# Patient Record
Sex: Female | Born: 1965
Health system: Southern US, Community
[De-identification: ages and names within clinical notes are randomized; demographics above are authoritative.]

## PROBLEM LIST (undated history)

## (undated) DIAGNOSIS — R0683 Snoring: Secondary | ICD-10-CM

## (undated) DIAGNOSIS — G510 Bell's palsy: Secondary | ICD-10-CM

## (undated) DIAGNOSIS — M069 Rheumatoid arthritis, unspecified: Secondary | ICD-10-CM

## (undated) DIAGNOSIS — G4730 Sleep apnea, unspecified: Secondary | ICD-10-CM

## (undated) DIAGNOSIS — M1990 Unspecified osteoarthritis, unspecified site: Secondary | ICD-10-CM

## (undated) DIAGNOSIS — B019 Varicella without complication: Secondary | ICD-10-CM

## (undated) DIAGNOSIS — I493 Ventricular premature depolarization: Secondary | ICD-10-CM

## (undated) DIAGNOSIS — I10 Essential (primary) hypertension: Secondary | ICD-10-CM

## (undated) HISTORY — DX: Varicella without complication: B01.9

## (undated) HISTORY — DX: Unspecified osteoarthritis, unspecified site: M19.90

## (undated) HISTORY — PX: FOOT SURGERY: SHX648

## (undated) HISTORY — DX: Ventricular premature depolarization: I49.3

## (undated) HISTORY — DX: Bell's palsy: G51.0

## (undated) HISTORY — DX: Essential (primary) hypertension: I10

## (undated) HISTORY — DX: Sleep apnea, unspecified: G47.30

## (undated) HISTORY — DX: Snoring: R06.83

## (undated) HISTORY — PX: BREAST BIOPSY: SHX20

## (undated) HISTORY — DX: Rheumatoid arthritis, unspecified: M06.9

---

## 2004-10-25 ENCOUNTER — Encounter: Admission: RE | Admit: 2004-10-25 | Discharge: 2004-10-25 | Payer: Self-pay | Admitting: Obstetrics and Gynecology

## 2005-10-30 ENCOUNTER — Encounter: Admission: RE | Admit: 2005-10-30 | Discharge: 2005-10-30 | Payer: Self-pay | Admitting: Obstetrics and Gynecology

## 2006-11-03 ENCOUNTER — Encounter: Admission: RE | Admit: 2006-11-03 | Discharge: 2006-11-03 | Payer: Self-pay | Admitting: Obstetrics and Gynecology

## 2007-09-08 ENCOUNTER — Ambulatory Visit (HOSPITAL_BASED_OUTPATIENT_CLINIC_OR_DEPARTMENT_OTHER): Admission: RE | Admit: 2007-09-08 | Discharge: 2007-09-08 | Payer: Self-pay | Admitting: Obstetrics and Gynecology

## 2007-11-04 ENCOUNTER — Ambulatory Visit (HOSPITAL_BASED_OUTPATIENT_CLINIC_OR_DEPARTMENT_OTHER): Admission: RE | Admit: 2007-11-04 | Discharge: 2007-11-04 | Payer: Self-pay | Admitting: Obstetrics and Gynecology

## 2008-11-16 ENCOUNTER — Ambulatory Visit: Payer: Self-pay | Admitting: Radiology

## 2008-11-16 ENCOUNTER — Ambulatory Visit (HOSPITAL_BASED_OUTPATIENT_CLINIC_OR_DEPARTMENT_OTHER): Admission: RE | Admit: 2008-11-16 | Discharge: 2008-11-16 | Payer: Self-pay | Admitting: Obstetrics and Gynecology

## 2009-11-23 ENCOUNTER — Ambulatory Visit (HOSPITAL_BASED_OUTPATIENT_CLINIC_OR_DEPARTMENT_OTHER): Admission: RE | Admit: 2009-11-23 | Discharge: 2009-11-23 | Payer: Self-pay | Admitting: Obstetrics and Gynecology

## 2009-11-23 ENCOUNTER — Ambulatory Visit: Payer: Self-pay | Admitting: Diagnostic Radiology

## 2010-08-31 ENCOUNTER — Other Ambulatory Visit: Payer: Self-pay | Admitting: Obstetrics and Gynecology

## 2010-08-31 ENCOUNTER — Other Ambulatory Visit (HOSPITAL_COMMUNITY)
Admission: RE | Admit: 2010-08-31 | Discharge: 2010-08-31 | Disposition: A | Discharge status: Home / Self Care | Payer: 59 | Source: Ambulatory Visit | Attending: Obstetrics and Gynecology | Admitting: Obstetrics and Gynecology

## 2010-08-31 DIAGNOSIS — Z1231 Encounter for screening mammogram for malignant neoplasm of breast: Secondary | ICD-10-CM

## 2010-08-31 DIAGNOSIS — Z1159 Encounter for screening for other viral diseases: Secondary | ICD-10-CM | POA: Insufficient documentation

## 2010-08-31 DIAGNOSIS — Z01419 Encounter for gynecological examination (general) (routine) without abnormal findings: Secondary | ICD-10-CM | POA: Insufficient documentation

## 2010-11-26 ENCOUNTER — Ambulatory Visit (HOSPITAL_BASED_OUTPATIENT_CLINIC_OR_DEPARTMENT_OTHER)
Admission: RE | Admit: 2010-11-26 | Discharge: 2010-11-26 | Disposition: A | Discharge status: Home / Self Care | Payer: 59 | Source: Ambulatory Visit | Attending: Obstetrics and Gynecology | Admitting: Obstetrics and Gynecology

## 2010-11-26 DIAGNOSIS — Z1231 Encounter for screening mammogram for malignant neoplasm of breast: Secondary | ICD-10-CM

## 2016-08-07 ENCOUNTER — Encounter: Payer: Self-pay | Admitting: Family Medicine

## 2016-08-07 ENCOUNTER — Ambulatory Visit (INDEPENDENT_AMBULATORY_CARE_PROVIDER_SITE_OTHER): Payer: 59 | Admitting: Family Medicine

## 2016-08-07 VITALS — BP 120/70 | HR 72 | Resp 12 | Ht 62.0 in | Wt 119.2 lb

## 2016-08-07 DIAGNOSIS — M069 Rheumatoid arthritis, unspecified: Secondary | ICD-10-CM

## 2016-08-07 DIAGNOSIS — M79605 Pain in left leg: Secondary | ICD-10-CM | POA: Diagnosis not present

## 2016-08-07 DIAGNOSIS — B370 Candidal stomatitis: Secondary | ICD-10-CM | POA: Diagnosis not present

## 2016-08-07 DIAGNOSIS — R682 Dry mouth, unspecified: Secondary | ICD-10-CM | POA: Diagnosis not present

## 2016-08-07 DIAGNOSIS — I10 Essential (primary) hypertension: Secondary | ICD-10-CM

## 2016-08-07 LAB — BASIC METABOLIC PANEL
BUN: 21 mg/dL (ref 6–23)
CHLORIDE: 103 meq/L (ref 96–112)
CO2: 28 meq/L (ref 19–32)
Calcium: 10 mg/dL (ref 8.4–10.5)
Creatinine, Ser: 0.56 mg/dL (ref 0.40–1.20)
GFR: 120.92 mL/min (ref >60.00)
Glucose, Bld: 101 mg/dL — ABNORMAL HIGH (ref 70–99)
POTASSIUM: 4.2 meq/L (ref 3.5–5.1)
SODIUM: 138 meq/L (ref 135–145)

## 2016-08-07 MED ORDER — AMLODIPINE BESYLATE 5 MG PO TABS: 5.0000 mg | ORAL_TABLET | Freq: Every day | ORAL | 0 refills | Status: DC

## 2016-08-07 MED ORDER — NYSTATIN 100000 UNIT/ML MT SUSP
2.0000 mL | Freq: Four times a day (QID) | OROMUCOSAL | 1 refills | Status: AC
Start: 2016-08-07 — End: 2016-09-06

## 2016-08-07 NOTE — Progress Notes (Signed)
HPI:   [name] is a 51 y.o. female, who is here today to establish care.  Former PCP: Dr Manson Passey in [place]. She moved to the area about 3 weeks ago.  Last preventive routine visit: 05/2016, gyn routine exam.  Chronic medical problems: RA and HTN. She saw rheuma last in 05/2016 and had labs done. Pending trying to establish with her former rheuma here in [place], Dr Corliss Skains. Dx with RA at age 1, she is currently on Methotrexate and Enbrel, she also takes Aleve as needed.  HTN Dx in 05/2016 by her former gyn, it was initially attributed to OCP's. According to patient, BP was rechecked after stopping OCPs, still elevated at 162/102. She was started on amlodipine 5 mg daily. Denies severe/frequent headache, visual changes, chest pain, dyspnea, palpitation, claudication, focal weakness, or edema. She is following a low salt diet.   She is not exercising regularly.   She is reporting negative renal and liver U/S ordered by former PCP due to new onset of HTN.  She has been under some stress. States that her mother was supposed to move with her but due to some medical issues she is still in [place] in a SNF.  Concerns today:   Dry mouth for about 2 months, which she attributed to Amlodipine. She denies associated dry eyes or vaginal mucosa, or ulcers/oral lesions. She states that she "always" has been a month breather. She denies associated nasal congestion, postnasal drainage, odynophagia, or dysphagia. She reports history of mildly enlarged thyroid gland, evaluated by ENT and negative workup that included thyroid imaging.   -2 weeks of calf achy at night,mild,better with changing positions. She denies extremity edema, erythema, numbness, tingling, or color changes. She has occasional lower back pain, no radiated, unchanged for years. She attributes primarily to sleeping on a different mattress. She denies any symptoms during the day.   Review of Systems    Constitutional: Negative for activity change, appetite change, fatigue, fever and unexpected weight change.  HENT: Negative for facial swelling, mouth sores, nosebleeds, sore throat, trouble swallowing and voice change.   Eyes: Negative for redness and visual disturbance.  Respiratory: Negative for cough, shortness of breath and wheezing.   Cardiovascular: Negative for chest pain, palpitations and leg swelling.  Gastrointestinal: Negative for abdominal pain, nausea and vomiting.       Negative for changes in bowel habits.  Endocrine: Negative for cold intolerance, heat intolerance, polydipsia, polyphagia and polyuria.  Genitourinary: Negative for decreased urine volume and hematuria.  Musculoskeletal: Positive for arthralgias and myalgias. Negative for gait problem and joint swelling.  Skin: Negative for color change and rash.  Neurological: Negative for syncope, weakness, numbness and headaches.  Hematological: Negative for adenopathy. Does not bruise/bleed easily.  Psychiatric/Behavioral: Negative for confusion and sleep disturbance. The patient is not nervous/anxious.     No current outpatient prescriptions on file prior to visit.   No current facility-administered medications on file prior to visit.     Past Medical History:  Diagnosis Date  . Arthritis    RA Dx at age 27  . Bell's palsy   . Chicken pox   . Hypertension    Not on File  Family History  Problem Relation Age of Onset  . Arthritis Mother   . Diabetes Neg Hx   . Hypertension Neg Hx   . Hyperlipidemia Neg Hx   . Stroke Neg Hx     Social History   Social History  .  Marital status: Married    Spouse name: N/A  . Number of children: N/A  . Years of education: N/A   Social History Main Topics  . Smoking status: Never Smoker  . Smokeless tobacco: Never Used  . Alcohol use Yes  . Drug use: No  . Sexual activity: Not Asked   Other Topics Concern  . None   Social History Narrative  . None     Vitals:   08/07/16 1040  BP: 120/70  Pulse: 72  Resp: 12   Body mass index is 21.81 kg/m.   Physical Exam  Nursing note and vitals reviewed. Constitutional: She is oriented to person, place, and time. She appears well-developed. No distress.  HENT:  Head: Atraumatic.  Mouth/Throat: Oropharynx is clear and moist. Mucous membranes are dry.  Whitish areas on tongue and soft palate, mild erythema.  Eyes: Conjunctivae and EOM are normal. Pupils are equal, round, and reactive to light.  Neck: No tracheal deviation present. No thyroid mass and no thyromegaly (palpable) present.  Cardiovascular: Normal rate and regular rhythm.   No murmur heard. Pulses:      Dorsalis pedis pulses are 2+ on the right side, and 2+ on the left side.  Respiratory: Effort normal and breath sounds normal. No respiratory distress.  GI: Soft. She exhibits no mass. There is no hepatomegaly. There is no tenderness.  Musculoskeletal: She exhibits no edema or tenderness.       Lumbar back: She exhibits no tenderness and no bony tenderness.  No pain upon palpation of calves, no signs of synovitis.   Lymphadenopathy:    She has no cervical adenopathy.  Neurological: She is alert and oriented to person, place, and time. She has normal strength. Coordination normal.  Skin: Skin is warm. No erythema.  Psychiatric: She has a normal mood and affect.  Well groomed, good eye contact.     ASSESSMENT AND PLAN:   Ramon was seen today for establish care.  Diagnoses and all orders for this visit:   Oral thrush  Mild. Topical Nystatin recommended. Follow-up as needed.  -     nystatin (MYCOSTATIN) 100000 UNIT/ML suspension; Take 2 mLs (200,000 Units total) by mouth 4 (four) times daily.  Rheumatoid arthritis, involving unspecified site, unspecified rheumatoid factor presence [facility])  Hospital symptoms are well controlled, no changes in current management.  Referral for Dr Corliss Skains was placed today.  -      Ambulatory referral to Rheumatology  Hypertension, essential  Adequately controlled. No changes in current management.   We discussed some side effects of NSAIDs. Continue monitoring BP at home. DASH-low salt diet to continue. Eye exam current. F/U in 4 months, before if needed.  -     amLODipine (NORVASC) 5 MG tablet; Take 1 tablet (5 mg total) by mouth daily. -     Basic metabolic panel  Pain of left lower extremity  Examination today do not suggest a serious process, so monitor for now.  We discussed possible causes: Electrolyte abnormality, musculoskeletal, radicular, and RLS among some. Further recommendations will be given according to lab results.  Dry mouth, unspecified  Review some side effects of Amlodipine, explained to try mouth is not a common one. If symptom does not improve after oral thrush resolves, she could try decreasing Amlodipine to 2.5 mg and see if it helps. Adequate hydration. We discussed other possible etiologies, recommend also addressing problem with rheumatologist. Follow-up as needed.    Lyzbeth Genrich G. [place], MD  [facility]. [place]  office.

## 2016-08-07 NOTE — Patient Instructions (Signed)
A few things to remember from today's visit:   Oral thrush - Plan: nystatin (MYCOSTATIN) 100000 UNIT/ML suspension  Rheumatoid arthritis, involving unspecified site, unspecified rheumatoid factor presence (HCC) - Plan: methotrexate (RHEUMATREX) 2.5 MG tablet, etanercept (ENBREL) 50 MG/ML injection, Ambulatory referral to Rheumatology  Hypertension, essential - Plan: amLODipine (NORVASC) 5 MG tablet, Basic metabolic panel  Blood pressure goal for most people is less than 140/90.   Most recent cardiologists' recommendations recommend blood pressure at or less than 130/80.   Elevated blood pressure increases the risk of strokes, heart and kidney disease, and eye problems. Regular physical activity and a healthy diet (DASH diet) usually help. Low salt diet. Take medications as instructed.  Caution with some over the counter medications as cold medications, dietary products (for weight loss), and Ibuprofen or Aleve (frequent use);all these medications could cause elevation of blood pressure.   Please be sure medication list is accurate. If a new problem present, please set up appointment sooner than planned today.

## 2016-08-14 ENCOUNTER — Telehealth: Payer: Self-pay | Admitting: Family Medicine

## 2016-08-14 NOTE — Telephone Encounter (Signed)
Opened in error

## 2016-10-24 DIAGNOSIS — M7732 Calcaneal spur, left foot: Secondary | ICD-10-CM | POA: Insufficient documentation

## 2016-10-24 DIAGNOSIS — M06321 Rheumatoid nodule, right elbow: Secondary | ICD-10-CM | POA: Insufficient documentation

## 2016-10-24 DIAGNOSIS — R682 Dry mouth, unspecified: Secondary | ICD-10-CM | POA: Insufficient documentation

## 2016-10-24 DIAGNOSIS — Z79899 Other long term (current) drug therapy: Secondary | ICD-10-CM | POA: Insufficient documentation

## 2016-10-24 DIAGNOSIS — M06322 Rheumatoid nodule, left elbow: Secondary | ICD-10-CM | POA: Insufficient documentation

## 2016-10-24 DIAGNOSIS — M7731 Calcaneal spur, right foot: Secondary | ICD-10-CM | POA: Insufficient documentation

## 2016-10-24 DIAGNOSIS — Z8679 Personal history of other diseases of the circulatory system: Secondary | ICD-10-CM | POA: Insufficient documentation

## 2016-10-24 NOTE — Progress Notes (Signed)
Office Visit Note  Patient: Vickie [name]             Date of Birth: July 29, 1965           MRN: [id number]             PCP: [place], Betty G, MD Referring: [place], Betty G, MD Visit Date: 10/29/2016 Occupation: @GUAROCC @    Subjective:  Pain in hands.   History of Present Illness: Vickie [name] is a 51 y.o. female with history of rheumatoid arthritis. Patient states that she developed symptoms of rheumatoid arthritis in January 1997 with joint pain and swelling. While she was living in [place] she was seen by her rheumatologist who diagnosed her with rheumatoid arthritis in October 1997. She was started on prednisone and Bextra. Gradually she was switched to Celebrex. She came off all the medications as she became pregnant. She was asymptomatic during the pregnancy until the third trimester when she started having flares again. In the postpartum period she had increase flares. At the time she went back on methotrexate and Enbrel was added. In 2008 she moved to [facility] when she was under my care until up to 6 years ago. She states that she continued methotrexate and Enbrel and did quite well. After she moved to [facility] there are she stayed on the same treatment. She states she just has 2 major flares per year for which she has to take prednisone taper. She also has intermittent mild flares. She moved to [facility] about 3 months ago and in the process due to increased distress she's been having increased flares. She reports increased joint pain and swelling in her hands currently. None of the other joints are painful.  Activities of Daily Living:  Patient reports morning stiffness for 1 hour.   Patient Reports nocturnal pain.  Difficulty dressing/grooming: Denies Difficulty climbing stairs: Denies Difficulty getting out of chair: Denies Difficulty using hands for taps, buttons, cutlery, and/or writing: Reports   Review of Systems  Constitutional: Negative for fatigue, night  sweats, weight gain, weight loss and weakness.  HENT: Negative for mouth sores, trouble swallowing, trouble swallowing, mouth dryness and nose dryness.   Eyes: Negative for pain, redness, visual disturbance and dryness.  Respiratory: Negative for cough, shortness of breath and difficulty breathing.   Cardiovascular: Negative for chest pain, palpitations, hypertension, irregular heartbeat and swelling in legs/feet.  Gastrointestinal: Negative for blood in stool, constipation and diarrhea.  Endocrine: Negative for increased urination.  Genitourinary: Negative for vaginal dryness.  Musculoskeletal: Positive for arthralgias, joint pain, joint swelling and morning stiffness. Negative for myalgias, muscle weakness, muscle tenderness and myalgias.  Skin: Negative for color change, rash, hair loss, skin tightness, ulcers and sensitivity to sunlight.  Allergic/Immunologic: Negative for susceptible to infections.  Neurological: Negative for dizziness, memory loss and night sweats.  Hematological: Negative for swollen glands.  Psychiatric/Behavioral: Negative for depressed mood and sleep disturbance. The patient is nervous/anxious.     PMFS History:  Patient Active Problem List   Diagnosis Date Noted  . High risk medication use 10/24/2016  . Rheumatoid nodule, left elbow (HCC) 10/24/2016  . Rheumatoid nodule, right elbow (HCC) 10/24/2016  . Calcaneal spur of both feet 10/24/2016  . Dry mouth 10/24/2016  . History of hypertension 10/24/2016  . Rheumatoid arthritis (HCC) 08/07/2016  . Hypertension, essential 08/07/2016    Past Medical History:  Diagnosis Date  . Arthritis    RA Dx at age 71  . Bell's palsy   .  Chicken pox   . Hypertension     Family History  Problem Relation Age of Onset  . Arthritis Mother   . Diabetes Neg Hx   . Hypertension Neg Hx   . Hyperlipidemia Neg Hx   . Stroke Neg Hx    Past Surgical History:  Procedure Laterality Date  . CESAREAN SECTION    . FOOT SURGERY      Social History   Social History Narrative  . No narrative on file     Objective: Vital Signs: BP 124/62   Pulse 74   Resp 14   Ht 5\' 2"  (1.575 m)   Wt 120 lb (54.4 kg)   BMI 21.95 kg/m    Physical Exam  Constitutional: She is oriented to person, place, and time. She appears well-developed and well-nourished.  HENT:  Head: Normocephalic and atraumatic.  Eyes: Conjunctivae and EOM are normal.  Neck: Normal range of motion.  Cardiovascular: Normal rate, regular rhythm, normal Vickie sounds and intact distal pulses.   Pulmonary/Chest: Effort normal and breath sounds normal.  Abdominal: Soft. Bowel sounds are normal.  Lymphadenopathy:    She has no cervical adenopathy.  Neurological: She is alert and oriented to person, place, and time.  Skin: Skin is warm and dry. Capillary refill takes less than 2 seconds.  Psychiatric: She has a normal mood and affect. Her behavior is normal.  Nursing note and vitals reviewed.    Musculoskeletal Exam: C-spine and thoracic lumbar spine good range of motion. No SI joint tenderness. Shoulder joints elbow joints are good range of motion. She has some limitation of range of motion in her bilateral wrist joints with extension of 40 and flexion of 45. She has some thickening of ulnar styloid and also thickening of bilateral second and third MCP joints with possible synovitis. Hip joints knee joints ankles were good wrist range of motion. She had postsurgical changes over bilateral fifth MTP joints. She had callus palpable over her bilateral second MTP on the plantar aspect.  CDAI Exam: CDAI Homunculus Exam:   Tenderness:  RUE: wrist LUE: wrist Right hand: 2nd MCP and 3rd MCP Left hand: 2nd MCP and 3rd MCP  Joint Counts:  CDAI Tender Joint count: 6 CDAI Swollen Joint count: 0  Global Assessments:  Patient Global Assessment: 2 Provider Global Assessment: 2  CDAI Calculated Score: 10    Investigation: Findings:  08/08/2015 Hep B  Core Ab  non reactive, Hep B surface Ab non reactive, HCV Ab non reactive, TB gold negative 09/26/2015 TSH normal, Lipid Panel normal,  04/24/2016 CBC normal,  Hepatic Function panel normal  CMP Latest Ref Rng & Units 08/07/2016  Glucose 70 - 99 mg/dL 08/09/2016)  BUN 6 - 23 mg/dL 21  Creatinine 476(L - 4.65 mg/dL 0.35  Sodium 4.65 - 681 mEq/L 138  Potassium 3.5 - 5.1 mEq/L 4.2  Chloride 96 - 112 mEq/L 103  CO2 19 - 32 mEq/L 28  Calcium 8.4 - 10.5 mg/dL 275       Imaging: Xr Foot 2 Views Left  Result Date: 10/29/2016 First MTP narrowing was noted. Possible erosive changes were noted in third fourth and fifth MTP joints. For surgical change and fifth MTP joint was noted. The screw was noted and fifth metatarsal. PIP/DIP narrowing was noted. Juxta-articular osteopenia was noted. Subtalar joint space narrowing was noted. A small calcaneal spur was noted. Impression: These findings are consistent with rheumatoid arthritis.  Xr Foot 2 Views Right  Result Date: 10/29/2016 First and  fifth MTP joint narrowing was noted. All PIP joint and DIP joint narrowing was noted. The screw was noted in the fifth metatarsal. Juxta articular osteopenia was noted. Calcaneal spur was noted. Subtalar joint space was noted. Impression: These findings are consistent with rheumatoid arthritis.  Xr Hand 2 View Left  Result Date: 10/29/2016 Juxta articular osteopenia noted. Narrowing of first second and third MCP joints noted erosive changes and first second and third MCP joints noted metacarpocarpal, severe intercarpal and radiocarpal narrowing was noted. Erosive changes were noted in the carpal joints and radius. Impression: These findings are consistent with severe rheumatoid arthritis with erosive disease.  Xr Hand 2 View Right  Result Date: 10/29/2016 Juxta articular osteopenia noted . First, second and third MCP joint narrowing noted. Second MCP has severe narrowing with invagination and subluxation. Erosive changes  noted in second and third MCP joints. PIP joint narrowing was noted. Severe metacarpal intercarpal and radiocarpal joint space narrowing was noted. Erosive changes were noted in the carpal joints. Impression: These findings are consistent with rheumatoid arthritis severe disease.   Speciality Comments: No specialty comments available.    Procedures:  No procedures performed Allergies: Patient has no known allergies.   Assessment / Plan:     Visit Diagnoses: Rheumatoid arthritis involving multiple sites with positive rheumatoid factor +CCP erosive disease (HCC) -she continues to have intermittent swelling and discomfort in her bilateral hands. She does have synovial thickening versus synovitis in her bilateral MCPs today. I'll obtain following x-rays today. Plan: XR Hand 2 View Left, XR Hand 2 View Right, XR Foot 2 Views Left, XR Foot 2 Views Right  High risk medication use - Methotrexate 8 tablets by mouth every week, folic acid 2 mg by mouth daily and Enbrel 50 mg subcutaneous every week and Mobic 15 mg by mouth daily when necessary  Dry mouth: Over-the-counter products have been helpful.  Rheumatoid nodule, left elbow (HCC): She has a palpable nodule over her left elbow  She had surgery on bilateral feet for bilateral fifth MTP pain. She has some calluses in her feet but they're not painful currently.  Calcaneal spur of both feet  History of hypertension    Orders: Orders Placed This Encounter  Procedures  . XR Hand 2 View Left  . XR Hand 2 View Right  . XR Foot 2 Views Left  . XR Foot 2 Views Right  . DG Chest 2 View  . CBC with Differential/Platelet  . COMPLETE METABOLIC PANEL WITH GFR  . Quantiferon tb gold assay (blood)  . HIV antibody  . Sedimentation rate  . Rheumatoid factor  . Cyclic citrul peptide antibody, IgG  . Serum protein electrophoresis with reflex  . IgG, IgA, IgM  . Urinalysis, Routine w reflex microscopic  . CBC with Differential/Platelet  . COMPLETE  METABOLIC PANEL WITH GFR   No orders of the defined types were placed in this encounter.   Face-to-face time spent with patient was 45 minutes. 50% of time was spent in counseling and coordination of care.  Follow-Up Instructions: Return for Rheumatoid arthritis.   Pollyann Savoy, MD  Note - This record has been created using Animal nutritionist.  Chart creation errors have been sought, but may not always  have been located. Such creation errors do not reflect on  the standard of medical care.

## 2016-10-29 ENCOUNTER — Encounter (INDEPENDENT_AMBULATORY_CARE_PROVIDER_SITE_OTHER): Payer: Self-pay

## 2016-10-29 ENCOUNTER — Encounter: Payer: Self-pay | Admitting: Rheumatology

## 2016-10-29 ENCOUNTER — Ambulatory Visit (INDEPENDENT_AMBULATORY_CARE_PROVIDER_SITE_OTHER): Payer: 59

## 2016-10-29 ENCOUNTER — Ambulatory Visit (INDEPENDENT_AMBULATORY_CARE_PROVIDER_SITE_OTHER): Payer: 59 | Admitting: Rheumatology

## 2016-10-29 ENCOUNTER — Ambulatory Visit (HOSPITAL_COMMUNITY)
Admission: RE | Admit: 2016-10-29 | Discharge: 2016-10-29 | Disposition: A | Discharge status: Home / Self Care | Payer: 59 | Source: Ambulatory Visit | Attending: Rheumatology | Admitting: Rheumatology

## 2016-10-29 ENCOUNTER — Ambulatory Visit (INDEPENDENT_AMBULATORY_CARE_PROVIDER_SITE_OTHER): Payer: Self-pay

## 2016-10-29 VITALS — BP 124/62 | HR 74 | Resp 14 | Ht 62.0 in | Wt 120.0 lb

## 2016-10-29 DIAGNOSIS — Z111 Encounter for screening for respiratory tuberculosis: Secondary | ICD-10-CM

## 2016-10-29 DIAGNOSIS — Z79899 Other long term (current) drug therapy: Secondary | ICD-10-CM

## 2016-10-29 DIAGNOSIS — M7731 Calcaneal spur, right foot: Secondary | ICD-10-CM | POA: Diagnosis not present

## 2016-10-29 DIAGNOSIS — M0579 Rheumatoid arthritis with rheumatoid factor of multiple sites without organ or systems involvement: Secondary | ICD-10-CM

## 2016-10-29 DIAGNOSIS — M7732 Calcaneal spur, left foot: Secondary | ICD-10-CM

## 2016-10-29 DIAGNOSIS — Z8679 Personal history of other diseases of the circulatory system: Secondary | ICD-10-CM

## 2016-10-29 DIAGNOSIS — M2550 Pain in unspecified joint: Secondary | ICD-10-CM | POA: Diagnosis not present

## 2016-10-29 DIAGNOSIS — M06322 Rheumatoid nodule, left elbow: Secondary | ICD-10-CM | POA: Diagnosis not present

## 2016-10-29 DIAGNOSIS — R682 Dry mouth, unspecified: Secondary | ICD-10-CM | POA: Diagnosis not present

## 2016-10-29 DIAGNOSIS — M06321 Rheumatoid nodule, right elbow: Secondary | ICD-10-CM

## 2016-10-29 NOTE — Progress Notes (Signed)
Chest x-ray normal

## 2016-10-29 NOTE — Patient Instructions (Addendum)
Go to [facility] ([address], [place]) for your chest xray, and check with your primary care to make sure you have had your pneumonia vaccines, and there is a new shingles vaccine which is not live, called Shingrix, you should get this vaccine if you have not had it yet.      What is this medicine? ETANERCEPT (et a [employer]) is used for the treatment of rheumatoid arthritis in adults and children. The medicine is also used to treat psoriatic arthritis, ankylosing spondylitis, and psoriasis. This medicine may be used for other purposes; ask your health care provider or pharmacist if you have questions. COMMON BRAND NAME(S): Enbrel What should I tell my health care provider before I take this medicine? They need to know if you have any of these conditions: -blood disorders -cancer -congestive heart failure -diabetes -exposure to chickenpox -immune system problems -infection -multiple sclerosis -seizure disorder -tuberculosis, a positive skin test for tuberculosis or have recently been in close contact with someone who has tuberculosis -Wegener's granulomatosis -an unusual or allergic reaction to etanercept, latex, other medicines, foods, dyes, or preservatives -pregnant or trying to get pregnant -breast-feeding How should I use this medicine? The medicine is given by injection under the skin. You will be taught how to prepare and give this medicine. Use exactly as directed. Take your medicine at regular intervals. Do not take your medicine more often than directed. It is important that you put your used needles and syringes in a special sharps container. Do not put them in a trash can. If you do not have a sharps container, call your pharmacist or healthcare provider to get one. A special MedGuide will be given to you by the pharmacist with each prescription and refill. Be sure to read this information carefully each time. Talk to your pediatrician regarding the  use of this medicine in children. While this drug may be prescribed for children as young as 89 years of age for selected conditions, precautions do apply. Overdosage: If you think you have taken too much of this medicine contact a poison control center or emergency room at once. NOTE: This medicine is only for you. Do not share this medicine with others. What if I miss a dose? If you miss a dose, contact your health care professional to find out when you should take your next dose. Do not take double or extra doses without advice. What may interact with this medicine? Do not take this medicine with any of the following medications: -anakinra This medicine may also interact with the following medications: -cyclophosphamide -sulfasalazine -vaccines This list may not describe all possible interactions. Give your health care provider a list of all the medicines, herbs, non-prescription drugs, or dietary supplements you use. Also tell them if you smoke, drink alcohol, or use illegal drugs. Some items may interact with your medicine. What should I watch for while using this medicine? Tell your doctor or healthcare professional if your symptoms do not start to get better or if they get worse. You will be tested for tuberculosis (TB) before you start this medicine. If your doctor prescribes any medicine for TB, you should start taking the TB medicine before starting this medicine. Make sure to finish the full course of TB medicine. Call your doctor or health care professional for advice if you get a fever, chills or sore throat, or other symptoms of a cold or flu. Do not treat yourself. This drug decreases your body's ability to  fight infections. Try to avoid being around people who are sick. What side effects may I notice from receiving this medicine? Side effects that you should report to your doctor or health care professional as soon as possible: -allergic reactions like skin rash, itching or hives,  swelling of the face, lips, or tongue -changes in vision -fever, chills or any other sign of infection -numbness or tingling in legs or other parts of the body -red, scaly patches or raised bumps on the skin -shortness of breath or difficulty breathing -swollen lymph nodes in the neck, underarm, or groin areas -unexplained weight loss -unusual bleeding or bruising -unusual swelling or fluid retention in the legs -unusually weak or tired Side effects that usually do not require medical attention (report to your doctor or health care professional if they continue or are bothersome): -dizziness -headache -nausea -redness, itching, or swelling at the injection site -vomiting This list may not describe all possible side effects. Call your doctor for medical advice about side effects. You may report side effects to FDA at 1-800-FDA-1088. Where should I keep my medicine? Keep out of the reach of children. Store between 2 and 8 degrees C (36 and 46 degrees F). Do not freeze or shake. Protect from light. Throw away any unused medicine after the expiration date. You will be instructed on how to store this medicine. NOTE: This sheet is a summary. It may not cover all possible information. If you have questions about this medicine, talk to your doctor, pharmacist, or health care provider.  2018 Elsevier/Gold Standard (2011-08-19 15:33:36) Methotrexate tablets What is this medicine? METHOTREXATE (METH oh TREX ate) is a chemotherapy drug used to treat cancer including breast cancer, leukemia, and lymphoma. This medicine can also be used to treat psoriasis and certain kinds of arthritis. This medicine may be used for other purposes; ask your health care provider or pharmacist if you have questions. COMMON BRAND NAME(S): Rheumatrex, Trexall What should I tell my health care provider before I take this medicine? They need to know if you have any of these conditions: -fluid in the stomach area or  lungs -if you often drink alcohol -infection or immune system problems -kidney disease or on hemodialysis -liver disease -low blood counts, like low white cell, platelet, or red cell counts -lung disease -radiation therapy -stomach ulcers -ulcerative colitis -an unusual or allergic reaction to methotrexate, other medicines, foods, dyes, or preservatives -pregnant or trying to get pregnant -breast-feeding How should I use this medicine? Take this medicine by mouth with a glass of water. Follow the directions on the prescription label. Take your medicine at regular intervals. Do not take it more often than directed. Do not stop taking except on your doctor's advice. Make sure you know why you are taking this medicine and how often you should take it. If this medicine is used for a condition that is not cancer, like arthritis or psoriasis, it should be taken weekly, NOT daily. Taking this medicine more often than directed can cause serious side effects, even death. Talk to your healthcare provider about safe handling and disposal of this medicine. You may need to take special precautions. Talk to your pediatrician regarding the use of this medicine in children. While this drug may be prescribed for selected conditions, precautions do apply. Overdosage: If you think you have taken too much of this medicine contact a poison control center or emergency room at once. NOTE: This medicine is only for you. Do not share this medicine with  others. What if I miss a dose? If you miss a dose, talk with your doctor or health care professional. Do not take double or extra doses. What may interact with this medicine? This medicine may interact with the following medication: -acitretin -aspirin and aspirin-like medicines including salicylates -azathioprine -certain antibiotics like penicillins, tetracycline, and chloramphenicol -cyclosporine -gold -hydroxychloroquine -live virus vaccines -NSAIDs,  medicines for pain and inflammation, like ibuprofen or naproxen -other cytotoxic agents -penicillamine -phenylbutazone -phenytoin -probenecid -retinoids such as isotretinoin and tretinoin -steroid medicines like prednisone or cortisone -sulfonamides like sulfasalazine and trimethoprim/sulfamethoxazole -theophylline This list may not describe all possible interactions. Give your health care provider a list of all the medicines, herbs, non-prescription drugs, or dietary supplements you use. Also tell them if you smoke, drink alcohol, or use illegal drugs. Some items may interact with your medicine. What should I watch for while using this medicine? Avoid alcoholic drinks. This medicine can make you more sensitive to the sun. Keep out of the sun. If you cannot avoid being in the sun, wear protective clothing and use sunscreen. Do not use sun lamps or tanning beds/booths. You may need blood work done while you are taking this medicine. Call your doctor or health care professional for advice if you get a fever, chills or sore throat, or other symptoms of a cold or flu. Do not treat yourself. This drug decreases your body's ability to fight infections. Try to avoid being around people who are sick. This medicine may increase your risk to bruise or bleed. Call your doctor or health care professional if you notice any unusual bleeding. Check with your doctor or health care professional if you get an attack of severe diarrhea, nausea and vomiting, or if you sweat a lot. The loss of too much body fluid can make it dangerous for you to take this medicine. Talk to your doctor about your risk of cancer. You may be more at risk for certain types of cancers if you take this medicine. Both men and women must use effective birth control with this medicine. Do not become pregnant while taking this medicine or until at least 1 normal menstrual cycle has occurred after stopping it. Women should inform their doctor if  they wish to become pregnant or think they might be pregnant. Men should not father a child while taking this medicine and for 3 months after stopping it. There is a potential for serious side effects to an unborn child. Talk to your health care professional or pharmacist for more information. Do not breast-feed an infant while taking this medicine. What side effects may I notice from receiving this medicine? Side effects that you should report to your doctor or health care professional as soon as possible: -allergic reactions like skin rash, itching or hives, swelling of the face, lips, or tongue -breathing problems or shortness of breath -diarrhea -dry, nonproductive cough -low blood counts - this medicine may decrease the number of white blood cells, red blood cells and platelets. You may be at increased risk for infections and bleeding. -mouth sores -redness, blistering, peeling or loosening of the skin, including inside the mouth -signs of infection - fever or chills, cough, sore throat, pain or trouble passing urine -signs and symptoms of bleeding such as bloody or black, tarry stools; red or dark-brown urine; spitting up blood or brown material that looks like coffee grounds; red spots on the skin; unusual bruising or bleeding from the eye, gums, or nose -signs and symptoms of kidney  injury like trouble passing urine or change in the amount of urine -signs and symptoms of liver injury like dark yellow or brown urine; general ill feeling or flu-like symptoms; light-colored stools; loss of appetite; nausea; right upper belly pain; unusually weak or tired; yellowing of the eyes or skin Side effects that usually do not require medical attention (report to your doctor or health care professional if they continue or are bothersome): -dizziness -hair loss -tiredness -upset stomach -vomiting This list may not describe all possible side effects. Call your doctor for medical advice about side  effects. You may report side effects to FDA at 1-800-FDA-1088. Where should I keep my medicine? Keep out of the reach of children. Store at room temperature between 20 and 25 degrees C (68 and 77 degrees F). Protect from light. Throw away any unused medicine after the expiration date. NOTE: This sheet is a summary. It may not cover all possible information. If you have questions about this medicine, talk to your doctor, pharmacist, or health care provider.  2018 Elsevier/Gold Standard (2014-10-17 05:39:22)

## 2016-10-30 LAB — URINALYSIS, MICROSCOPIC ONLY
CASTS: NONE SEEN [LPF]
YEAST: NONE SEEN [HPF]

## 2016-10-30 LAB — COMPLETE METABOLIC PANEL WITH GFR
ALBUMIN: 4.2 g/dL (ref 3.6–5.1)
ALK PHOS: 78 U/L (ref 33–130)
ALT: 59 U/L — ABNORMAL HIGH (ref 6–29)
AST: 61 U/L — ABNORMAL HIGH (ref 10–35)
BILIRUBIN TOTAL: 0.6 mg/dL (ref 0.2–1.2)
BUN: 15 mg/dL (ref 7–25)
CO2: 27 mmol/L (ref 20–32)
Calcium: 9.3 mg/dL (ref 8.6–10.4)
Chloride: 101 mmol/L (ref 98–110)
Creat: 0.48 mg/dL — ABNORMAL LOW (ref 0.50–1.05)
Glucose, Bld: 94 mg/dL (ref 65–99)
Potassium: 3.7 mmol/L (ref 3.5–5.3)
Sodium: 135 mmol/L (ref 135–146)
TOTAL PROTEIN: 7.6 g/dL (ref 6.1–8.1)

## 2016-10-30 LAB — URINALYSIS, ROUTINE W REFLEX MICROSCOPIC
BILIRUBIN URINE: NEGATIVE
Glucose, UA: NEGATIVE
Ketones, ur: NEGATIVE
NITRITE: NEGATIVE
Protein, ur: NEGATIVE
SPECIFIC GRAVITY, URINE: 1.015 (ref 1.001–1.035)
pH: 5.5 (ref 5.0–8.0)

## 2016-10-30 LAB — CBC WITH DIFFERENTIAL/PLATELET
BASOS ABS: 0 {cells}/uL (ref 0–200)
Basophils Relative: 0 %
EOS ABS: 42 {cells}/uL (ref 15–500)
Eosinophils Relative: 1 %
HCT: 42 % (ref 35.0–45.0)
HEMOGLOBIN: 13.7 g/dL (ref 11.7–15.5)
LYMPHS ABS: 1260 {cells}/uL (ref 850–3900)
Lymphocytes Relative: 30 %
MCH: 30.7 pg (ref 27.0–33.0)
MCHC: 32.6 g/dL (ref 32.0–36.0)
MCV: 94.2 fL (ref 80.0–100.0)
MONOS PCT: 10 %
MPV: 10.5 fL (ref 7.5–12.5)
Monocytes Absolute: 420 cells/uL (ref 200–950)
NEUTROS ABS: 2478 {cells}/uL (ref 1500–7800)
NEUTROS PCT: 59 %
Platelets: 310 10*3/uL (ref 140–400)
RBC: 4.46 MIL/uL (ref 3.80–5.10)
RDW: 14.2 % (ref 11.0–15.0)
WBC: 4.2 10*3/uL (ref 3.8–10.8)

## 2016-10-30 LAB — QUANTIFERON TB GOLD ASSAY (BLOOD)
INTERFERON GAMMA RELEASE ASSAY: NEGATIVE
QUANTIFERON NIL VALUE: 0.21 [IU]/mL
Quantiferon Tb Ag Minus Nil Value: 0 IU/mL

## 2016-10-30 LAB — CYCLIC CITRUL PEPTIDE ANTIBODY, IGG: Cyclic Citrullin Peptide Ab: >250 Units — ABNORMAL HIGH

## 2016-10-30 LAB — IGG, IGA, IGM
IGA: 433 mg/dL (ref 81–463)
IgG (Immunoglobin G), Serum: 1932 mg/dL — ABNORMAL HIGH (ref 694–1618)
IgM, Serum: 314 mg/dL — ABNORMAL HIGH (ref 48–271)

## 2016-10-30 LAB — SEDIMENTATION RATE: Sed Rate: 42 mm/hr — ABNORMAL HIGH (ref 0–30)

## 2016-10-30 LAB — RHEUMATOID FACTOR: Rhuematoid fact SerPl-aCnc: >1000 IU/mL — ABNORMAL HIGH (ref <14)

## 2016-10-31 LAB — HIV ANTIBODY (ROUTINE TESTING W REFLEX): HIV 1&2 Ab, 4th Generation: NONREACTIVE

## 2016-11-01 LAB — PROTEIN ELECTROPHORESIS, SERUM, WITH REFLEX
ALBUMIN ELP: 4 g/dL (ref 3.8–4.8)
ALPHA-1-GLOBULIN: 0.3 g/dL (ref 0.2–0.3)
Alpha-2-Globulin: 0.7 g/dL (ref 0.5–0.9)
BETA 2: 0.5 g/dL (ref 0.2–0.5)
BETA GLOBULIN: 0.5 g/dL (ref 0.4–0.6)
Gamma Globulin: 1.9 g/dL — ABNORMAL HIGH (ref 0.8–1.7)
Total Protein, Serum Electrophoresis: 7.8 g/dL (ref 6.1–8.1)

## 2016-11-01 NOTE — Progress Notes (Signed)
Dc Mobic, elevated LFTs. UA does not appear clean catch.

## 2016-11-04 ENCOUNTER — Telehealth: Payer: Self-pay | Admitting: Radiology

## 2016-11-04 NOTE — Telephone Encounter (Signed)
-----   Message from Pollyann Savoy, MD sent at 11/01/2016  2:03 PM EDT ----- Dc Mobic, elevated LFTs. UA does not appear clean catch.

## 2016-11-04 NOTE — Telephone Encounter (Signed)
Called pt to advise LFTs elevated d/c Meloxicam she has voiced understanding

## 2016-11-10 ENCOUNTER — Other Ambulatory Visit: Payer: Self-pay | Admitting: Family Medicine

## 2016-11-10 DIAGNOSIS — I10 Essential (primary) hypertension: Secondary | ICD-10-CM

## 2016-11-14 ENCOUNTER — Encounter: Payer: Self-pay | Admitting: Family Medicine

## 2016-11-14 NOTE — Progress Notes (Signed)
Assessment / Plan:     Visit Diagnoses: Rheumatoid arthritis involving multiple sites with positive rheumatoid factor +CCP erosive disease (HCC) -she continues to have intermittent swelling and discomfort in her bilateral hands. She does have synovial thickening versus synovitis in her bilateral MCPs today. I'll obtain following x-rays today. Plan: XR Hand 2 View Left, XR Hand 2 View Right, XR Foot 2 Views Left, XR Foot 2 Views Right  High risk medication use - Methotrexate 8 tablets by mouth every week, folic acid 2 mg by mouth daily and Enbrel 50 mg subcutaneous every week and Mobic 15 mg by mouth daily when necessary  Dry mouth: Over-the-counter products have been helpful.  Rheumatoid nodule, left elbow (HCC): She has a palpable nodule over her left elbow  She had surgery on bilateral feet for bilateral fifth MTP pain. She has some calluses in her feet but they're not painful currently.  Calcaneal spur of both feet  History of hypertension   I discussed the ultrasound findings with the patient today. She's been experiencing increased pain and discomfort in her joints. She has done quite well on Enbrel for many years. We decided to proceed with Humira. We will apply for Humira.

## 2016-11-20 ENCOUNTER — Other Ambulatory Visit: Payer: Self-pay

## 2016-11-20 DIAGNOSIS — I10 Essential (primary) hypertension: Secondary | ICD-10-CM

## 2016-11-20 MED ORDER — AMLODIPINE BESYLATE 5 MG PO TABS: 5.0000 mg | ORAL_TABLET | Freq: Every day | ORAL | 1 refills | Status: DC

## 2016-11-26 NOTE — Progress Notes (Signed)
Office Visit Note  Patient: Vickie [name]             Date of Birth: January 10, 1966           MRN: [id number]             PCP: Martinique, Betty G, MD Referring: Martinique, Betty G, MD Visit Date: 11/28/2016 Occupation: '@GUAROCC'$ @    Subjective:  Stiffness and discomfort in multiple joints.   History of Present Illness: Vickie [name] is a 51 y.o. female with history of sero positive rheumatoid arthritis. She states recently she's been having increased discomfort and stiffness in her bilateral hands. She also has some discomfort in her bilateral feet. She came in for ultrasound yesterday which showed some synovitis. She has a rheumatoid nodule on her left elbow which is bothersome.  Activities of Daily Living:  Patient reports morning stiffness for 30 minutes.   Patient Denies nocturnal pain.  Difficulty dressing/grooming: Denies Difficulty climbing stairs: Denies Difficulty getting out of chair: Denies Difficulty using hands for taps, buttons, cutlery, and/or writing: Reports   Review of Systems  Constitutional: Positive for fatigue. Negative for night sweats, weight gain, weight loss and weakness.  HENT: Negative for mouth sores, trouble swallowing, trouble swallowing, mouth dryness and nose dryness.   Eyes: Negative for pain, redness, visual disturbance and dryness.  Respiratory: Negative for cough, shortness of breath and difficulty breathing.   Cardiovascular: Negative for chest pain, palpitations, hypertension, irregular heartbeat and swelling in legs/feet.  Gastrointestinal: Negative for blood in stool, constipation and diarrhea.  Endocrine: Negative for increased urination.  Genitourinary: Negative for vaginal dryness.  Musculoskeletal: Positive for arthralgias, joint pain and morning stiffness. Negative for joint swelling, myalgias, muscle weakness, muscle tenderness and myalgias.  Skin: Negative for color change, rash, hair loss, skin tightness, ulcers and sensitivity to sunlight.    Allergic/Immunologic: Negative for susceptible to infections.  Neurological: Negative for dizziness, memory loss and night sweats.  Hematological: Negative for swollen glands.  Psychiatric/Behavioral: Negative for depressed mood and sleep disturbance. The patient is not nervous/anxious.     PMFS History:  Patient Active Problem List   Diagnosis Date Noted  . High risk medication use 10/24/2016  . Rheumatoid nodule, left elbow ([place]) 10/24/2016  . Rheumatoid nodule, right elbow ([place]) 10/24/2016  . Calcaneal spur of both feet 10/24/2016  . Dry mouth 10/24/2016  . Rheumatoid arthritis ([place]) 08/07/2016  . Hypertension, essential 08/07/2016    Past Medical History:  Diagnosis Date  . Arthritis    RA Dx at age 60  . Bell's palsy   . Chicken pox   . Hypertension     Family History  Problem Relation Age of Onset  . Arthritis Mother   . Diabetes Neg Hx   . Hypertension Neg Hx   . Hyperlipidemia Neg Hx   . Stroke Neg Hx    Past Surgical History:  Procedure Laterality Date  . CESAREAN SECTION    . FOOT SURGERY     Social History   Social History Narrative  . No narrative on file     Objective: Vital Signs: BP 102/60   Pulse 68   Resp 16   Ht '5\' 1"'$  (1.549 m)   Wt 120 lb (54.4 kg)   BMI 22.67 kg/m    Physical Exam  Constitutional: She is oriented to person, place, and time. She appears well-developed and well-nourished.  HENT:  Head: Normocephalic and atraumatic.  Eyes: Conjunctivae and EOM are normal.  Neck: Normal  range of motion.  Cardiovascular: Normal rate, regular rhythm, normal heart sounds and intact distal pulses.   Pulmonary/Chest: Effort normal and breath sounds normal.  Abdominal: Soft. Bowel sounds are normal.  Lymphadenopathy:    She has no cervical adenopathy.  Neurological: She is alert and oriented to person, place, and time.  Skin: Skin is warm and dry. Capillary refill takes less than 2 seconds.  Psychiatric: She has a normal mood and affect.  Her behavior is normal.  Nursing note and vitals reviewed.    Musculoskeletal Exam: C-spine limited range of motion. thoracic and lumbar lumbar spine good range of motion  Shoulder joints although joints are good range of motion. She has limited range of motion in her bilateral wrist joints with synovial thickening. She has synovial thickening over all MCP joints with ulnar deviation. Tenderness over most MCP joints was noted. Mild synovitis was noted as described below. Hip joints knee joints ankles were good range of motion. She tenderness across MTP joints. CDAI Exam: CDAI Homunculus Exam:   Tenderness:  Right hand: 2nd MCP, 3rd MCP and 4th MCP Left hand: 2nd MCP and 2nd PIP  Swelling:  Right hand: 2nd MCP and 4th MCP  Joint Counts:  CDAI Tender Joint count: 5 CDAI Swollen Joint count: 2  Global Assessments:  Patient Global Assessment: 3 Provider Global Assessment: 3  CDAI Calculated Score: 13   Investigation: No additional findings. CBC Latest Ref Rng & Units 10/29/2016  WBC 3.8 - 10.8 K/uL 4.2  Hemoglobin 11.7 - 15.5 g/dL 13.7  Hematocrit 35.0 - 45.0 % 42.0  Platelets 140 - 400 K/uL 310   CMP     Component Value Date/Time   NA 135 10/29/2016 0922   K 3.7 10/29/2016 0922   CL 101 10/29/2016 0922   CO2 27 10/29/2016 0922   GLUCOSE 94 10/29/2016 0922   BUN 15 10/29/2016 0922   CREATININE 0.48 (L) 10/29/2016 0922   CALCIUM 9.3 10/29/2016 0922   PROT 7.6 10/29/2016 0922   ALBUMIN 4.2 10/29/2016 0922   AST 61 (H) 10/29/2016 0922   ALT 59 (H) 10/29/2016 0922   ALKPHOS 78 10/29/2016 0922   BILITOT 0.6 10/29/2016 0922   GFRNONAA >89 10/29/2016 0922   GFRAA >89 10/29/2016 0922   SPEP increasing gammaglobulin, TB gold negative, immunoglobulins elevated IgG and IgM, HIV negative ESR 42, RF> 1000, anti-CCP> 250 Imaging: [place] Extrem Up Bilat Comp  Result Date: 11/27/2016 Ultrasound examination of bilateral hands was performed per EULAR recommendations. Using 12 MHz  transducer, grayscale and power Doppler bilateral second, third, and fifth MCP joints and bilateral wrist joints both dorsal and volar aspects were evaluated to look for synovitis or tenosynovitis. The findings were there was severe narrowing of all MCP joints and intercarpal joints. Erosive changes noted in bilateral second and fifth MCP joints and carpal joints. Synovitis noted in right second and third MCP joints and bilateral wrist joints on ultrasound examination. Right median nerve was 0.09 cm squares which was within normal limits and left median nerve was 0.07 cm squares which was within normal limits. Impression: Ultrasound examination showed severe rheumatoid arthritis with joint space narrowing synovial thickening and mild synovitis as described above. Bilateral median nerves are within normal limits.   Speciality Comments: No specialty comments available.    Procedures:  Medium Joint Inj Date/Time: 11/28/2016 2:29 PM Performed by: Bo Merino Authorized by: Bo Merino   Consent Given by:  Patient Site marked: the procedure site was marked   Timeout:  prior to procedure the correct patient, procedure, and site was verified   Indications:  Pain and joint swelling Location:  Elbow Prep: patient was prepped and draped in usual sterile fashion   Needle Size:  27 G Needle Length:  1.5 inches Approach:  Posterior Ultrasound Guided: No   Fluoroscopic Guidance: No   Medications:  20 mg triamcinolone acetonide 40 MG/ML (Kenalog 10 mg, 40 mg per mL injected) Aspiration Attempted: Yes   Aspirate amount (mL):  0    Allergies: Patient has no known allergies.   Assessment / Plan:     Visit Diagnoses: Rheumatoid arthritis involving multiple sites with positive rheumatoid factor (HCC) - Positive RF, positive anti-CCP, elevated ESR, erosive disease with nodulosis. She continues to have pain and discomfort and increasing stiffness in her hands due to rheumatoid arthritis. She  also has increased discomfort in her feet. She had ultrasound yesterday which showed active synovitis. We discussed switching her to Humira. She was in agreement. We'll obtain informed consent prior to injection. Indications side effects contraindications were discussed at length. She took her Enbrel injection this week. We will start her on Humira in a week. She will discontinue Enbrel and will continue methotrexate for right now.  Rheumatoid nodule, left elbow ([place]): According to patient's request the nodule on her left forearm next to her elbow was injected with an milligrams of Kenalog which she tolerated well.  High risk medication use - Enbrel 50 mg subcutaneous every week, methotrexate 20 mg by mouth every week, folic acid 2 mg by mouth daily. Her most recent labs showed elevated LFTs. She has discontinued anti-inflammatories. We will continue to monitor it.  Dry mouth over the counter parts were discussed.  Hypertension, essential : Blood pressure is well controlled.  Orders: Orders Placed This Encounter  Procedures  . Medium Joint Injection/Arthrocentesis  . Medium Joint Injection/Arthrocentesis  . Small Joint Injection/Arthrocentesis   No orders of the defined types were placed in this encounter.   Face-to-face time spent with patient was 30 minutes. Greater than 50% of time was spent in counseling and coordination of care.  Follow-Up Instructions: Return in about 3 months (around 02/28/2017) for Rheumatoid arthritis.   Bo Merino, MD  Note - This record has been created using Editor, commissioning.  Chart creation errors have been sought, but may not always  have been located. Such creation errors do not reflect on  the standard of medical care.

## 2016-11-27 ENCOUNTER — Inpatient Hospital Stay (INDEPENDENT_AMBULATORY_CARE_PROVIDER_SITE_OTHER): Payer: Self-pay

## 2016-11-27 ENCOUNTER — Ambulatory Visit (INDEPENDENT_AMBULATORY_CARE_PROVIDER_SITE_OTHER): Payer: 59 | Admitting: Rheumatology

## 2016-11-27 ENCOUNTER — Telehealth: Payer: Self-pay

## 2016-11-27 DIAGNOSIS — M79642 Pain in left hand: Secondary | ICD-10-CM

## 2016-11-27 DIAGNOSIS — M79641 Pain in right hand: Secondary | ICD-10-CM

## 2016-11-27 NOTE — Telephone Encounter (Signed)
A prior authorization for HUMIRA has been submitted to patients insurance via cover my meds. Will update once we receive a response.   Vickie [name], [place], CPhT 4:34 PM

## 2016-11-28 ENCOUNTER — Encounter: Payer: Self-pay | Admitting: Rheumatology

## 2016-11-28 ENCOUNTER — Ambulatory Visit (INDEPENDENT_AMBULATORY_CARE_PROVIDER_SITE_OTHER): Payer: 59 | Admitting: Rheumatology

## 2016-11-28 VITALS — BP 102/60 | HR 68 | Resp 16 | Ht 61.0 in | Wt 120.0 lb

## 2016-11-28 DIAGNOSIS — M0579 Rheumatoid arthritis with rheumatoid factor of multiple sites without organ or systems involvement: Secondary | ICD-10-CM | POA: Diagnosis not present

## 2016-11-28 DIAGNOSIS — I10 Essential (primary) hypertension: Secondary | ICD-10-CM

## 2016-11-28 DIAGNOSIS — Z79899 Other long term (current) drug therapy: Secondary | ICD-10-CM

## 2016-11-28 DIAGNOSIS — M06322 Rheumatoid nodule, left elbow: Secondary | ICD-10-CM | POA: Diagnosis not present

## 2016-11-28 DIAGNOSIS — R682 Dry mouth, unspecified: Secondary | ICD-10-CM

## 2016-11-28 MED ORDER — TRIAMCINOLONE ACETONIDE 40 MG/ML IJ SUSP
20.0000 mg | INTRAMUSCULAR | Status: AC | PRN
  Administered 2016-11-28: 20 mg via INTRA_ARTICULAR

## 2016-11-28 MED ORDER — ADALIMUMAB 40 MG/0.8ML ~~LOC~~ AJKT: 40.0000 mg | AUTO-INJECTOR | SUBCUTANEOUS | 0 refills | Status: DC

## 2016-11-28 NOTE — Addendum Note (Signed)
Addended by: Henriette Combs on: 11/28/2016 12:44 PM   Modules accepted: Orders

## 2016-11-28 NOTE — Telephone Encounter (Addendum)
Called [employer] to check the status of patient's authorization for HUMIRA 40mg /0.79ml. Spoke with [facility] who states that the authorization has been approval from 11/28/16 through 11/27/2017. We should receive an approval letter via fax.   Reference number: 01/27/2018 Phone [phone]  Will send document to scan center once received.  Patient needs to be informed and an Rx must be sent to a specialty pharmacy. Patient has an appointment today. Per Dr. [phone], we would like to start her with a sample today.   Treven Holtman, [place], CPhT 11:14 AM

## 2016-11-28 NOTE — Patient Instructions (Signed)
Standing Labs We placed an order today for your standing lab work.    Please come back and get your standing labs in November and every 3 months  We have open lab Monday through Friday from 8:30-11:30 AM and 1:30-4 PM at the office of Dr. Kristene Liberati.   The office is located at [address], [address], [place], [place] [address] No appointment is necessary.   Labs are drawn by [facility].  You may receive a bill from [facility] for your lab work. If you have any questions regarding directions or hours of operation,  please call [phone].    

## 2016-11-28 NOTE — Telephone Encounter (Addendum)
Patient had her last Enbrel injection on 11/24/16. Patient will be unable to get her Humira today. Patient has been schedule for 12/03/16 for her new start to Humira. Will start with a  Sample and send prescription to pharmacy to be delivered to patient.

## 2016-12-03 ENCOUNTER — Ambulatory Visit (INDEPENDENT_AMBULATORY_CARE_PROVIDER_SITE_OTHER): Payer: 59 | Admitting: Rheumatology

## 2016-12-03 VITALS — BP 109/76 | HR 86

## 2016-12-03 DIAGNOSIS — M0579 Rheumatoid arthritis with rheumatoid factor of multiple sites without organ or systems involvement: Secondary | ICD-10-CM

## 2016-12-03 MED ORDER — ADALIMUMAB 40 MG/0.4ML ~~LOC~~ AJKT
40.0000 mg | AUTO-INJECTOR | Freq: Once | SUBCUTANEOUS | Status: AC
  Administered 2016-12-03: 40 mg via SUBCUTANEOUS

## 2016-12-03 NOTE — Patient Instructions (Addendum)
Standing Labs We placed an order today for your standing lab work.    Please come back and get your standing labs in November and every 3 months  We have open lab Monday through Friday from 8:30-11:30 AM and 1:30-4 PM at the office of Dr. Pollyann Savoy.   The office is located at [address], [place], [place] [address] No appointment is necessary.   Labs are drawn by [facility].  You may receive a bill from [facility] for your lab work. If you have any questions regarding directions or hours of operation,  please call [phone].     Adalimumab Injection What is this medicine? ADALIMUMAB (a dal AYE mu mab) is used to treat rheumatoid and psoriatic arthritis. It is also used to treat ankylosing spondylitis, Crohn's disease, ulcerative colitis, plaque psoriasis, hidradenitis suppurativa, and uveitis. This medicine may be used for other purposes; ask your health care provider or pharmacist if you have questions. COMMON BRAND NAME(S): CYLTEZO, Humira What should I tell my health care provider before I take this medicine? They need to know if you have any of these conditions: -diabetes -heart disease -hepatitis B or history of hepatitis B infection -immune system problems -infection or history of infections -multiple sclerosis -recently received or scheduled to receive a vaccine -scheduled to have surgery -tuberculosis, a positive skin test for tuberculosis or have recently been in close contact with someone who has tuberculosis -an unusual reaction to adalimumab, other medicines, mannitol, latex, rubber, foods, dyes, or preservatives -pregnant or trying to get pregnant -breast-feeding How should I use this medicine? This medicine is for injection under the skin. You will be taught how to prepare and give this medicine. Use exactly as directed. Take your medicine at regular intervals. Do not take your medicine more often than directed. A special MedGuide will be given to you  by the pharmacist with each prescription and refill. Be sure to read this information carefully each time. It is important that you put your used needles and syringes in a special sharps container. Do not put them in a trash can. If you do not have a sharps container, call your pharmacist or healthcare provider to get one. Talk to your pediatrician regarding the use of this medicine in children. While this drug may be prescribed for children as young as 2 years for selected conditions, precautions do apply. The manufacturer of the medicine offers free information to patients and their health care partners. Call [phone] for more information. Overdosage: If you think you have taken too much of this medicine contact a poison control center or emergency room at once. NOTE: This medicine is only for you. Do not share this medicine with others. What if I miss a dose? If you miss a dose, take it as soon as you can. If it is almost time for your next dose, take only that dose. Do not take double or extra doses. Give the next dose when your next scheduled dose is due. Call your doctor or health care professional if you are not sure how to handle a missed dose. What may interact with this medicine? Do not take this medicine with any of the following medications: -abatacept -anakinra -etanercept -infliximab -live virus vaccines -rilonacept This medicine may also interact with the following medications: -vaccines This list may not describe all possible interactions. Give your health care provider a list of all the medicines, herbs, non-prescription drugs, or dietary supplements you use. Also tell them if you smoke, drink  alcohol, or use illegal drugs. Some items may interact with your medicine. What should I watch for while using this medicine? Visit your doctor or health care professional for regular checks on your progress. Tell your doctor or healthcare professional if your symptoms do not start to  get better or if they get worse. You will be tested for tuberculosis (TB) before you start this medicine. If your doctor prescribes any medicine for TB, you should start taking the TB medicine before starting this medicine. Make sure to finish the full course of TB medicine. Call your doctor or health care professional if you get a cold or other infection while receiving this medicine. Do not treat yourself. This medicine may decrease your body's ability to fight infection. Talk to your doctor about your risk of cancer. You may be more at risk for certain types of cancers if you take this medicine. What side effects may I notice from receiving this medicine? Side effects that you should report to your doctor or health care professional as soon as possible: -allergic reactions like skin rash, itching or hives, swelling of the face, lips, or tongue -breathing problems -changes in vision -chest pain -fever, chills, or any other sign of infection -numbness or tingling -red, scaly patches or raised bumps on the skin -swelling of the ankles -swollen lymph nodes in the neck, underarm, or groin areas -unexplained weight loss -unusual bleeding or bruising -unusually weak or tired Side effects that usually do not require medical attention (report to your doctor or health care professional if they continue or are bothersome): -headache -nausea -redness, itching, swelling, or bruising at site where injected This list may not describe all possible side effects. Call your doctor for medical advice about side effects. You may report side effects to FDA at 1-800-FDA-1088. Where should I keep my medicine? Keep out of the reach of children. Store in the original container and in the refrigerator between 2 and 8 degrees C (36 and 46 degrees F). Do not freeze. The product may be stored in a cool carrier with an ice pack, if needed. Protect from light. Throw away any unused medicine after the expiration  date. NOTE: This sheet is a summary. It may not cover all possible information. If you have questions about this medicine, talk to your doctor, pharmacist, or health care provider.  2018 Elsevier/Gold Standard (2014-08-31 11:11:43)   Prescription for Humira was sent to  [facility],  - [address] [phone] (Phone)

## 2016-12-03 NOTE — Progress Notes (Signed)
Patient in office to for initial Humira injection. Patient is switching from Enbrel to Humira. Her last Enbrel injection was November 24, 2016. Patient was provided with a sample. Patient given instructions and demonstration of proper technique for administration. Patient was able to demonstrate proper technique. Patient was given injection in her left thigh and tolerated well. Patient was monitored in the office for 30 minutes after administration for adverse reactions.   Administrations This Visit    Adalimumab PNKT 40 mg    Admin Date 12/03/2016 Action Given Dose 40 mg Route Subcutaneous Administered By Henriette Combs, LPN          Counseled patient that Humira is a TNF blocking agent.  Counseled patient on purpose, proper use, and adverse effects of Humira.  Reviewed the most common adverse effects including infections, headache, and injection site reactions. Discussed that there is the possibility of an increased risk of malignancy but it is not well understood if this increased risk is due to the medication or the disease state.  Advised patient to get yearly dermatology exams due to risk of skin cancer.  Reviewed the importance of regular labs while on Humira therapy.  Counseled patient that Humira should be held prior to scheduled surgery.  Counseled patient to avoid live vaccines while on Humira.  Advised patient to get annual influenza vaccine and the pneumococcal vaccine as indicated.  Provided patient with medication education material and answered all questions.  Patient consented to Humira.  Will upload consent into the media tab.  Reviewed storage instructions of Humira.  Advised initial injection must be administered in office.

## 2016-12-05 NOTE — Progress Notes (Signed)
HPI:   [name] is a 51 y.o. female, who is here today for her routine physical.  Last CPE: 05/2015 in [place]. A month ago she had biometric done through work and otherwsie normal except mildly elevated TG.  Regular exercise:: She tries to exercise 2 times per weeks Following a healthy diet: Yes. She lives with her husband,son,and mother  Chronic medical problems: RA, she follows with rheuma. Some of her medications were changed, now she is on Humira and Mobic was discontinued.   HTN: On Amlodipine 5 mg daily.  Denies severe/frequent headache, visual changes, chest pain, dyspnea, claudication, focal weakness, or edema.  Pap smear 05/2015 Hx of abnormal pap smears: Once a long, repeated it and was normal. Hx of STD's Denies.  Immunization History  Administered Date(s) Administered  . Influenza,inj,Quad PF,6+ Mos 12/05/2015  . Tdap 09/26/2014  Pneumovax about 3 years ago.   LMP at least 5 years ago. Stopped OCP's 06/2016.  Mammogram: 05/2015. She has breast Bx last year, not sure which breast. She is reporting it as negative.  Colonoscopy: Never. DEXA: Planned through her rheuma.  She has no concerns today.  During examination today irregular heart rate was noted. As I remember, I noted one extrasystole and we dicussed it, which she also remembers. She has not had an palpitation, dizziness,or chest discomfort. Today I heard several ina minute.   Review of Systems  Constitutional: Negative for appetite change, fatigue, fever and unexpected weight change.  HENT: Negative for dental problem, hearing loss, mouth sores, nosebleeds, sore throat, trouble swallowing and voice change.   Eyes: Negative for redness and visual disturbance.  Respiratory: Negative for cough, shortness of breath and wheezing.   Cardiovascular: Negative for chest pain and leg swelling.  Gastrointestinal: Negative for abdominal pain, blood in stool, nausea and vomiting.       No changes in  bowel habits.  Endocrine: Negative for cold intolerance and heat intolerance.  Genitourinary: Negative for decreased urine volume, dysuria, hematuria, pelvic pain, vaginal bleeding and vaginal discharge.       No breast tenderness or nipple discharge.  Musculoskeletal: Positive for arthralgias. Negative for gait problem and myalgias.  Skin: Negative for pallor and rash.  Neurological: Negative for syncope, weakness, numbness and headaches.  Hematological: Negative for adenopathy. Does not bruise/bleed easily.  Psychiatric/Behavioral: Negative for confusion and sleep disturbance. The patient is not nervous/anxious.   All other systems reviewed and are negative.   Current Outpatient Prescriptions on File Prior to Visit  Medication Sig Dispense Refill  . Adalimumab (HUMIRA PEN) 40 MG/0.8ML PNKT Inject 40 mg into the skin every 14 (fourteen) days. 3 each 0  . amLODipine (NORVASC) 5 MG tablet Take 1 tablet (5 mg total) by mouth daily. 90 tablet 1  . folic acid (FOLVITE) 1 MG tablet Take 2 mg by mouth daily.    . methotrexate (RHEUMATREX) 2.5 MG tablet Take 20 mg by mouth once a week.      No current facility-administered medications on file prior to visit.      Past Medical History:  Diagnosis Date  . Arthritis    RA Dx at age 31  . Bell's palsy   . Chicken pox   . Hypertension     Past Surgical History:  Procedure Laterality Date  . CESAREAN SECTION    . FOOT SURGERY      No Known Allergies  Family History  Problem Relation Age of Onset  . Arthritis Mother   .  Diabetes Neg Hx   . Hypertension Neg Hx   . Hyperlipidemia Neg Hx   . Stroke Neg Hx     Social History   Social History  . Marital status: Married    Spouse name: N/A  . Number of children: N/A  . Years of education: N/A   Social History Main Topics  . Smoking status: Never Smoker  . Smokeless tobacco: Never Used  . Alcohol use Yes  . Drug use: No  . Sexual activity: Not Asked   Other Topics Concern    . None   Social History Narrative  . None     Vitals:   12/06/16 0924  BP: 118/80  Pulse: 80  Resp: 12  SpO2: 98%   Body mass index is 22.39 kg/m.   Wt Readings from Last 3 Encounters:  12/06/16 118 lb 8 oz (53.8 kg)  11/28/16 120 lb (54.4 kg)  10/29/16 120 lb (54.4 kg)    Physical Exam  Nursing note and vitals reviewed. Constitutional: She is oriented to person, place, and time. She appears well-developed and well-nourished. No distress.  HENT:  Head: Normocephalic and atraumatic.  Right Ear: Hearing and external ear normal.  Left Ear: Hearing and external ear normal.  Mouth/Throat: Uvula is midline, oropharynx is clear and moist and mucous membranes are normal.  Eyes: Pupils are equal, round, and reactive to light. Conjunctivae and EOM are normal.  Neck: No tracheal deviation present. No thyromegaly present.  Cardiovascular: Normal rate.  An irregular rhythm present.  No murmur heard. Pulses:      Dorsalis pedis pulses are 2+ on the right side, and 2+ on the left side.  Respiratory: Effort normal and breath sounds normal. No respiratory distress.  GI: Soft. She exhibits no mass. There is no hepatomegaly. There is no tenderness.  Genitourinary: There is no rash, tenderness or lesion on the right labia. There is no rash, tenderness or lesion on the left labia. Uterus is not enlarged and not tender. Cervix exhibits no motion tenderness, no discharge and no friability. Right adnexum displays no mass, no tenderness and no fullness. Left adnexum displays no mass, no tenderness and no fullness. No erythema, tenderness or bleeding in the vagina. No vaginal discharge found.    Genitourinary Comments: Breast: Nodularity on upper outer quadrants bilateral L>R. No skin changes or nipple discharge.  Cervix: Nabothian cyst at 9 O'clock.   Musculoskeletal: She exhibits no edema or tenderness.  No major deformity or signs of synovitis appreciated.  Lymphadenopathy:    She has no  cervical adenopathy.    She has no axillary adenopathy.       Right: No inguinal and no supraclavicular adenopathy present.       Left: No inguinal and no supraclavicular adenopathy present.  Neurological: She is alert and oriented to person, place, and time. She has normal strength. No cranial nerve deficit. Coordination and gait normal.  Reflex Scores:      Patellar reflexes are 2+ on the right side and 2+ on the left side. Skin: Skin is warm. No rash noted. No erythema.  Psychiatric: She has a normal mood and affect. Cognition and memory are normal.  Well groomed, good eye contact.     ASSESSMENT AND PLAN:   Rajah was seen today for gynecologic exam.  Diagnoses and all orders for this visit:  Encounter for gynecological examination without abnormal finding  We discussed the importance of regular physical activity and healthy diet for prevention of chronic illness  and/or complications. Preventive guidelines reviewed. Lipid and diabetes screening done in 10/2016, she will bring copy. Vaccination up to date. Aspirin for primary prevention not recommended today, deferred decision to cardiologist. Ca++ and vit D supplementation recommended. Next CPE in a year.  Irregular heart rate  EKG today SR, normal axis and intervals, PVC's. No other EKG available for comparison. We discussed possible etiologies, no CVD risk factors except for RA (chronic inflammation), which could increase risk. She is currently asymptomatic. Cardiology referral was placed. She was instructed about warning signs.  -     EKG 12-Lead -     Ambulatory referral to Cardiology  Screening for malignant neoplasm of cervix -     Cytology - PAP ([place])  Breast screening -     MM DIGITAL SCREENING BILATERAL; Future  Special screening for malignant neoplasms, colon -     Ambulatory referral to Gastroenterology  Hypertension, essential  Adequately controlled. No changes in current management. DASH  diet recommended. Eye exam recommended annually. F/U in 12 months since she is also following with other providers periodically, before if needed.  Rheumatoid arthritis involving multiple sites with positive rheumatoid factor (HCC)  Stable overall. She will continue following with her rheumatologist. Rx for Shingrix given today.   Other orders -     Zoster Vac Recomb Adjuvanted [facility]) injection; Inject 0.5 mLs into the muscle every 3 (three) months.     Return in 1 year (on 12/06/2017) for CPE with pap.     Betty G. [place], MD  [facility]. [place] office.

## 2016-12-06 ENCOUNTER — Other Ambulatory Visit (HOSPITAL_COMMUNITY)
Admission: RE | Admit: 2016-12-06 | Discharge: 2016-12-06 | Disposition: A | Discharge status: Home / Self Care | Payer: 59 | Source: Ambulatory Visit | Attending: Family Medicine | Admitting: Family Medicine

## 2016-12-06 ENCOUNTER — Ambulatory Visit (INDEPENDENT_AMBULATORY_CARE_PROVIDER_SITE_OTHER): Payer: 59 | Admitting: Family Medicine

## 2016-12-06 ENCOUNTER — Encounter: Payer: Self-pay | Admitting: Family Medicine

## 2016-12-06 ENCOUNTER — Ambulatory Visit: Payer: 59 | Admitting: Family Medicine

## 2016-12-06 VITALS — BP 118/80 | HR 80 | Resp 12 | Ht 61.0 in | Wt 118.5 lb

## 2016-12-06 DIAGNOSIS — Z1211 Encounter for screening for malignant neoplasm of colon: Secondary | ICD-10-CM

## 2016-12-06 DIAGNOSIS — Z124 Encounter for screening for malignant neoplasm of cervix: Secondary | ICD-10-CM

## 2016-12-06 DIAGNOSIS — M0579 Rheumatoid arthritis with rheumatoid factor of multiple sites without organ or systems involvement: Secondary | ICD-10-CM

## 2016-12-06 DIAGNOSIS — Z01419 Encounter for gynecological examination (general) (routine) without abnormal findings: Secondary | ICD-10-CM

## 2016-12-06 DIAGNOSIS — I499 Cardiac arrhythmia, unspecified: Secondary | ICD-10-CM | POA: Diagnosis not present

## 2016-12-06 DIAGNOSIS — I10 Essential (primary) hypertension: Secondary | ICD-10-CM | POA: Diagnosis not present

## 2016-12-06 DIAGNOSIS — Z1231 Encounter for screening mammogram for malignant neoplasm of breast: Secondary | ICD-10-CM

## 2016-12-06 DIAGNOSIS — Z1239 Encounter for other screening for malignant neoplasm of breast: Secondary | ICD-10-CM

## 2016-12-06 MED ORDER — ZOSTER VAC RECOMB ADJUVANTED 50 MCG/0.5ML IM SUSR: 0.5000 mL | INTRAMUSCULAR | 1 refills | Status: AC

## 2016-12-06 NOTE — Patient Instructions (Addendum)
A few things to remember from today's visit:   Encounter for gynecological examination without abnormal finding  Irregular heart rate - Plan: EKG 12-Lead, Ambulatory referral to Cardiology  Screening for malignant neoplasm of cervix - Plan: Cytology - PAP ([place])  Breast screening - Plan: MM DIGITAL SCREENING BILATERAL  Special screening for malignant neoplasms, colon - Plan: Ambulatory referral to Gastroenterology  Today you have you routine preventive visit.  At least 150 minutes of moderate exercise per week, daily brisk walking for 15-30 min is a good exercise option. Healthy diet low in saturated (animal) fats and sweets and consisting of fresh fruits and vegetables, lean meats such as fish and white chicken and whole grains.  These are some of recommendations for screening depending of age and risk factors:   - Vaccines:  Tdap vaccine every 10 years.  Shingles vaccine recommended at age 76, could be given after 52 years of age but not sure about insurance coverage.   Pneumonia vaccines:  Prevnar 13 at 65 and Pneumovax at 66. Sometimes Pneumovax is giving earlier if history of smoking, lung disease,diabetes,kidney disease among some. Because RA I recommend Pneumovax every 5 years,so due in 2 years.    Screening for diabetes at age 4 and every 3 years.  Cervical cancer prevention:  Pap smear starts at 52 years of age and continues periodically until 52 years old in low risk women. Pap smear every 3 years between 63 and 72 years old. Pap smear every 3-5 years between women 30 and older if pap smear negative and HPV screening negative. In your case we will repeat pap smear annually.   -Breast cancer: Mammogram: There is disagreement between experts about when to start screening in low risk asymptomatic female but recent recommendations are to start screening at 30 and not later than 52 years old , every 1-2 years and after 52 yo q 2 years. Screening is recommended until  52 years old but some women can continue screening depending of healthy issues.   Colon cancer screening: starts at 52 years old until 52 years old.  Cholesterol disorder screening at age 56 and every 3 years.  Also recommended:  1. Dental visit- Brush and floss your teeth twice daily; visit your dentist twice a year. 2. Eye doctor- Get an eye exam at least every 2 years. 3. Helmet use- Always wear a helmet when riding a bicycle, motorcycle, rollerblading or skateboarding. 4. Safe sex- If you may be exposed to sexually transmitted infections, use a condom. 5. Seat belts- Seat belts can save your live; always wear one. 6. Smoke/Carbon Monoxide detectors- These detectors need to be installed on the appropriate level of your home. Replace batteries at least once a year. 7. Skin cancer- When out in the sun please cover up and use sunscreen 15 SPF or higher. 8. Violence- If anyone is threatening or hurting you, please tell your healthcare provider.  9. Drink alcohol in moderation- Limit alcohol intake to one drink or less per day. Never drink and drive.   Because you are seeing rheuma periodically I think we can also follow on hypertension annually unless blood pressure numbers start going up or another problem. Today EKG showed extra beats, cardiology referral placed.    Please be sure medication list is accurate. If a new problem present, please set up appointment sooner than planned today.

## 2016-12-09 ENCOUNTER — Encounter: Payer: Self-pay | Admitting: Internal Medicine

## 2016-12-10 ENCOUNTER — Encounter: Payer: Self-pay | Admitting: Family Medicine

## 2016-12-10 LAB — CYTOLOGY - PAP
DIAGNOSIS: NEGATIVE
Diagnosis: REACTIVE

## 2016-12-30 ENCOUNTER — Ambulatory Visit
Admission: RE | Admit: 2016-12-30 | Discharge: 2016-12-30 | Disposition: A | Discharge status: Home / Self Care | Payer: 59 | Source: Ambulatory Visit | Attending: Family Medicine | Admitting: Family Medicine

## 2016-12-30 DIAGNOSIS — Z1239 Encounter for other screening for malignant neoplasm of breast: Secondary | ICD-10-CM

## 2017-01-02 ENCOUNTER — Encounter: Payer: Self-pay | Admitting: Cardiovascular Disease

## 2017-01-02 ENCOUNTER — Ambulatory Visit (INDEPENDENT_AMBULATORY_CARE_PROVIDER_SITE_OTHER): Payer: 59 | Admitting: Cardiovascular Disease

## 2017-01-02 VITALS — BP 130/80 | HR 77 | Ht 61.0 in | Wt 118.6 lb

## 2017-01-02 DIAGNOSIS — R0683 Snoring: Secondary | ICD-10-CM

## 2017-01-02 DIAGNOSIS — I493 Ventricular premature depolarization: Secondary | ICD-10-CM

## 2017-01-02 DIAGNOSIS — R0681 Apnea, not elsewhere classified: Secondary | ICD-10-CM | POA: Diagnosis not present

## 2017-01-02 HISTORY — DX: Snoring: R06.83

## 2017-01-02 HISTORY — DX: Ventricular premature depolarization: I49.3

## 2017-01-02 NOTE — Progress Notes (Signed)
Cardiology Office Note   Date:  01/02/2017   ID:  Donia, Yokum 05-30-1965, MRN [id number]  PCP:  [place], Betty G, MD  Cardiologist:   Chilton Si, MD   No chief complaint on file.     History of Present Illness: Vickie [name] is a 51 y.o. female with hypertension and rheumatoid arthritis who is being seen today for the evaluation of PVCs at the request of [place], Timoteo Expose, MD.  Vickie [name] presented for an annual exam with Dr. [place] on 12/06/16.  At that appointment she was noted to have PVCs on her EKG.  Vickie [name] reports that she has been feeling well physically other than her rheumatoid arthritis.  She was previously on Enbrel and has switched to Humira.  She feels that this is starting to work better.  She denies any chest pain or pressure.  She does not get much formal exercise but likes to go for a walk approximately 1 hour a couple times per week.  She has no chest pain or shortness of breath with this activity.  She actually feels the best when exercising.  She has noted a few episodes of feeling "spacey".  She does not feel lightheaded or presyncopal.  It tends to happen worse in the mornings and last for a couple hours at a time.  There are no associated palpitations, chest pain, or shortness of breath.  She is unclear what precipitates these episodes.  She notes that she has been under a lot of stress since May.  Her mother was in the hospital for a prolonged period of time and was initially on hospice.  However, she has subsequently come off hospice and is now living here in [place].  She does not drink much caffeine and does not use any over-the-counter cold or cough medications regularly.   Vickie [name] husband notes that she snores loudly.  It seems as though she stops breathing at times.  She denies daytime somnolence and feels rested in the mornings.  She has not noted any lower extremity edema, orthopnea, or PND.  She did have mild edema approximately 1 month ago  that lasted for about a week but has since improved.   Past Medical History:  Diagnosis Date  . Arthritis    RA Dx at age 64  . Bell's palsy   . Chicken pox   . Hypertension   . PVC (premature ventricular contraction) 01/02/2017  . Snoring 01/02/2017    Past Surgical History:  Procedure Laterality Date  . CESAREAN SECTION    . FOOT SURGERY       Current Outpatient Medications  Medication Sig Dispense Refill  . Adalimumab (HUMIRA PEN) 40 MG/0.8ML PNKT Inject 40 mg into the skin every 14 (fourteen) days. 3 each 0  . amLODipine (NORVASC) 5 MG tablet Take 1 tablet (5 mg total) by mouth daily. 90 tablet 1  . folic acid (FOLVITE) 1 MG tablet Take 2 mg by mouth daily.    . methotrexate (RHEUMATREX) 2.5 MG tablet Take 20 mg by mouth once a week.     . Zoster Vac Recomb Adjuvanted (SHINGRIX) injection Inject 0.5 mLs into the muscle every 3 (three) months. 1 each 1   No current facility-administered medications for this visit.     Allergies:   Patient has no known allergies.    Social History:  The patient  reports that  has never smoked. she has never used smokeless tobacco. She reports that  she drinks alcohol. She reports that she does not use drugs.   Family History:  The patient's family history includes Rheum arthritis in her mother.    ROS:  Please see the history of present illness.   Otherwise, review of systems are positive for none.   All other systems are reviewed and negative.    PHYSICAL EXAM: VS:  BP 130/80   Pulse 77   Ht 5\' 1"  (1.549 m)   Wt 53.8 kg (118 lb 9.6 oz)   SpO2 97%   BMI 22.41 kg/m  , BMI Body mass index is 22.41 kg/m. GENERAL:  Well appearing.  No acute distress  HEENT:  Pupils equal round and reactive, fundi not visualized, oral mucosa unremarkable NECK:  No jugular venous distention, waveform within normal limits, carotid upstroke brisk and symmetric, no bruits, no thyromegaly LYMPHATICS:  No cervical adenopathy LUNGS:  Clear to auscultation  bilaterally HEART:  RRR.  PMI not displaced or sustained,S1 and S2 within normal limits, no S3, no S4, no clicks, no rubs, no murmurs ABD:  Flat, positive bowel sounds normal in frequency in pitch, no bruits, no rebound, no guarding, no midline pulsatile mass, no hepatomegaly, no splenomegaly EXT:  2 plus pulses throughout, no edema, no cyanosis no clubbing.  Ulnar deviation of the MCPs bilaterally.  Synovitis of MCPs.  Swan-neck deformities of bilateral hands.  SKIN:  No rashes no nodules NEURO:  Cranial nerves II through XII grossly intact, motor grossly intact throughout PSYCH:  Cognitively intact, oriented to person place and time   EKG:  EKG is ordered today. The ekg ordered 12/06/16 demonstrates sinus rhythm.  Rate 73 bpm.  3 PVCs. 01/02/17: Sinus rhythm.  Rate 77 bpm.   Recent Labs: 10/29/2016: ALT 59; BUN 15; Creat 0.48; Hemoglobin 13.7; Platelets 310; Potassium 3.7; Sodium 135   09/26/15: Total cholesterol 164, triglycerides 120, HDL 60, LDL 80 Sodium 140, potassium 3.9, BUN 15, creatinine 0.62 WBC 5.3, hemoglobin 14.3, hematocrit 42.4, platelets 348 TSH 1.72, free T4 1.27   Lipid Panel No results found for: CHOL, TRIG, HDL, CHOLHDL, VLDL, LDLCALC, LDLDIRECT    Wt Readings from Last 3 Encounters:  01/02/17 53.8 kg (118 lb 9.6 oz)  12/06/16 53.8 kg (118 lb 8 oz)  11/28/16 54.4 kg (120 lb)      ASSESSMENT AND PLAN:  # PVCs:  No PVCs noted on EKG today.  She denies palpitatotions.  We will check a CMP, magnesium, CBC, TSH, and free T4.  I doubt that she has more than 10% PVCs given that she has not on exam today.  However, we will get an echocardiogram to assess for any underlying structural abnormality.  She has no symptoms concerning for ischemia.    # Hypertension: BP well-controlled on amlodipine.    # Snoring: We will get a sleep study given her history of heavy snoring and apnea.     Current medicines are reviewed at length with the patient today.  The patient does  not have concerns regarding medicines.  The following changes have been made:  no change  Labs/ tests ordered today include:   Orders Placed This Encounter  Procedures  . Magnesium  . Comprehensive metabolic panel  . T4, free  . TSH  . CBC with Differential/Platelet  . EKG 12-Lead  . ECHOCARDIOGRAM COMPLETE  . Split night study     Disposition:   FU with Sanjit Mcmichael C. 01/28/17, MD, [facility] as needed.     This note was written with  the assistance of speech recognition software.  Please excuse any transcriptional errors.  Signed, Geanine Vandekamp C. Duke Salvia, MD, [facility]  01/02/2017 1:25 PM    [facility]

## 2017-01-02 NOTE — Patient Instructions (Signed)
Medication Instructions:  Your physician recommends that you continue on your current medications as directed. Please refer to the Current Medication list given to you today.  Labwork: CMET/TSH/FT4/MAGNESIUM/CBC TODAY   Testing/Procedures: Your physician has requested that you have an echocardiogram. Echocardiography is a painless test that uses sound waves to create images of your heart. It provides your doctor with information about the size and shape of your heart and how well your heart's chambers and valves are working. This procedure takes approximately one hour. There are no restrictions for this procedure. [facility] AT [address]  Your physician has recommended that you have a sleep study. This test records several body functions during sleep, including: brain activity, eye movement, oxygen and carbon dioxide blood levels, heart rate and rhythm, breathing rate and rhythm, the flow of air through your mouth and nose, snoring, body muscle movements, and chest and belly movement.  Follow-Up: AS NEEDED        If you need a refill on your cardiac medications before your next appointment, please call your pharmacy.

## 2017-01-03 LAB — TSH: TSH: 0.656 u[IU]/mL (ref 0.450–4.500)

## 2017-01-03 LAB — CBC WITH DIFFERENTIAL/PLATELET
BASOS ABS: 0 10*3/uL (ref 0.0–0.2)
BASOS: 0 %
EOS (ABSOLUTE): 0.1 10*3/uL (ref 0.0–0.4)
Eos: 1 %
HEMATOCRIT: 38.6 % (ref 34.0–46.6)
HEMOGLOBIN: 13 g/dL (ref 11.1–15.9)
IMMATURE GRANS (ABS): 0 10*3/uL (ref 0.0–0.1)
Immature Granulocytes: 0 %
LYMPHS ABS: 1.5 10*3/uL (ref 0.7–3.1)
LYMPHS: 28 %
MCH: 30.8 pg (ref 26.6–33.0)
MCHC: 33.7 g/dL (ref 31.5–35.7)
MCV: 92 fL (ref 79–97)
MONOS ABS: 0.4 10*3/uL (ref 0.1–0.9)
Monocytes: 8 %
NEUTROS ABS: 3.3 10*3/uL (ref 1.4–7.0)
Neutrophils: 63 %
Platelets: 329 10*3/uL (ref 150–379)
RBC: 4.22 x10E6/uL (ref 3.77–5.28)
RDW: 14.2 % (ref 12.3–15.4)
WBC: 5.3 10*3/uL (ref 3.4–10.8)

## 2017-01-03 LAB — COMPREHENSIVE METABOLIC PANEL
ALBUMIN: 4.3 g/dL (ref 3.5–5.5)
ALK PHOS: 79 IU/L (ref 39–117)
ALT: 36 IU/L — ABNORMAL HIGH (ref 0–32)
AST: 38 IU/L (ref 0–40)
Albumin/Globulin Ratio: 1.3 (ref 1.2–2.2)
BILIRUBIN TOTAL: 0.8 mg/dL (ref 0.0–1.2)
BUN / CREAT RATIO: 28 — AB (ref 9–23)
BUN: 16 mg/dL (ref 6–24)
CHLORIDE: 104 mmol/L (ref 96–106)
CO2: 27 mmol/L (ref 20–29)
CREATININE: 0.58 mg/dL (ref 0.57–1.00)
Calcium: 10 mg/dL (ref 8.7–10.2)
GFR calc Af Amer: 123 mL/min/{1.73_m2} (ref >59)
GFR calc non Af Amer: 106 mL/min/{1.73_m2} (ref >59)
GLOBULIN, TOTAL: 3.4 g/dL (ref 1.5–4.5)
Glucose: 108 mg/dL — ABNORMAL HIGH (ref 65–99)
POTASSIUM: 4.4 mmol/L (ref 3.5–5.2)
SODIUM: 143 mmol/L (ref 134–144)
Total Protein: 7.7 g/dL (ref 6.0–8.5)

## 2017-01-03 LAB — T4, FREE: FREE T4: 1.12 ng/dL (ref 0.82–1.77)

## 2017-01-03 LAB — MAGNESIUM: Magnesium: 2.1 mg/dL (ref 1.6–2.3)

## 2017-01-07 ENCOUNTER — Other Ambulatory Visit (HOSPITAL_COMMUNITY): Payer: 59

## 2017-01-09 ENCOUNTER — Other Ambulatory Visit: Payer: Self-pay

## 2017-01-09 ENCOUNTER — Ambulatory Visit (HOSPITAL_COMMUNITY): Payer: 59 | Attending: Cardiology

## 2017-01-09 DIAGNOSIS — M0579 Rheumatoid arthritis with rheumatoid factor of multiple sites without organ or systems involvement: Secondary | ICD-10-CM

## 2017-01-09 DIAGNOSIS — I493 Ventricular premature depolarization: Secondary | ICD-10-CM | POA: Diagnosis not present

## 2017-01-09 DIAGNOSIS — Z79899 Other long term (current) drug therapy: Secondary | ICD-10-CM

## 2017-01-09 DIAGNOSIS — R0683 Snoring: Secondary | ICD-10-CM | POA: Insufficient documentation

## 2017-01-09 DIAGNOSIS — I10 Essential (primary) hypertension: Secondary | ICD-10-CM | POA: Insufficient documentation

## 2017-01-09 DIAGNOSIS — M2550 Pain in unspecified joint: Secondary | ICD-10-CM

## 2017-01-09 LAB — CBC WITH DIFFERENTIAL/PLATELET
BASOS ABS: 22 {cells}/uL (ref 0–200)
BASOS PCT: 0.4 %
Eosinophils Absolute: 32 cells/uL (ref 15–500)
Eosinophils Relative: 0.6 %
HEMATOCRIT: 40.1 % (ref 35.0–45.0)
HEMOGLOBIN: 13.6 g/dL (ref 11.7–15.5)
LYMPHS ABS: 1912 {cells}/uL (ref 850–3900)
MCH: 30.4 pg (ref 27.0–33.0)
MCHC: 33.9 g/dL (ref 32.0–36.0)
MCV: 89.7 fL (ref 80.0–100.0)
MONOS PCT: 7.4 %
MPV: 10.4 fL (ref 7.5–12.5)
NEUTROS ABS: 3035 {cells}/uL (ref 1500–7800)
Neutrophils Relative %: 56.2 %
Platelets: 332 10*3/uL (ref 140–400)
RBC: 4.47 10*6/uL (ref 3.80–5.10)
RDW: 13.4 % (ref 11.0–15.0)
Total Lymphocyte: 35.4 %
WBC mixed population: 400 cells/uL (ref 200–950)
WBC: 5.4 10*3/uL (ref 3.8–10.8)

## 2017-01-09 LAB — COMPLETE METABOLIC PANEL WITH GFR
AG Ratio: 1.2 (calc) (ref 1.0–2.5)
ALT: 29 U/L (ref 6–29)
AST: 27 U/L (ref 10–35)
Albumin: 4.4 g/dL (ref 3.6–5.1)
Alkaline phosphatase (APISO): 81 U/L (ref 33–130)
BUN: 13 mg/dL (ref 7–25)
CO2: 29 mmol/L (ref 20–32)
Calcium: 9.8 mg/dL (ref 8.6–10.4)
Chloride: 101 mmol/L (ref 98–110)
Creat: 0.57 mg/dL (ref 0.50–1.05)
GFR, Est African American: 124 mL/min/{1.73_m2} (ref >=60)
GFR, Est Non African American: 107 mL/min/{1.73_m2} (ref >=60)
Globulin: 3.6 g/dL (calc) (ref 1.9–3.7)
Glucose, Bld: 91 mg/dL (ref 65–99)
Potassium: 4 mmol/L (ref 3.5–5.3)
Sodium: 139 mmol/L (ref 135–146)
Total Bilirubin: 0.9 mg/dL (ref 0.2–1.2)
Total Protein: 8 g/dL (ref 6.1–8.1)

## 2017-01-20 ENCOUNTER — Ambulatory Visit (AMBULATORY_SURGERY_CENTER): Payer: Self-pay

## 2017-01-20 VITALS — Ht 62.0 in | Wt 115.0 lb

## 2017-01-20 DIAGNOSIS — Z1211 Encounter for screening for malignant neoplasm of colon: Secondary | ICD-10-CM

## 2017-01-20 MED ORDER — SUPREP BOWEL PREP KIT 17.5-3.13-1.6 GM/177ML PO SOLN: 1.0000 | Freq: Once | ORAL | 0 refills | Status: AC

## 2017-01-20 NOTE — Progress Notes (Signed)
No allergies to eggs or soy No diet meds No home oxygen No past problems with anesthesia  Registered emmi 

## 2017-01-23 ENCOUNTER — Encounter: Payer: Self-pay | Admitting: Internal Medicine

## 2017-01-30 ENCOUNTER — Other Ambulatory Visit: Payer: Self-pay | Admitting: Rheumatology

## 2017-01-30 ENCOUNTER — Telehealth: Payer: Self-pay | Admitting: Rheumatology

## 2017-01-30 MED ORDER — ADALIMUMAB 40 MG/0.8ML ~~LOC~~ AJKT: 40.0000 mg | AUTO-INJECTOR | SUBCUTANEOUS | 0 refills | Status: DC

## 2017-01-30 NOTE — Telephone Encounter (Signed)
Last Visit: 11/28/16 Next Visit: 04/16/17 Labs: 01/09/17 WNL TB Gold: 10/29/16 Neg

## 2017-01-30 NOTE — Telephone Encounter (Signed)
Patient needs refill on Humira. Patient uses [facility].

## 2017-02-03 ENCOUNTER — Encounter: Payer: 59 | Admitting: Internal Medicine

## 2017-02-06 ENCOUNTER — Telehealth: Payer: Self-pay | Admitting: Internal Medicine

## 2017-02-06 NOTE — Telephone Encounter (Signed)
Erie Noe will call pt and make another PV- last PV 11*-26-18- is over 60 days until her 04-04-17 colon- needs a PV   Hilda Lias PV

## 2017-02-11 ENCOUNTER — Other Ambulatory Visit: Payer: Self-pay

## 2017-02-11 NOTE — Telephone Encounter (Signed)
Patient states the pharmacy only sent her a 30 day supply of medications where she should have gotten a 90 day supply. Patient advised we sent in for a 90 day supply and she would have to discuss with the pharmacy as to why they only sent the 30 day supply. Patient states she was also sent Humira 40 mg/0.8 mL pens instead of Humira 40 mg/0.4 mL pens. Patient advised she dosage of 40 mg is still the same in both pens. Advised the Humira 40 mg/ 0.4 mL is Citrate Free where as the other is not. Patient advised we will make sure the Citrate Free is sent in with next refill. Patient advised to have the pharmacy contact the office with any concerns.

## 2017-02-11 NOTE — Telephone Encounter (Signed)
Patient called concerning Rx for Humira.  Patient has some questions about some changes with the Rx.  ZO#[phone].  Please advise.  Thank you.

## 2017-02-12 ENCOUNTER — Telehealth: Payer: Self-pay | Admitting: *Deleted

## 2017-02-12 MED ORDER — ADALIMUMAB 40 MG/0.4ML ~~LOC~~ AJKT: 40.0000 mg | AUTO-INJECTOR | SUBCUTANEOUS | 0 refills | Status: DC

## 2017-02-12 NOTE — Telephone Encounter (Signed)
Spoke with [employer] and [place] and they were calling to verify the prescription and to find out if the patient was to be taking Humira 40 mg/0.4 mL or if she should be taking Humira 40 mg/0.8 mL. Verified  the patient is to be on Humira 40 mg/0.4 mL. Prescription resent to the pharmacy.

## 2017-02-13 ENCOUNTER — Telehealth: Payer: Self-pay | Admitting: *Deleted

## 2017-02-13 DIAGNOSIS — R0681 Apnea, not elsewhere classified: Secondary | ICD-10-CM

## 2017-02-13 DIAGNOSIS — R0683 Snoring: Secondary | ICD-10-CM

## 2017-02-13 DIAGNOSIS — I493 Ventricular premature depolarization: Secondary | ICD-10-CM

## 2017-02-13 NOTE — Telephone Encounter (Signed)
-----   Message from Silver Huguenin sent at 02/13/2017  3:28 PM EST ----- Regarding: FW: Is it okay to switch pt to home sleep study? Can you please put in order/referral for home study for this patient?   Thanks!  ----- Message ----- From: Haywood Filler Sent: 02/11/2017   3:58 PM To: [facility] Admin Pool Subject: FW: Is it okay to switch pt to home sleep st#  Per Dr. Duke Salvia, ok to switch to a home study. Thanks, Amy ----- Message ----- From: Chilton Si, MD Sent: 02/11/2017   3:50 PM To: Amy C Higgins Subject: RE: Is it okay to switch pt to home sleep st#  Sure that is fine.  Thanks, Tiffany C. Duke Salvia, MD, [facility]  ----- Message ----- From: Haywood Filler Sent: 02/05/2017  12:44 PM To: Chilton Si, MD Subject: Is it okay to switch pt to home sleep study?   Patient's insurance questions need for in lab sleep study. Is it okay to switch patient to a home sleep study? Thanks, Amy

## 2017-02-13 NOTE — Telephone Encounter (Signed)
Order placed as requested.

## 2017-03-14 ENCOUNTER — Encounter (HOSPITAL_BASED_OUTPATIENT_CLINIC_OR_DEPARTMENT_OTHER): Payer: 59

## 2017-03-24 ENCOUNTER — Other Ambulatory Visit: Payer: Self-pay

## 2017-03-24 ENCOUNTER — Ambulatory Visit (AMBULATORY_SURGERY_CENTER): Payer: Self-pay | Admitting: *Deleted

## 2017-03-24 VITALS — Ht 62.0 in | Wt 121.0 lb

## 2017-03-24 DIAGNOSIS — Z1211 Encounter for screening for malignant neoplasm of colon: Secondary | ICD-10-CM

## 2017-03-24 NOTE — Progress Notes (Signed)
Patient denies any allergies to eggs or soy. Patient denies any problems with anesthesia/sedation. Patient denies any oxygen use at home. Patient denies taking any diet/weight loss medications or blood thinners. EMMI education assisgned to patient on colonoscopy, this was explained and instructions given to patient. Patient has Suprep at home.  

## 2017-03-25 ENCOUNTER — Encounter: Payer: Self-pay | Admitting: Internal Medicine

## 2017-03-26 ENCOUNTER — Ambulatory Visit: Payer: 59 | Admitting: Rheumatology

## 2017-03-26 ENCOUNTER — Ambulatory Visit (HOSPITAL_BASED_OUTPATIENT_CLINIC_OR_DEPARTMENT_OTHER): Payer: 59 | Attending: Cardiovascular Disease | Admitting: Cardiovascular Disease

## 2017-03-26 VITALS — Ht 62.0 in | Wt 115.0 lb

## 2017-03-26 DIAGNOSIS — R0683 Snoring: Secondary | ICD-10-CM

## 2017-03-26 DIAGNOSIS — I493 Ventricular premature depolarization: Secondary | ICD-10-CM

## 2017-03-26 DIAGNOSIS — G4733 Obstructive sleep apnea (adult) (pediatric): Secondary | ICD-10-CM | POA: Insufficient documentation

## 2017-03-26 DIAGNOSIS — G4730 Sleep apnea, unspecified: Secondary | ICD-10-CM | POA: Diagnosis present

## 2017-03-26 DIAGNOSIS — R0681 Apnea, not elsewhere classified: Secondary | ICD-10-CM

## 2017-04-02 NOTE — Progress Notes (Signed)
Office Visit Note  Patient: Vickie [name]             Date of Birth: Oct 20, 1965           MRN: [id number]             PCP: [place], Betty G, MD Referring: Martin[place], Betty G, MD Visit Date: 04/16/2017 Occupation: _0 @    Subjective:  Medication management.  History of Present Illness: Vickie [name] is a 52 y.o. female with history of seropositive rheumatoid arthritis.  She has been on Humira since October 2018.  She is clinically doing much better on combination therapy.  She has been noticing some injection site reaction after Humira injection.  She denies any joint swelling or joint pain.  Activities of Daily Living:  Patient reports morning stiffness for 2 minutes.   Patient Denies nocturnal pain.  Difficulty dressing/grooming: Denies Difficulty climbing stairs: Denies Difficulty getting out of chair: Denies Difficulty using hands for taps, buttons, cutlery, and/or writing: Denies   Review of Systems  Constitutional: Negative for fatigue, night sweats, weight gain, weight loss and weakness.  HENT: Negative for mouth sores, trouble swallowing, trouble swallowing, mouth dryness and nose dryness.   Eyes: Negative for pain, redness, visual disturbance and dryness.  Respiratory: Negative for cough, shortness of breath and difficulty breathing.   Cardiovascular: Positive for hypertension. Negative for chest pain, palpitations, irregular heartbeat and swelling in legs/feet.  Gastrointestinal: Negative for blood in stool, constipation and diarrhea.  Endocrine: Negative for increased urination.  Genitourinary: Negative for vaginal dryness.  Musculoskeletal: Positive for morning stiffness. Negative for arthralgias, joint pain, joint swelling, myalgias, muscle weakness, muscle tenderness and myalgias.  Skin: Positive for rash. Negative for color change, hair loss, skin tightness, ulcers and sensitivity to sunlight.  Allergic/Immunologic: Negative for susceptible to infections.    Neurological: Negative for dizziness, memory loss and night sweats.  Hematological: Negative for swollen glands.  Psychiatric/Behavioral: Negative for depressed mood and sleep disturbance. The patient is not nervous/anxious.     PMFS History:  Patient Active Problem List   Diagnosis Date Noted  . PVC (premature ventricular contraction) 01/02/2017  . Snoring 01/02/2017  . High risk medication use 10/24/2016  . Rheumatoid nodule, left elbow ([place]) 10/24/2016  . Rheumatoid nodule, right elbow ([place]) 10/24/2016  . Calcaneal spur of both feet 10/24/2016  . Dry mouth 10/24/2016  . Rheumatoid arthritis ([place]) 08/07/2016  . Hypertension, essential 08/07/2016    Past Medical History:  Diagnosis Date  . Arthritis    RA Dx at age 89  . Bell's palsy   . Chicken pox   . Hypertension   . PVC (premature ventricular contraction) 01/02/2017  . Snoring 01/02/2017    Family History  Problem Relation Age of Onset  . Rheum arthritis Mother   . Diabetes Neg Hx   . Hypertension Neg Hx   . Hyperlipidemia Neg Hx   . Stroke Neg Hx   . Colon cancer Neg Hx    Past Surgical History:  Procedure Laterality Date  . CESAREAN SECTION    . FOOT SURGERY     Social History   Social History Narrative  . Not on file     Objective: Vital Signs: BP 112/73 (BP Location: Left Arm, Patient Position: Sitting, Cuff Size: Normal)   Pulse 84   Resp 16   Ht _1  (1.575 m)   Wt 118 lb 8 oz (53.8 kg)   BMI 21.67 kg/m    Physical Exam  Constitutional: She is oriented to person, place, and time. She appears well-developed and well-nourished.  HENT:  Head: Normocephalic and atraumatic.  Eyes: Conjunctivae and EOM are normal.  Neck: Normal range of motion.  Cardiovascular: Normal rate, regular rhythm, normal heart sounds and intact distal pulses.  Pulmonary/Chest: Effort normal and breath sounds normal.  Abdominal: Soft. Bowel sounds are normal.  Lymphadenopathy:    She has no cervical adenopathy.   Neurological: She is alert and oriented to person, place, and time.  Skin: Skin is warm and dry. Capillary refill takes less than 2 seconds.  Rheumatoid nodules over bilateral elbows.  Psychiatric: She has a normal mood and affect. Her behavior is normal.  Nursing note and vitals reviewed.    Musculoskeletal Exam: C-spine thoracic lumbar spine good range of motion.  Shoulder joints elbow joints with good range of motion.  She has very limited range of motion of bilateral wrist joints.  She has synovial thickening over MCPs and PIP joints.  No synovitis was noted.  Hip joints knee joints with good range of motion.  No synovitis was noted.  CDAI Exam: CDAI Homunculus Exam:   Joint Counts:  CDAI Tender Joint count: 0 CDAI Swollen Joint count: 0  Global Assessments:  Patient Global Assessment: 4 Provider Global Assessment: 4  CDAI Calculated Score: 8    Investigation: No additional findings.TB Gold: 10/29/2016 Negative  CBC Latest Ref Rng & Units 01/09/2017 01/02/2017 10/29/2016  WBC 3.8 - 10.8 Thousand/uL 5.4 5.3 4.2  Hemoglobin 11.7 - 15.5 g/dL 13.6 13.0 13.7  Hematocrit 35.0 - 45.0 % 40.1 38.6 42.0  Platelets 140 - 400 Thousand/uL 332 329 310   CMP Latest Ref Rng & Units 01/09/2017 01/02/2017 10/29/2016  Glucose 65 - 99 mg/dL 91 108(H) 94  BUN 7 - 25 mg/dL _0 Creatinine 0.50 - 1.05 mg/dL 0.57 0.58 0.48(L)  Sodium 135 - 146 mmol/L 139 143 135  Potassium 3.5 - 5.3 mmol/L 4.0 4.4 3.7  Chloride 98 - 110 mmol/L 101 104 101  CO2 20 - 32 mmol/L _1 Calcium 8.6 - 10.4 mg/dL 9.8 10.0 9.3  Total Protein 6.1 - 8.1 g/dL 8.0 7.7 7.6  Total Bilirubin 0.2 - 1.2 mg/dL 0.9 0.8 0.6  Alkaline Phos 39 - 117 IU/L - 79 78  AST 10 - 35 U/L 27 38 61(H)  ALT 6 - 29 U/L 29 36(H) 59(H)    Imaging: No results found.  Speciality Comments: No specialty comments available.    Procedures:  No procedures performed Allergies: Patient has no known allergies.   Assessment / Plan:      Visit Diagnoses: Rheumatoid arthritis involving multiple sites with positive rheumatoid factor (HCC) - Positive RF, positive anti-CCP, elevated ESR, erosive disease with nodulosis.  She is clinically doing much better on Humira and methotrexate combination.  She has no synovitis on examination.  She has limited range of motion in multiple joints and synovial thickening.  High risk medication use - Humira 40 mg subcu every other week, MTX 8 tabs p.o. weekly, folic acid 2 mg p.o. daily - Plan: CBC with Differential/Platelet, COMPLETE METABOLIC PANEL WITH GFR today and then every 3 months  Injection site reaction, initial encounter: We had detailed discussion regarding injection site reaction.  Use of Zyrtec the day prior to injection, the day of injection in the day after was discussed.  Also use of topical hydrocortisone was discussed.  If her symptoms do not improve or get worse that she  is supposed to notify us.  Rheumatoid nodule, bilateral  elbows (HCC) stable  Calcaneal spur of both feet  History of hypertension pressure is under good control.  Dry mouth    Orders: Orders Placed This Encounter  Procedures  . CBC with Differential/Platelet  . COMPLETE METABOLIC PANEL WITH GFR   No orders of the defined types were placed in this encounter.   Face-to-face time spent with patient was 30 minutes.  Greater than 50% of time was spent in counseling and coordination of care.  Follow-Up Instructions: Return in about 5 months (around 09/13/2017) for Rheumatoid arthritis.   Bo Merino, MD  Note - This record has been created using Editor, commissioning.  Chart creation errors have been sought, but may not always  have been located. Such creation errors do not reflect on  the standard of medical care.

## 2017-04-04 ENCOUNTER — Encounter: Payer: Self-pay | Admitting: Internal Medicine

## 2017-04-04 ENCOUNTER — Other Ambulatory Visit: Payer: Self-pay

## 2017-04-04 ENCOUNTER — Ambulatory Visit (AMBULATORY_SURGERY_CENTER): Payer: 59 | Admitting: Internal Medicine

## 2017-04-04 VITALS — BP 96/62 | HR 67 | Temp 99.3°F | Resp 12 | Ht 62.0 in | Wt 121.0 lb

## 2017-04-04 DIAGNOSIS — Z1212 Encounter for screening for malignant neoplasm of rectum: Secondary | ICD-10-CM | POA: Diagnosis not present

## 2017-04-04 DIAGNOSIS — Z1211 Encounter for screening for malignant neoplasm of colon: Secondary | ICD-10-CM

## 2017-04-04 MED ORDER — SODIUM CHLORIDE 0.9 % IV SOLN: 500.0000 mL | Freq: Once | INTRAVENOUS | Status: DC

## 2017-04-04 NOTE — Patient Instructions (Signed)
Impression/Recommendations:  Diverticulosis handout given to patient.  Resume previous diet. Continue present medications.  Repeat colonoscopy in 10 years for screening purposes.  YOU HAD AN ENDOSCOPIC PROCEDURE TODAY AT THE  ENDOSCOPY CENTER:   Refer to the procedure report that was given to you for any specific questions about what was found during the examination.  If the procedure report does not answer your questions, please call your gastroenterologist to clarify.  If you requested that your care partner not be given the details of your procedure findings, then the procedure report has been included in a sealed envelope for you to review at your convenience later.  YOU SHOULD EXPECT: Some feelings of bloating in the abdomen. Passage of more gas than usual.  Walking can help get rid of the air that was put into your GI tract during the procedure and reduce the bloating. If you had a lower endoscopy (such as a colonoscopy or flexible sigmoidoscopy) you may notice spotting of blood in your stool or on the toilet paper. If you underwent a bowel prep for your procedure, you may not have a normal bowel movement for a few days.  Please Note:  You might notice some irritation and congestion in your nose or some drainage.  This is from the oxygen used during your procedure.  There is no need for concern and it should clear up in a day or so.  SYMPTOMS TO REPORT IMMEDIATELY:   Following lower endoscopy (colonoscopy or flexible sigmoidoscopy):  Excessive amounts of blood in the stool  Significant tenderness or worsening of abdominal pains  Swelling of the abdomen that is new, acute  Fever of 100F or higher For urgent or emergent issues, a gastroenterologist can be reached at any hour by calling [phone].   DIET:  We do recommend a small meal at first, but then you may proceed to your regular diet.  Drink plenty of fluids but you should avoid alcoholic beverages for 24  hours.  ACTIVITY:  You should plan to take it easy for the rest of today and you should NOT DRIVE or use heavy machinery until tomorrow (because of the sedation medicines used during the test).    FOLLOW UP: Our staff will call the number listed on your records the next business day following your procedure to check on you and address any questions or concerns that you may have regarding the information given to you following your procedure. If we do not reach you, we will leave a message.  However, if you are feeling well and you are not experiencing any problems, there is no need to return our call.  We will assume that you have returned to your regular daily activities without incident.  If any biopsies were taken you will be contacted by phone or by letter within the next 1-3 weeks.  Please call us at [phone] if you have not heard about the biopsies in 3 weeks.    SIGNATURES/CONFIDENTIALITY: You and/or your care partner have signed paperwork which will be entered into your electronic medical record.  These signatures attest to the fact that that the information above on your After Visit Summary has been reviewed and is understood.  Full responsibility of the confidentiality of this discharge information lies with you and/or your care-partner. 

## 2017-04-04 NOTE — Progress Notes (Signed)
Report to PACU, RN, vss, BBS= Clear.  

## 2017-04-04 NOTE — Progress Notes (Signed)
Pt. Had a pocket of discomfort RUQ after being disconnected from monitors.  Walked with pt. To restroom, walked with pt. Around the unit 2 times, gave instructions for home care should discomfort return.  Pt. Felt ready to go home at time of discharge.

## 2017-04-04 NOTE — Progress Notes (Signed)
No changes in medical or surgical hx since PV per pt 

## 2017-04-04 NOTE — Op Note (Signed)
[facility] Patient Name: Vickie [name] Procedure Date: 04/04/2017 8:00 AM MRN: [id number] Endoscopist: Beverley Fiedler , MD Age: 52 Referring MD:  Date of Birth: 09-09-1965 Gender: Female Account #: [id number] Procedure:                Colonoscopy Indications:              Screening for colorectal malignant neoplasm Medicines:                Monitored Anesthesia Care Procedure:                Pre-Anesthesia Assessment:                           - Prior to the procedure, a History and Physical                            was performed, and patient medications and                            allergies were reviewed. The patient's tolerance of                            previous anesthesia was also reviewed. The risks                            and benefits of the procedure and the sedation                            options and risks were discussed with the patient.                            All questions were answered, and informed consent                            was obtained. Prior Anticoagulants: The patient has                            taken no previous anticoagulant or antiplatelet                            agents. ASA Grade Assessment: II - A patient with                            mild systemic disease. After reviewing the risks                            and benefits, the patient was deemed in                            satisfactory condition to undergo the procedure.                           After obtaining informed consent, the colonoscope  was passed under direct vision. Throughout the                            procedure, the patient's blood pressure, pulse, and                            oxygen saturations were monitored continuously. The                            Model PCF-[id number]) scope was introduced                            through the anus and advanced to the the cecum,                            identified by  appendiceal orifice and ileocecal                            valve. The colonoscopy was performed without                            difficulty. The patient tolerated the procedure                            well. The quality of the bowel preparation was                            excellent. The ileocecal valve, appendiceal                            orifice, and rectum were photographed. Scope In: 8:15:15 AM Scope Out: 8:31:47 AM Scope Withdrawal Time: 0 hours 9 minutes 7 seconds  Total Procedure Duration: 0 hours 16 minutes 32 seconds  Findings:                 The digital rectal exam was normal.                           A few small-mouthed diverticula were found in the                            sigmoid colon.                           The exam was otherwise without abnormality on                            direct and retroflexion views. Complications:            No immediate complications. Estimated Blood Loss:     Estimated blood loss: none. Impression:               - Diverticulosis in the sigmoid colon.                           - The examination was otherwise normal on  direct                            and retroflexion views.                           - No specimens collected. Recommendation:           - Patient has a contact number available for                            emergencies. The signs and symptoms of potential                            delayed complications were discussed with the                            patient. Return to normal activities tomorrow.                            Written discharge instructions were provided to the                            patient.                           - Resume previous diet.                           - Continue present medications.                           - Repeat colonoscopy in 10 years for screening                            purposes. Beverley Fiedler, MD 04/04/2017 8:35:10 AM This report has been signed electronically.

## 2017-04-04 NOTE — Progress Notes (Deleted)
Called to room to assist during endoscopic procedure.  Patient ID and intended procedure confirmed with present staff. Received instructions for my participation in the procedure from the performing physician.  

## 2017-04-06 ENCOUNTER — Encounter (HOSPITAL_BASED_OUTPATIENT_CLINIC_OR_DEPARTMENT_OTHER): Payer: Self-pay | Admitting: Cardiovascular Disease

## 2017-04-06 NOTE — Procedures (Signed)
     Patient Name: Vickie [name], Vickie [name] Date: 03/26/2017 Gender: Female D.O.B: 05/06/1965 Age (years): 51 Referring Provider: Chilton Si Height (inches): 62 Interpreting Physician: Nicki Guadalajara MD, ABSM Weight (lbs): 115 RPSGT: Celene Kras BMI: 21 MRN: [id number] Neck Size: 10.00  CLINICAL INFORMATION Sleep Study Type: HST  Indication for sleep study: Snoring, Witnessed apnea (327.23)  Epworth Sleepiness Score: 4  SLEEP STUDY TECHNIQUE A multi-channel overnight portable sleep study was performed. The channels recorded were: nasal airflow, thoracic respiratory movement, and oxygen saturation with a pulse oximetry. Snoring was also monitored.  MEDICATIONS     Adalimumab (HUMIRA PEN) 40 MG/0.4ML PNKT         amLODipine (NORVASC) 5 MG tablet         folic acid (FOLVITE) 1 MG tablet         methotrexate (RHEUMATREX) 2.5 MG tablet      Patient self administered medications include: N/A.  SLEEP ARCHITECTURE Patient was studied for 508.8 minutes. The sleep efficiency was 98.9 % and the patient was supine for 30.6%. The arousal index was 0.0 per hour.  RESPIRATORY PARAMETERS The overall AHI was 10.0 per hour, with a central apnea index of 0.0 per hour.  The oxygen nadir was 84% during sleep.  CARDIAC DATA Mean heart rate during sleep was 68.9 bpm.  IMPRESSIONS - Mild obstructive sleep apnea occurred during this study (AHI 10.0/h); sleep apnea was moderate during supine sleep with an AHI of 22.3/h.  Since this is a home sleep study, steep stage cannot be assessed and REM sleep AHI cannot be determined - No significant central sleep apnea occurred during this study (CAI = 0.0/h). - Moderate oxygen desaturation was noted during this study (Min O2 = 84%). - Patient snored 31.5% during the sleep.  DIAGNOSIS - Obstructive Sleep Apnea (327.23 [G47.33 ICD-10])  RECOMMENDATIONS - In this patient with cardiovascular comorbidities recommend therapeutic CPAP titration to  determine optimal pressure required to alleviate sleep disordered breathing. - Effort should be made to optimize nasal and oral pharyngeal patency - Positional therapy avoiding supine position during sleep. - If patient is against CPAP titration, consider alternative therapy such as customized oral dental appliance. - Avoid alcohol, sedatives and other CNS depressants that may worsen sleep apnea and disrupt normal sleep architecture. - Sleep hygiene should be reviewed to assess factors that may improve sleep quality. - Weight management and regular exercise should be initiated or continued. - Recommend sleep clinic evaluation following CPAP titration or if patient wishes to discuss therapeutic options.   [Electronically signed] 04/06/2017 06:22 PM  Nicki Guadalajara MD, [facility], ABSM Diplomate, American Board of Sleep Medicine   NPI: [id number] [facility] PH: [phone]   FX: [phone] ACCREDITED BY THE AMERICAN ACADEMY OF SLEEP MEDICINE

## 2017-04-07 ENCOUNTER — Telehealth: Payer: Self-pay

## 2017-04-07 NOTE — Telephone Encounter (Signed)
  Follow up Call-  Call back number 04/04/2017  Post procedure Call Back phone  # [phone]  Permission to leave phone message Yes  Some recent data might be hidden     Patient questions:  Do you have a fever, pain , or abdominal swelling? No. Pain Score  0 *  Have you tolerated food without any problems? Yes.    Have you been able to return to your normal activities? Yes.    Do you have any questions about your discharge instructions: Diet   No. Medications  No. Follow up visit  No.  Do you have questions or concerns about your Care? No.  Actions: * If pain score is 4 or above: No action needed, pain <4.

## 2017-04-08 ENCOUNTER — Telehealth: Payer: Self-pay | Admitting: *Deleted

## 2017-04-08 ENCOUNTER — Other Ambulatory Visit: Payer: Self-pay | Admitting: Cardiovascular Disease

## 2017-04-08 ENCOUNTER — Ambulatory Visit (INDEPENDENT_AMBULATORY_CARE_PROVIDER_SITE_OTHER): Payer: 59 | Admitting: Family Medicine

## 2017-04-08 ENCOUNTER — Telehealth: Payer: Self-pay | Admitting: Cardiovascular Disease

## 2017-04-08 ENCOUNTER — Encounter: Payer: Self-pay | Admitting: Family Medicine

## 2017-04-08 VITALS — BP 118/66 | HR 89 | Temp 98.2°F | Resp 12 | Ht 62.0 in | Wt 119.0 lb

## 2017-04-08 DIAGNOSIS — G4733 Obstructive sleep apnea (adult) (pediatric): Secondary | ICD-10-CM

## 2017-04-08 DIAGNOSIS — R0683 Snoring: Secondary | ICD-10-CM

## 2017-04-08 DIAGNOSIS — R300 Dysuria: Secondary | ICD-10-CM

## 2017-04-08 DIAGNOSIS — N390 Urinary tract infection, site not specified: Secondary | ICD-10-CM | POA: Diagnosis not present

## 2017-04-08 LAB — POC URINALSYSI DIPSTICK (AUTOMATED)
BILIRUBIN UA: NEGATIVE
GLUCOSE UA: NEGATIVE
Ketones, UA: NEGATIVE
Nitrite, UA: NEGATIVE
Urobilinogen, UA: 0.2 E.U./dL
pH, UA: 6 (ref 5.0–8.0)

## 2017-04-08 MED ORDER — NITROFURANTOIN MONOHYD MACRO 100 MG PO CAPS: 100.0000 mg | ORAL_CAPSULE | Freq: Two times a day (BID) | ORAL | 0 refills | Status: AC

## 2017-04-08 NOTE — Patient Instructions (Signed)
A few things to remember from today's visit:   Dysuria - Plan: POCT Urinalysis Dipstick (Automated), Urine Culture  Urinary tract infection without hematuria, site unspecified - Plan: nitrofurantoin, macrocrystal-monohydrate, (MACROBID) 100 MG capsule    Adequate fluid intake, avoid holding urine for long hours, and over the counter Vit C OR cranberry capsules might help.  Today we will treat empirically with antibiotic, which we might need to change when urine culture comes back depending of bacteria susceptibility.  Seek immediate medical attention if severe abdominal pain, vomiting, fever/chills, or worsening symptoms. F/U if symptomatic are not any better after 2-3 days of antibiotic treatment.   Please be sure medication list is accurate. If a new problem present, please set up appointment sooner than planned today.

## 2017-04-08 NOTE — Telephone Encounter (Signed)
Discussed sleep study results and recommendations with patient. All questions asked were answered satisfactorily. Patient would like to discuss her options with her husband and call me back with her decision on if she wants to do titration study versus oral appliance.

## 2017-04-08 NOTE — Telephone Encounter (Signed)
-----   Message from Lennette Bihari, MD sent at 04/06/2017  6:29 PM EST ----- Vickie [name] please notify the patient the results of her home sleep study.  Recommend CPAP titration study and follow-up sleep clinic evaluation

## 2017-04-08 NOTE — Telephone Encounter (Signed)
x

## 2017-04-08 NOTE — Telephone Encounter (Signed)
New message    Please call pt with sleep study results

## 2017-04-08 NOTE — Progress Notes (Signed)
HPI:   [name] is a 52 y.o. female, who is here today complaining of urinary symptoms that started yesterday.   Dysuria: Yes,mild Urinary frequency: Yes Urinary urgency: Yes. Incontinence: Denies Gross hematuria: Denies  Abdominal pain: Denies   Nausea or vomiting: Not present.  Abnormal vaginal bleeding or discharge: Denies She denies fever, chills, or body aches.  LMP: Post menopausal. Sexual activity: Yes. Hx of UTI, has not had one in a year but used to have 2-3 per year.  OTC medications for this problem: None   Review of Systems  Constitutional: Negative for activity change, appetite change, fatigue and fever.  Cardiovascular: Negative for leg swelling.  Gastrointestinal: Negative for abdominal pain, nausea and vomiting.       No changes in bowel habits.  Genitourinary: Positive for dysuria, frequency and urgency. Negative for decreased urine volume, hematuria, vaginal bleeding and vaginal discharge.  Musculoskeletal: Negative for back pain and myalgias.  Skin: Negative for rash.  Neurological: Negative for syncope, weakness and headaches.      Current Outpatient Medications on File Prior to Visit  Medication Sig Dispense Refill  . Adalimumab (HUMIRA PEN) 40 MG/0.4ML PNKT Inject 40 mg into the skin every 14 (fourteen) days. 3 each 0  . amLODipine (NORVASC) 5 MG tablet Take 1 tablet (5 mg total) by mouth daily. 90 tablet 1  . folic acid (FOLVITE) 1 MG tablet Take 2 mg by mouth daily.    . methotrexate (RHEUMATREX) 2.5 MG tablet Take 20 mg by mouth once a week.      Current Facility-Administered Medications on File Prior to Visit  Medication Dose Route Frequency Provider Last Rate Last Dose  . 0.9 %  sodium chloride infusion  500 mL Intravenous Once Pyrtle, Carie Caddy, MD         Past Medical History:  Diagnosis Date  . Arthritis    RA Dx at age 71  . Bell's palsy   . Chicken pox   . Hypertension   . PVC (premature ventricular contraction)  01/02/2017  . Snoring 01/02/2017   No Known Allergies  Social History   Socioeconomic History  . Marital status: Married    Spouse name: None  . Number of children: None  . Years of education: None  . Highest education level: None  Social Needs  . Financial resource strain: None  . Food insecurity - worry: None  . Food insecurity - inability: None  . Transportation needs - medical: None  . Transportation needs - non-medical: None  Occupational History  . None  Tobacco Use  . Smoking status: Never Smoker  . Smokeless tobacco: Never Used  Substance and Sexual Activity  . Alcohol use: No    Frequency: Never  . Drug use: No  . Sexual activity: None  Other Topics Concern  . None  Social History Narrative  . None    Vitals:   04/08/17 1109  BP: 118/66  Pulse: 89  Resp: 12  Temp: 98.2 F (36.8 C)  SpO2: 99%   Body mass index is 21.77 kg/m.   Physical Exam  Nursing note and vitals reviewed. Constitutional: She is oriented to person, place, and time. She appears well-developed and well-nourished. No distress.  HENT:  Head: Normocephalic and atraumatic.  Mouth/Throat: Oropharynx is clear and moist. Mucous membranes are dry.  Eyes: Conjunctivae are normal.  Cardiovascular: Normal rate and regular rhythm.  Respiratory: Effort normal and breath sounds normal. No respiratory distress.  GI:  Soft. She exhibits no mass. There is no tenderness. There is no CVA tenderness.  Musculoskeletal: She exhibits no edema or tenderness.  Neurological: She is alert and oriented to person, place, and time. She has normal strength. Gait normal.  Skin: Skin is warm. No rash noted. No erythema.  Psychiatric: Her mood appears anxious.  Well groomed,good eye contact.      ASSESSMENT AND PLAN:   Vickie [name] was seen today for urinary urgency and burning with urination.  Diagnoses and all orders for this visit:  Dysuria -     POCT Urinalysis Dipstick (Automated) -     Urine  Culture  Urinary tract infection without hematuria, site unspecified -     nitrofurantoin, macrocrystal-monohydrate, (MACROBID) 100 MG capsule; Take 1 capsule (100 mg total) by mouth 2 (two) times daily for 5 days.   Urine dipstick abnormal. Other possible causes of reported urinary symptoms discussed. Ucx ordered.   Empiric abx treatment started today and will be tailored according to Ucx results and susceptibility report.  Clearly instructed about warning signs. F/U if symptoms persist.    -Vickie [name] was advised to return or notify a doctor immediately if symptoms worsen or persist or new concerns arise.       Denise Washburn G. [place], MD  [facility]. [place] office.

## 2017-04-09 ENCOUNTER — Telehealth: Payer: Self-pay | Admitting: Cardiovascular Disease

## 2017-04-09 NOTE — Telephone Encounter (Signed)
Pt returning call to [facility] -pls call

## 2017-04-10 ENCOUNTER — Encounter: Payer: Self-pay | Admitting: *Deleted

## 2017-04-10 NOTE — Progress Notes (Signed)
Patient decided to do oral appliance instead of CPAP titration. Referred to Dr Althea Grimmer for consultation.

## 2017-04-10 NOTE — Telephone Encounter (Signed)
Patient returned a call to me to say that after consulting with her husband, she has decided to try the oral appliance to treat her sleep apnea versus having a CPAP titration. Order along with office note and sleep study sent to Dr. Myrtis Ser. Informed patient Dr. Myrtis Ser office will contact her with appointment. Patient voiced understanding.

## 2017-04-11 LAB — URINE CULTURE
MICRO NUMBER: 90186944
SPECIMEN QUALITY:: ADEQUATE

## 2017-04-14 ENCOUNTER — Encounter: Payer: Self-pay | Admitting: Family Medicine

## 2017-04-16 ENCOUNTER — Ambulatory Visit (INDEPENDENT_AMBULATORY_CARE_PROVIDER_SITE_OTHER): Payer: 59 | Admitting: Rheumatology

## 2017-04-16 ENCOUNTER — Encounter: Payer: Self-pay | Admitting: Rheumatology

## 2017-04-16 VITALS — BP 112/73 | HR 84 | Resp 16 | Ht 62.0 in | Wt 118.5 lb

## 2017-04-16 DIAGNOSIS — Z79899 Other long term (current) drug therapy: Secondary | ICD-10-CM

## 2017-04-16 DIAGNOSIS — Z8679 Personal history of other diseases of the circulatory system: Secondary | ICD-10-CM

## 2017-04-16 DIAGNOSIS — T8090XA Unspecified complication following infusion and therapeutic injection, initial encounter: Secondary | ICD-10-CM

## 2017-04-16 DIAGNOSIS — R682 Dry mouth, unspecified: Secondary | ICD-10-CM | POA: Diagnosis not present

## 2017-04-16 DIAGNOSIS — M06322 Rheumatoid nodule, left elbow: Secondary | ICD-10-CM | POA: Diagnosis not present

## 2017-04-16 DIAGNOSIS — M069 Rheumatoid arthritis, unspecified: Secondary | ICD-10-CM

## 2017-04-16 DIAGNOSIS — M0579 Rheumatoid arthritis with rheumatoid factor of multiple sites without organ or systems involvement: Secondary | ICD-10-CM

## 2017-04-16 DIAGNOSIS — M06321 Rheumatoid nodule, right elbow: Secondary | ICD-10-CM | POA: Diagnosis not present

## 2017-04-16 DIAGNOSIS — M7732 Calcaneal spur, left foot: Secondary | ICD-10-CM

## 2017-04-16 DIAGNOSIS — M7731 Calcaneal spur, right foot: Secondary | ICD-10-CM | POA: Diagnosis not present

## 2017-04-16 LAB — CBC WITH DIFFERENTIAL/PLATELET
BASOS PCT: 0.5 %
Basophils Absolute: 33 cells/uL (ref 0–200)
EOS PCT: 0.9 %
Eosinophils Absolute: 59 cells/uL (ref 15–500)
HCT: 41.1 % (ref 35.0–45.0)
Hemoglobin: 14 g/dL (ref 11.7–15.5)
LYMPHS ABS: 2198 {cells}/uL (ref 850–3900)
MCH: 30.6 pg (ref 27.0–33.0)
MCHC: 34.1 g/dL (ref 32.0–36.0)
MCV: 89.9 fL (ref 80.0–100.0)
MPV: 11 fL (ref 7.5–12.5)
Monocytes Relative: 5.6 %
Neutro Abs: 3940 cells/uL (ref 1500–7800)
Neutrophils Relative %: 59.7 %
PLATELETS: 305 10*3/uL (ref 140–400)
RBC: 4.57 10*6/uL (ref 3.80–5.10)
RDW: 12.5 % (ref 11.0–15.0)
TOTAL LYMPHOCYTE: 33.3 %
WBC mixed population: 370 cells/uL (ref 200–950)
WBC: 6.6 10*3/uL (ref 3.8–10.8)

## 2017-04-16 LAB — COMPLETE METABOLIC PANEL WITH GFR
AG RATIO: 1.2 (calc) (ref 1.0–2.5)
ALT: 12 U/L (ref 6–29)
AST: 19 U/L (ref 10–35)
Albumin: 4.2 g/dL (ref 3.6–5.1)
Alkaline phosphatase (APISO): 76 U/L (ref 33–130)
BUN: 17 mg/dL (ref 7–25)
CALCIUM: 9.8 mg/dL (ref 8.6–10.4)
CO2: 25 mmol/L (ref 20–32)
CREATININE: 0.51 mg/dL (ref 0.50–1.05)
Chloride: 106 mmol/L (ref 98–110)
GFR, EST AFRICAN AMERICAN: 128 mL/min/{1.73_m2} (ref >=60)
GFR, Est Non African American: 111 mL/min/{1.73_m2} (ref >=60)
Globulin: 3.4 g/dL (calc) (ref 1.9–3.7)
Glucose, Bld: 117 mg/dL — ABNORMAL HIGH (ref 65–99)
POTASSIUM: 3.8 mmol/L (ref 3.5–5.3)
Sodium: 140 mmol/L (ref 135–146)
TOTAL PROTEIN: 7.6 g/dL (ref 6.1–8.1)
Total Bilirubin: 0.6 mg/dL (ref 0.2–1.2)

## 2017-04-16 MED ORDER — METHOTREXATE 2.5 MG PO TABS: ORAL_TABLET | ORAL | 0 refills | Status: DC

## 2017-04-16 NOTE — Patient Instructions (Signed)
Standing Labs We placed an order today for your standing lab work.    Please come back and get your standing labs in May and every 3 months  We have open lab Monday through Friday from 8:30-11:30 AM and 1:30-4 PM at the office of Dr. Juandiego Kolenovic.   The office is located at [address], [address], [place], [place] [address] No appointment is necessary.   Labs are drawn by [facility].  You may receive a bill from [facility] for your lab work. If you have any questions regarding directions or hours of operation,  please call [phone].    

## 2017-04-24 ENCOUNTER — Other Ambulatory Visit: Payer: Self-pay | Admitting: Family Medicine

## 2017-04-24 DIAGNOSIS — I10 Essential (primary) hypertension: Secondary | ICD-10-CM

## 2017-06-07 ENCOUNTER — Other Ambulatory Visit: Payer: Self-pay | Admitting: Rheumatology

## 2017-06-09 NOTE — Telephone Encounter (Signed)
Last Visit: 04/16/17 Next visit: 09/18/17 Labs: 04/16/17 Glucose is elevated. All other labs are WNL. TB Gold: 10/29/16 Neg   Okay to refill per Dr. Corliss Skains

## 2017-06-26 ENCOUNTER — Other Ambulatory Visit: Payer: Self-pay | Admitting: Rheumatology

## 2017-06-26 NOTE — Telephone Encounter (Signed)
Last Visit: 04/16/17 Next visit: 09/18/17 Labs: 04/16/17 Glucose is elevated. All other labs are WNL.   Okay to refill per Dr. Corliss Skains

## 2017-08-12 ENCOUNTER — Telehealth: Payer: Self-pay | Admitting: Rheumatology

## 2017-08-12 DIAGNOSIS — Z79899 Other long term (current) drug therapy: Secondary | ICD-10-CM

## 2017-08-12 NOTE — Telephone Encounter (Signed)
Patient called stating she received a letter from her insurance company stating they had updated benefits allowing her to receive a 3 month supply (6 pens) of Humira.  Patient states the letter stated she should check with her physician's office.

## 2017-08-13 NOTE — Telephone Encounter (Signed)
Attempted to contact the patient and left message for patient to call the office.  

## 2017-08-13 NOTE — Telephone Encounter (Signed)
Patient advised that we send in a 3 month prescription as long as her labs are up to date. Patient advised she is due for labs now. She states she will update by the end of the week.

## 2017-08-13 NOTE — Addendum Note (Signed)
Addended by: Henriette Combs on: 08/13/2017 08:49 AM   Modules accepted: Orders

## 2017-08-14 ENCOUNTER — Other Ambulatory Visit (INDEPENDENT_AMBULATORY_CARE_PROVIDER_SITE_OTHER): Payer: Self-pay | Admitting: *Deleted

## 2017-08-14 DIAGNOSIS — Z79899 Other long term (current) drug therapy: Secondary | ICD-10-CM

## 2017-08-14 LAB — COMPLETE METABOLIC PANEL WITH GFR
AG Ratio: 1.2 (calc) (ref 1.0–2.5)
ALKALINE PHOSPHATASE (APISO): 84 U/L (ref 33–130)
ALT: 14 U/L (ref 6–29)
AST: 18 U/L (ref 10–35)
Albumin: 4.2 g/dL (ref 3.6–5.1)
BUN: 16 mg/dL (ref 7–25)
CO2: 28 mmol/L (ref 20–32)
CREATININE: 0.57 mg/dL (ref 0.50–1.05)
Calcium: 9.5 mg/dL (ref 8.6–10.4)
Chloride: 105 mmol/L (ref 98–110)
GFR, Est African American: 124 mL/min/{1.73_m2} (ref >=60)
GFR, Est Non African American: 107 mL/min/{1.73_m2} (ref >=60)
GLUCOSE: 83 mg/dL (ref 65–99)
Globulin: 3.4 g/dL (calc) (ref 1.9–3.7)
Potassium: 4.3 mmol/L (ref 3.5–5.3)
Sodium: 140 mmol/L (ref 135–146)
Total Bilirubin: 0.6 mg/dL (ref 0.2–1.2)
Total Protein: 7.6 g/dL (ref 6.1–8.1)

## 2017-08-14 LAB — CBC WITH DIFFERENTIAL/PLATELET
BASOS PCT: 0.7 %
Basophils Absolute: 32 cells/uL (ref 0–200)
Eosinophils Absolute: 50 cells/uL (ref 15–500)
Eosinophils Relative: 1.1 %
HEMATOCRIT: 41.1 % (ref 35.0–45.0)
Hemoglobin: 13.9 g/dL (ref 11.7–15.5)
Lymphs Abs: 1764 cells/uL (ref 850–3900)
MCH: 30.7 pg (ref 27.0–33.0)
MCHC: 33.8 g/dL (ref 32.0–36.0)
MCV: 90.7 fL (ref 80.0–100.0)
MPV: 10.6 fL (ref 7.5–12.5)
Monocytes Relative: 7.9 %
NEUTROS ABS: 2300 {cells}/uL (ref 1500–7800)
Neutrophils Relative %: 51.1 %
PLATELETS: 304 10*3/uL (ref 140–400)
RBC: 4.53 10*6/uL (ref 3.80–5.10)
RDW: 12.7 % (ref 11.0–15.0)
TOTAL LYMPHOCYTE: 39.2 %
WBC: 4.5 10*3/uL (ref 3.8–10.8)
WBCMIX: 356 {cells}/uL (ref 200–950)

## 2017-08-18 ENCOUNTER — Telehealth: Payer: Self-pay | Admitting: *Deleted

## 2017-08-18 MED ORDER — ADALIMUMAB 40 MG/0.4ML ~~LOC~~ AJKT: 40.0000 mg | AUTO-INJECTOR | SUBCUTANEOUS | 0 refills | Status: DC

## 2017-08-18 NOTE — Telephone Encounter (Signed)
-----   Message from Reine Just, RT sent at 08/15/2017  3:53 PM EDT ----- Pt would like humira 90 day supply sent to pharmacy ----- Message ----- From: Henriette Combs, LPN Sent: 9/39/0300  10:06 AM To: Reine Just, RT

## 2017-08-18 NOTE — Telephone Encounter (Signed)
Last Visit: 04/16/17 Next visit: 09/18/17 TB Gold: 10/29/16 Neg  Labs: 08/14/17 WNL

## 2017-08-25 ENCOUNTER — Other Ambulatory Visit: Payer: Self-pay | Admitting: Rheumatology

## 2017-08-25 ENCOUNTER — Other Ambulatory Visit: Payer: Self-pay | Admitting: Family Medicine

## 2017-08-25 ENCOUNTER — Telehealth: Payer: Self-pay | Admitting: Rheumatology

## 2017-08-25 DIAGNOSIS — I10 Essential (primary) hypertension: Secondary | ICD-10-CM

## 2017-08-25 NOTE — Telephone Encounter (Signed)
Patient called stating she received an email from [facility] stating her prescription refill of Methotrexate has not been processed and has been cancelled due to not receiving information from her physician's office.  Patient is requesting a return call.

## 2017-08-25 NOTE — Telephone Encounter (Signed)
Patient advised that her prescription is to early to refill . Patient verbalized understanding.

## 2017-08-26 ENCOUNTER — Encounter: Payer: Self-pay | Admitting: Family Medicine

## 2017-08-26 ENCOUNTER — Ambulatory Visit (INDEPENDENT_AMBULATORY_CARE_PROVIDER_SITE_OTHER): Payer: 59 | Admitting: Family Medicine

## 2017-08-26 VITALS — BP 116/76 | HR 92 | Temp 98.5°F | Resp 12 | Ht 62.0 in | Wt 121.0 lb

## 2017-08-26 DIAGNOSIS — R0683 Snoring: Secondary | ICD-10-CM | POA: Diagnosis not present

## 2017-08-26 DIAGNOSIS — I10 Essential (primary) hypertension: Secondary | ICD-10-CM

## 2017-08-26 DIAGNOSIS — M25552 Pain in left hip: Secondary | ICD-10-CM | POA: Diagnosis not present

## 2017-08-26 MED ORDER — AMLODIPINE BESYLATE 5 MG PO TABS: 5.0000 mg | ORAL_TABLET | Freq: Every day | ORAL | 3 refills | Status: DC

## 2017-08-26 MED ORDER — DICLOFENAC SODIUM ER 100 MG PO TB24: 100.0000 mg | ORAL_TABLET | Freq: Every day | ORAL | 0 refills | Status: AC

## 2017-08-26 NOTE — Assessment & Plan Note (Signed)
Diagnosed with mild OSA. She has noted improvement since she has been wearing mouthguard piece. She is currently following with dentist/orthodontist.

## 2017-08-26 NOTE — Assessment & Plan Note (Signed)
Adequately controlled. For now I recommend no changes in current management. We discussed possible complications of poorly controlled hypertension. BP goal <130/80. Recommend continuing monitoring BP at home and if frequent BP less than 100/60 we could consider decreasing amlodipine dose. Eye exam annually. Given the fact she follows with rheumatologist periodically and has lab work every 3 months that includes BMP, I think is appropriate to follow annually, before if needed.

## 2017-08-26 NOTE — Progress Notes (Signed)
[name] is a 52 y.o.female, who is here today to follow on HTN.  Currently she is on amlodipine 5 mg daily. She was diagnosed with hypertension in 05/2016 after starting OCPs,  BP was persistently elevated after discontinuing medication.  She wonders if she still needs to take medication. She is not checking BP at home but BP has been in normal range during visit with her rheumatologist.  She is taking medications as instructed, no side effects reported.  She has not noted unusual headache, visual changes, exertional chest pain, dyspnea,  focal weakness, or edema.    Lab Results  Component Value Date   CREATININE 0.57 08/14/2017   BUN 16 08/14/2017   NA 140 08/14/2017   K 4.3 08/14/2017   CL 105 08/14/2017   CO2 28 08/14/2017   Since her last visit she has been diagnosed with mild OSA, she is wearing a mouthguard.  She is following regularly with dentist.  It has been recommended to sleep on her side instead of doing on her back as part of the treatment for OSA/snoring.  Since she has been sleeping on left side she has noted left hip pain.  Pain seems to be exacerbated by sleeping on her left side. She wakes up in the middle of the night with moderate pain on lateral aspect of left hip. She does not have any problem during the day. She has not try OTC medication. Negative for lower back pain, numbness or tingling. She has not noted local edema, erythema, or rash.    Review of Systems  Constitutional: Negative for activity change, appetite change, fatigue and fever.  HENT: Negative for mouth sores, nosebleeds and trouble swallowing.   Eyes: Negative for redness and visual disturbance.  Respiratory: Negative for cough, shortness of breath and wheezing.   Cardiovascular: Negative for chest pain, palpitations and leg swelling.  Gastrointestinal: Negative for abdominal pain, nausea and vomiting.       Negative for changes in bowel habits.  Genitourinary: Negative  for decreased urine volume, dysuria and hematuria.  Musculoskeletal: Positive for arthralgias. Negative for gait problem.  Skin: Negative for rash and wound.  Neurological: Negative for syncope, weakness, numbness and headaches.  Psychiatric/Behavioral: Positive for sleep disturbance. Negative for confusion.     Current Outpatient Medications on File Prior to Visit  Medication Sig Dispense Refill  . Adalimumab (HUMIRA PEN) 40 MG/0.4ML PNKT Inject 40 mg into the skin every 14 (fourteen) days. 3 each 0  . folic acid (FOLVITE) 1 MG tablet Take 2 mg by mouth daily.    . methotrexate (RHEUMATREX) 2.5 MG tablet Take 20 mg by mouth once a week.     . methotrexate (RHEUMATREX) 2.5 MG tablet TAKE 8 TABLETS BY MOUTH,  ONCE WEEKLY. CAUTION:  CHEMOTHERAPY. PROTECT FROM  LIGHT. 96 tablet 0   Current Facility-Administered Medications on File Prior to Visit  Medication Dose Route Frequency Provider Last Rate Last Dose  . 0.9 %  sodium chloride infusion  500 mL Intravenous Once Pyrtle, Carie Caddy, MD         Past Medical History:  Diagnosis Date  . Arthritis    RA Dx at age 76  . Bell's palsy   . Chicken pox   . Hypertension   . PVC (premature ventricular contraction) 01/02/2017  . Snoring 01/02/2017    No Known Allergies  Social History   Socioeconomic History  . Marital status: Married    Spouse name: Not on file  .  Number of children: Not on file  . Years of education: Not on file  . Highest education level: Not on file  Occupational History  . Not on file  Social Needs  . Financial resource strain: Not on file  . Food insecurity:    Worry: Not on file    Inability: Not on file  . Transportation needs:    Medical: Not on file    Non-medical: Not on file  Tobacco Use  . Smoking status: Never Smoker  . Smokeless tobacco: Never Used  Substance and Sexual Activity  . Alcohol use: No    Frequency: Never  . Drug use: No  . Sexual activity: Not on file  Lifestyle  . Physical  activity:    Days per week: Not on file    Minutes per session: Not on file  . Stress: Not on file  Relationships  . Social connections:    Talks on phone: Not on file    Gets together: Not on file    Attends religious service: Not on file    Active member of club or organization: Not on file    Attends meetings of clubs or organizations: Not on file    Relationship status: Not on file  Other Topics Concern  . Not on file  Social History Narrative  . Not on file    Vitals:   08/26/17 1056  BP: 116/76  Pulse: 92  Resp: 12  Temp: 98.5 F (36.9 C)  SpO2: 99%   Body mass index is 22.13 kg/m.   Physical Exam  Nursing note and vitals reviewed. Constitutional: She is oriented to person, place, and time. She appears well-developed and well-nourished. No distress.  HENT:  Head: Normocephalic and atraumatic.  Mouth/Throat: Oropharynx is clear and moist and mucous membranes are normal.  Eyes: Pupils are equal, round, and reactive to light. Conjunctivae are normal.  Cardiovascular: Normal rate and regular rhythm.  No murmur heard. Pulses:      Dorsalis pedis pulses are 2+ on the right side, and 2+ on the left side.  Respiratory: Effort normal and breath sounds normal. No respiratory distress.  GI: Soft. She exhibits no mass. There is no hepatomegaly. There is no tenderness.  Musculoskeletal: She exhibits no edema.       Left hip: She exhibits normal range of motion, normal strength and no tenderness.  Tenderness upon palpation of great trochanteric area left hip. Range of motion does not elicit pain.  Lymphadenopathy:    She has no cervical adenopathy.  Neurological: She is alert and oriented to person, place, and time. She has normal strength. Gait normal.  Skin: Skin is warm. No rash noted. No erythema.  Psychiatric: She has a normal mood and affect.  Well groomed, good eye contact.    ASSESSMENT AND PLAN:   Vickie [name] was seen today for follow-up.  Diagnoses and all  orders for this visit:  Left hip pain  We discussed possible causes, clinical history and findings on examination suggest trochanteric bursitis, mild. We discussed treatment options. After discussion of some side effects, she agrees with trying NSAIDs for 7 to 10 days. She needs to monitor BP periodically. If not better steroid injection in the trochanteric bursa can be an option, she is following with rheumatologist in 2 weeks.  -     Diclofenac Sodium CR 100 MG 24 hr tablet; Take 1 tablet (100 mg total) by mouth daily with breakfast for 10 days.  Hypertension, essential Adequately controlled.  For now I recommend no changes in current management. We discussed possible complications of poorly controlled hypertension. BP goal <130/80. Recommend continuing monitoring BP at home and if frequent BP less than 100/60 we could consider decreasing amlodipine dose. Eye exam annually. Given the fact she follows with rheumatologist periodically and has lab work every 3 months that includes BMP, I think is appropriate to follow annually, before if needed.   Snoring Diagnosed with mild OSA. She has noted improvement since she has been wearing mouthguard piece. She is currently following with dentist/orthodontist.        G. [place], MD  [facility]. [place] office.

## 2017-08-26 NOTE — Patient Instructions (Signed)
A few things to remember from today's visit:   Left hip pain - Plan: Diclofenac Sodium CR 100 MG 24 hr tablet  Hypertension, essential - Plan: amLODipine (NORVASC) 5 MG tablet  ? trochanteric bursitis. Monitor blood pressure periodically.    Please be sure medication list is accurate. If a new problem present, please set up appointment sooner than planned today.

## 2017-09-04 NOTE — Progress Notes (Signed)
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 female with history of seropositive rheumatoid arthritis.  Patient is on Humira 40 mg subcutaneous injections every other week, methotrexate 8 tablets by mouth once weekly and folic acid 2 mg daily.  She has been tolerating medications well and has not missed any doses recently.  She states that on Friday she was doing repetitive motions at work which is led to increased right wrist pain.  She denies any joint swelling.  She states that typically when she does overuse activities the pain resolves within 1 day but it has continued.  She denies any pain or swelling in her hands or any other joints at this time.  She states it is somewhat difficult to make a fist with her right hand due to increased stiffness.  She denies any recent rheumatoid arthritis flares.  She states that she has not had a yearly skin exam recently and will be scheduled with a dermatologist soon. She states that about 1 month ago she developed pain on the right side of her thigh that radiated down her left leg.  She states that she has been try to sleep with a pillow on her side but wakes up several times a night in pain.  She denies experiencing any symptoms like this in the past.  She states that lately she has been wearing an appliance at night for sleep apnea which has been requiring her to sleep on her sides which may be contributing to the pain.  She states she saw her PCP who prescribed a medication but she has not sure the name of the medication but she took it for 10 days but did not notice any benefit.    Activities of Daily Living:  Patient reports morning stiffness for 0 minutes.   Patient Reports  nocturnal pain.  Difficulty dressing/grooming: Denies Difficulty climbing stairs: Denies Difficulty getting out of chair: Denies Difficulty using hands for taps, buttons, cutlery, and/or writing: Reports  Review of Systems  Constitutional: Positive for fatigue.  HENT: Negative for mouth sores, trouble swallowing, trouble swallowing, mouth dryness and nose dryness.   Eyes: Negative for pain, visual disturbance and dryness.  Respiratory: Negative for cough, hemoptysis, shortness of breath and difficulty breathing.   Cardiovascular: Negative for chest pain, palpitations, hypertension and swelling in legs/feet.  Gastrointestinal: Negative for abdominal pain, blood in stool, constipation and diarrhea.  Endocrine: Negative for increased urination.  Genitourinary: Negative for painful urination and pelvic pain.  Musculoskeletal: Positive for arthralgias and joint pain. Negative for joint swelling, myalgias, muscle weakness, morning stiffness, muscle tenderness and myalgias.  Skin: Negative for color change, pallor, rash, hair loss, nodules/bumps, skin tightness, ulcers and sensitivity to sunlight.  Allergic/Immunologic: Negative for susceptible to infections.  Neurological: Negative for dizziness, light-headedness, numbness, headaches, memory loss and weakness.  Hematological: Negative for bruising/bleeding tendency and swollen glands.  Psychiatric/Behavioral: Negative for depressed mood, confusion and sleep disturbance. The patient is not nervous/anxious.     PMFS History:  Patient Active Problem List   Diagnosis Date Noted  . PVC (  premature ventricular contraction) 01/02/2017  . Snoring 01/02/2017  . High risk medication use 10/24/2016  . Rheumatoid nodule, left elbow ([place]) 10/24/2016  . Rheumatoid nodule, right elbow ([place]) 10/24/2016  . Calcaneal spur of both feet 10/24/2016  . Dry mouth 10/24/2016  . Rheumatoid arthritis ([place]) 08/07/2016  . Hypertension, essential 08/07/2016    Past  Medical History:  Diagnosis Date  . Arthritis    RA Dx at age 82  . Bell's palsy   . Chicken pox   . Hypertension   . PVC (premature ventricular contraction) 01/02/2017  . Snoring 01/02/2017    Family History  Problem Relation Age of Onset  . Rheum arthritis Mother   . Diabetes Neg Hx   . Hypertension Neg Hx   . Hyperlipidemia Neg Hx   . Stroke Neg Hx   . Colon cancer Neg Hx    Past Surgical History:  Procedure Laterality Date  . CESAREAN SECTION    . FOOT SURGERY     Social History   Social History Narrative  . Not on file    Objective: Vital Signs: BP 118/79 (BP Location: Left Arm, Patient Position: Sitting, Cuff Size: Normal)   Pulse 85   Resp 12   Ht '5\' 2"'  (1.575 m)   Wt 123 lb (55.8 kg)   BMI 22.50 kg/m    Physical Exam  Constitutional: She is oriented to person, place, and time. She appears well-developed and well-nourished.  HENT:  Head: Normocephalic and atraumatic.  Eyes: Conjunctivae and EOM are normal.  Neck: Normal range of motion.  Cardiovascular: Normal rate, regular rhythm, normal heart sounds and intact distal pulses.  Pulmonary/Chest: Effort normal and breath sounds normal.  Abdominal: Soft. Bowel sounds are normal.  Lymphadenopathy:    She has no cervical adenopathy.  Neurological: She is alert and oriented to person, place, and time.  Skin: Skin is warm and dry. Capillary refill takes less than 2 seconds.  Psychiatric: She has a normal mood and affect. Her behavior is normal.  Nursing note and vitals reviewed.    Musculoskeletal Exam: C-spine, thoracic spine, lumbar spine good range of motion.  No midline spinal tenderness.  No SI joint tenderness.  Shoulder joints, elbow joints good range of motion with no synovitis.  She has slightly limited range of motion of her right wrist with tenderness on exam.  MCPs, PIPs, DIPs good range of motion with no synovitis.  She has ulnar deviation bilaterally.  Hip joints, knee joints, ankle joints, MTPs,  PIPs, DIPs good range of motion with no synovitis.  She has tenderness of the left trochanteric bursa on exam.  No warmth or effusion of bilateral knee joints.  No tenderness or warmth of the bilateral ankle joints.  CDAI Exam: CDAI Homunculus Exam:   Tenderness:  RUE: wrist  Joint Counts:  CDAI Tender Joint count: 1 CDAI Swollen Joint count: 0  Global Assessments:  Patient Global Assessment: 2 Provider Global Assessment: 2  CDAI Calculated Score: 5   Investigation: No additional findings.  Imaging: No results found.  Recent Labs: TB Gold: 10/29/2016 Negative  Lab Results  Component Value Date   WBC 4.5 08/14/2017   HGB 13.9 08/14/2017   PLT 304 08/14/2017   NA 140 08/14/2017   K 4.3 08/14/2017   CL 105 08/14/2017   CO2 28 08/14/2017   GLUCOSE 83 08/14/2017   BUN 16 08/14/2017   CREATININE 0.57 08/14/2017   BILITOT 0.6 08/14/2017   ALKPHOS 79 01/02/2017   AST  18 08/14/2017   ALT 14 08/14/2017   PROT 7.6 08/14/2017   ALBUMIN 4.3 01/02/2017   CALCIUM 9.5 08/14/2017   GFRAA 124 08/14/2017    Speciality Comments: No specialty comments available.  Procedures:  Large Joint Inj: L greater trochanter on 09/18/2017 10:38 AM Indications: pain Details: 27 G 1.5 in needle, lateral approach  Arthrogram: No  Medications: 1 mL lidocaine 1 %; 40 mg triamcinolone acetonide 40 MG/ML Aspirate: 0 mL Outcome: tolerated well, no immediate complications Procedure, treatment alternatives, risks and benefits explained, specific risks discussed. Consent was given by the patient. Immediately prior to procedure a time out was called to verify the correct patient, procedure, equipment, support staff and site/side marked as required. Patient was prepped and draped in the usual sterile fashion.     Allergies: Patient has no known allergies.   Assessment / Plan:     Visit Diagnoses: Rheumatoid arthritis involving multiple sites with positive rheumatoid factor (HCC) - Positive RF,  positive anti-CCP, elevated ESR, erosive disease with nodulosis: She has no synovitis on exam.  She has not had any recent rheumatoid arthritis flares.  No nodulocystic noted.  She was performing repetitive activities at work on Friday, which led to increased pain and stiffness in the right wrist joint.  She has limited range of motion of bilateral wrist.  She has incomplete fist formation of the right hand on exam. No MCP or PIP tenderness or synovitis noted.  She has synovial thickening of MCPs and PIP joints.  Ulnar deviation bilaterally.  She continues to inject Humira 40 mg of cutaneously every other week, takes methotrexate 8 tablets by mouth once weekly and folic acid 2 mg daily.  She has not missed any recent doses of her medications and has been tolerating them well.  She is not needing refills at this time.  Patient was advised to follow-up with a dermatologist for yearly skin exams and she is on Humira.  TB gold will also be due in September 2019.  She will be given a prescription for Voltaren gel which can apply topically to her right wrist.  Potential side effects were discussed.  A prescription was sent to the pharmacy today.  High risk medication use - Humira 40 mg subcu every other week, MTX 8 tabs p.o. weekly, folic acid 2 mg p.o. Daily: CBC and CMP were drawn on 08/14/2017 and were within normal limits.  She will be due for labs in September to monitor for drug toxicity.  Standing orders for CBC and CMP are in place.  Future order for TB gold was placed today.- Plan: QuantiFERON-TB Gold Plus  Rheumatoid nodule, left elbow [facility]): Resolved  Rheumatoid nodule, right elbow [facility]): Resolved   Trochanteric bursitis, left hip: She has tenderness of the left trochanter bursa on exam today.  She has had symptoms for the past 1 month.  She saw her PCP who prescribed a medication but she is unsure with the medication was and took it for 10 days and did not notice any benefit.  She finished the  medication about 1 week ago.  She has pain that is radiating down the left IT band.  She requested a cortisone injection today in the office.  She tolerated procedure well.  Potential side effects were discussed.  She was given a handout of trochanteric bursitis and IT band exercises that she can perform at home.  We discussed physical therapy if her symptoms continue.  It band syndrome, left: She has tenderness radiating on  the left IT band.  She is given stretching exercises that she can perform at home.  A left trochanteric bursitis injection was performed today.  If she continues to have symptoms we will refer her to physical therapy.  Calcaneal spur of both feet: She has no feet discomfort at this time.    Other medical conditions are listed as follows:  Dry mouth  History of hypertension    Orders: Orders Placed This Encounter  Procedures  . Large Joint Inj  . QuantiFERON-TB Gold Plus   No orders of the defined types were placed in this encounter.   Face-to-face time spent with patient was 30 minutes. Greater than 50% of time was spent in counseling and coordination of care.  Follow-Up Instructions: Return in about 5 months (around 02/18/2018) for Rheumatoid arthritis.   Ofilia Neas, PA-C  Note - This record has been created using Dragon software.  Chart creation errors have been sought, but may not always  have been located. Such creation errors do not reflect on  the standard of medical care.

## 2017-09-18 ENCOUNTER — Encounter: Payer: Self-pay | Admitting: Physician Assistant

## 2017-09-18 ENCOUNTER — Ambulatory Visit (INDEPENDENT_AMBULATORY_CARE_PROVIDER_SITE_OTHER): Payer: 59 | Admitting: Physician Assistant

## 2017-09-18 VITALS — BP 118/79 | HR 85 | Resp 12 | Ht 62.0 in | Wt 123.0 lb

## 2017-09-18 DIAGNOSIS — R682 Dry mouth, unspecified: Secondary | ICD-10-CM

## 2017-09-18 DIAGNOSIS — M7731 Calcaneal spur, right foot: Secondary | ICD-10-CM

## 2017-09-18 DIAGNOSIS — M06321 Rheumatoid nodule, right elbow: Secondary | ICD-10-CM | POA: Diagnosis not present

## 2017-09-18 DIAGNOSIS — Z8679 Personal history of other diseases of the circulatory system: Secondary | ICD-10-CM

## 2017-09-18 DIAGNOSIS — M7632 Iliotibial band syndrome, left leg: Secondary | ICD-10-CM

## 2017-09-18 DIAGNOSIS — Z79899 Other long term (current) drug therapy: Secondary | ICD-10-CM

## 2017-09-18 DIAGNOSIS — M7062 Trochanteric bursitis, left hip: Secondary | ICD-10-CM

## 2017-09-18 DIAGNOSIS — M06322 Rheumatoid nodule, left elbow: Secondary | ICD-10-CM | POA: Diagnosis not present

## 2017-09-18 DIAGNOSIS — M0579 Rheumatoid arthritis with rheumatoid factor of multiple sites without organ or systems involvement: Secondary | ICD-10-CM

## 2017-09-18 DIAGNOSIS — M7732 Calcaneal spur, left foot: Secondary | ICD-10-CM

## 2017-09-18 MED ORDER — DICLOFENAC SODIUM 1 % TD GEL: TRANSDERMAL | 3 refills | Status: DC

## 2017-09-18 MED ORDER — LIDOCAINE HCL 1 % IJ SOLN
1.0000 mL | INTRAMUSCULAR | Status: AC | PRN
  Administered 2017-09-18: 1 mL

## 2017-09-18 MED ORDER — TRIAMCINOLONE ACETONIDE 40 MG/ML IJ SUSP
40.0000 mg | INTRAMUSCULAR | Status: AC | PRN
  Administered 2017-09-18: 40 mg via INTRA_ARTICULAR

## 2017-09-18 NOTE — Patient Instructions (Addendum)
Standing Labs We placed an order today for your standing lab work.    Please come back and get your standing labs in September and every 3 months   TB gold, CBC, and CMP  We have open lab Monday through Friday from 8:30-11:30 AM and 1:30-4:00 PM  at the office of Dr. Pollyann Savoy.   You may experience shorter wait times on Monday and Friday afternoons. The office is located at [address], [address], [place], [place] [address] No appointment is necessary.   Labs are drawn by [facility].  You may receive a bill from [facility] for your lab work. If you have any questions regarding directions or hours of operation,  please call [phone].    Trochanteric Bursitis Trochanteric bursitis is a condition that causes hip pain. Trochanteric bursitis happens when fluid-filled sacs (bursae) in the hip get irritated. Normally these sacs absorb shock and help strong bands of tissue (tendons) in your hip glide smoothly over each other and over your hip bones. What are the causes? This condition results from increased friction between the hip bones and the tendons that go over them. This condition can happen if you:  Have weak hips.  Use your hip muscles too much (overuse).  Get hit in the hip.  What increases the risk? This condition is more likely to develop in:  Women.  Adults who are middle-aged or older.  People with arthritis or a spinal condition.  People with weak buttocks muscles (gluteal muscles).  People who have one leg that is shorter than the other.  People who participate in certain kinds of athletic activities, such as: ? Running sports, especially long-distance running. ? Contact sports, like football or martial arts. ? Sports in which falls may occur, like skiing.  What are the signs or symptoms? The main symptom of this condition is pain and tenderness over the point of your hip. The pain may be:  Sharp and intense.  Dull and achy.  Felt on the outside of  your thigh.  It may increase when you:  Lie on your side.  Walk or run.  Go up on stairs.  Sit.  Stand up after sitting.  Stand for long periods of time.  How is this diagnosed? This condition may be diagnosed based on:  Your symptoms.  Your medical history.  A physical exam.  Imaging tests, such as: ? X-rays to check your bones. ? An MRI or ultrasound to check your tendons and muscles.  During your physical exam, your health care provider will check the movement and strength of your hip. He or she may press on the point of your hip to check for pain. How is this treated? This condition may be treated by:  Resting.  Reducing your activity.  Avoiding activities that cause pain.  Using crutches, a cane, or a walker to decrease the strain on your hip.  Taking medicine to help with swelling.  Having medicine injected into the bursae to help with swelling.  Using ice, heat, and massage therapy for pain relief.  Physical therapy exercises for strength and flexibility.  Surgery (rare).  Follow these instructions at home: Activity  Rest.  Avoid activities that cause pain.  Return to your normal activities as told by your health care provider. Ask your health care provider what activities are safe for you. Managing pain, stiffness, and swelling  Take over-the-counter and prescription medicines only as told by your health care provider.  If directed, apply heat to the  injured area as told by your health care provider. ? Place a towel between your skin and the heat source. ? Leave the heat on for 20-30 minutes. ? Remove the heat if your skin turns bright red. This is especially important if you are unable to feel pain, heat, or cold. You may have a greater risk of getting burned.  If directed, apply ice to the injured area: ? Put ice in a plastic bag. ? Place a towel between your skin and the bag. ? Leave the ice on for 20 minutes, 2-3 times a day. General  instructions  If the affected leg is one that you use for driving, ask your health care provider when it is safe to drive.  Use crutches, a cane, or a walker as told by your health care provider.  If one of your legs is shorter than the other, get fitted for a shoe insert.  Lose weight if you are overweight. How is this prevented?  Wear supportive footwear that is appropriate for your sport.  If you have hip pain, start any new exercise or sport slowly.  Maintain physical fitness, including: ? Strength. ? Flexibility. Contact a health care provider if:  Your pain does not improve with 2-4 weeks. Get help right away if:  You develop severe pain.  You have a fever.  You develop increased redness over your hip.  You have a change in your bowel function or bladder function.  You cannot control the muscles in your feet. This information is not intended to replace advice given to you by your health care provider. Make sure you discuss any questions you have with your health care provider. Document Released: 03/21/2004 Document Revised: 10/18/2015 Document Reviewed: 01/27/2015 Elsevier Interactive Patient Education  2018 [employer]   Iliotibial Band Syndrome Rehab Ask your health care provider which exercises are safe for you. Do exercises exactly as told by your health care provider and adjust them as directed. It is normal to feel mild stretching, pulling, tightness, or discomfort as you do these exercises, but you should stop right away if you feel sudden pain or your pain gets worse.Do not begin these exercises until told by your health care provider. Stretching and range of motion exercises These exercises warm up your muscles and joints and improve the movement and flexibility of your hip and pelvis. Exercise A: Quadriceps, prone  1. Lie on your abdomen on a firm surface, such as a bed or padded floor. 2. Bend your left / right knee and hold your ankle. If you cannot  reach your ankle or pant leg, loop a belt around your foot and grab the belt instead. 3. Gently pull your heel toward your buttocks. Your knee should not slide out to the side. You should feel a stretch in the front of your thigh and knee. 4. Hold this position for __________ seconds. Repeat __________ times. Complete this stretch __________ times a day. Exercise B: Iliotibial band  1. Lie on your side with your left / right leg in the top position. 2. Bend both of your knees and grab your left / right ankle. Stretch out your bottom arm to help you balance. 3. Slowly bring your top knee back so your thigh goes behind your trunk. 4. Slowly lower your top leg toward the floor until you feel a gentle stretch on the outside of your left / right hip and thigh. If you do not feel a stretch and your knee will not fall  farther, place the heel of your other foot on top of your knee and pull your knee down toward the floor with your foot. 5. Hold this position for __________ seconds. Repeat __________ times. Complete this stretch __________ times a day. Strengthening exercises These exercises build strength and endurance in your hip and pelvis. Endurance is the ability to use your muscles for a long time, even after they get tired. Exercise C: Straight leg raises ( hip abductors) 1. Lie on your side with your left / right leg in the top position. Lie so your head, shoulder, knee, and hip line up. You may bend your bottom knee to help you balance. 2. Roll your hips slightly forward so your hips are stacked directly over each other and your left / right knee is facing forward. 3. Tense the muscles in your outer thigh and lift your top leg 4-6 inches (10-15 cm). 4. Hold this position for __________ seconds. 5. Slowly return to the starting position. Let your muscles relax completely before doing another repetition. Repeat __________ times. Complete this exercise __________ times a day. Exercise D: Straight  leg raises ( hip extensors) 1. Lie on your abdomen on your bed or a firm surface. You can put a pillow under your hips if that is more comfortable. 2. Bend your left / right knee so your foot is straight up in the air. 3. Squeeze your buttock muscles and lift your left / right thigh off the bed. Do not let your back arch. 4. Tense this muscle as hard as you can without increasing any knee pain. 5. Hold this position for __________ seconds. 6. Slowly lower your leg to the starting position and allow it to relax completely. Repeat __________ times. Complete this exercise __________ times a day. Exercise E: Hip hike 1. Stand sideways on a bottom step. Stand on your left / right leg with your other foot unsupported next to the step. You can hold onto the railing or wall if needed for balance. 2. Keep your knees straight and your torso square. Then, lift your left / right hip up toward the ceiling. 3. Slowly let your left / right hip lower toward the floor, past the starting position. Your foot should get closer to the floor. Do not lean or bend your knees. Repeat __________ times. Complete this exercise __________ times a day. This information is not intended to replace advice given to you by your health care provider. Make sure you discuss any questions you have with your health care provider. Document Released: 02/11/2005 Document Revised: 10/17/2015 Document Reviewed: 01/13/2015 Elsevier Interactive Patient Education  2018 [employer].  Trochanteric Bursitis Rehab Ask your health care provider which exercises are safe for you. Do exercises exactly as told by your health care provider and adjust them as directed. It is normal to feel mild stretching, pulling, tightness, or discomfort as you do these exercises, but you should stop right away if you feel sudden pain or your pain gets worse.Do not begin these exercises until told by your health care provider. Stretching exercises These exercises  warm up your muscles and joints and improve the movement and flexibility of your hip. These exercises also help to relieve pain and stiffness. Exercise A: Iliotibial band stretch  1. Lie on your side with your left / right leg in the top position. 2. Bend your left / right knee and grab your ankle. 3. Slowly bring your knee back so your thigh is behind your body. 4. Slowly  lower your knee toward the floor until you feel a gentle stretch on the outside of your left / right thigh. If you do not feel a stretch and your knee will not fall farther, place the heel of your other foot on top of your outer knee and pull your thigh down farther. 5. Hold this position for __________ seconds. 6. Slowly return to the starting position. Repeat __________ times. Complete this exercise __________ times a day. Strengthening exercises These exercises build strength and endurance in your hip and pelvis. Endurance is the ability to use your muscles for a long time, even after they get tired. Exercise B: Bridge ( hip extensors) 1. Lie on your back on a firm surface with your knees bent and your feet flat on the floor. 2. Tighten your buttocks muscles and lift your buttocks off the floor until your trunk is level with your thighs. You should feel the muscles working in your buttocks and the back of your thighs. If this exercise is too easy, try doing it with your arms crossed over your chest. 3. Hold this position for __________ seconds. 4. Slowly return to the starting position. 5. Let your muscles relax completely between repetitions. Repeat __________ times. Complete this exercise __________ times a day. Exercise C: Squats ( knee extensors and  quadriceps) 1. Stand in front of a table, with your feet and knees pointing straight ahead. You may rest your hands on the table for balance but not for support. 2. Slowly bend your knees and lower your hips like you are going to sit in a chair. ? Keep your weight over  your heels, not over your toes. ? Keep your lower legs upright so they are parallel with the table legs. ? Do not let your hips go lower than your knees. ? Do not bend lower than told by your health care provider. ? If your hip pain increases, do not bend as low. 3. Hold this position for __________ seconds. 4. Slowly push with your legs to return to standing. Do not use your hands to pull yourself to standing. Repeat __________ times. Complete this exercise __________ times a day. Exercise D: Hip hike 1. Stand sideways on a bottom step. Stand on your left / right leg with your other foot unsupported next to the step. You can hold onto the railing or wall if needed for balance. 2. Keeping your knees straight and your torso square, lift your left / right hip up toward the ceiling. 3. Hold this position for __________ seconds. 4. Slowly let your left / right hip lower toward the floor, past the starting position. Your foot should get closer to the floor. Do not lean or bend your knees. Repeat __________ times. Complete this exercise __________ times a day. Exercise E: Single leg stand 1. Stand near a counter or door frame that you can hold onto for balance as needed. It is helpful to stand in front of a mirror for this exercise so you can watch your hip. 2. Squeeze your left / right buttock muscles then lift up your other foot. Do not let your left / right hip push out to the side. 3. Hold this position for __________ seconds. Repeat __________ times. Complete this exercise __________ times a day. This information is not intended to replace advice given to you by your health care provider. Make sure you discuss any questions you have with your health care provider. Document Released: 03/21/2004 Document Revised: 10/19/2015 Document Reviewed: 01/27/2015 Elsevier  Interactive Patient Education  [employer].

## 2017-10-13 ENCOUNTER — Ambulatory Visit (INDEPENDENT_AMBULATORY_CARE_PROVIDER_SITE_OTHER): Payer: 59 | Admitting: Family Medicine

## 2017-10-13 ENCOUNTER — Encounter: Payer: Self-pay | Admitting: Family Medicine

## 2017-10-13 VITALS — BP 122/80 | HR 81 | Temp 98.4°F | Resp 12 | Ht 62.0 in | Wt 121.1 lb

## 2017-10-13 DIAGNOSIS — N390 Urinary tract infection, site not specified: Secondary | ICD-10-CM

## 2017-10-13 DIAGNOSIS — R300 Dysuria: Secondary | ICD-10-CM

## 2017-10-13 LAB — POCT URINALYSIS DIPSTICK
Bilirubin, UA: NEGATIVE
Glucose, UA: NEGATIVE
KETONES UA: NEGATIVE
Nitrite, UA: NEGATIVE
Protein, UA: POSITIVE — AB
Urobilinogen, UA: 0.2 E.U./dL
pH, UA: 6 (ref 5.0–8.0)

## 2017-10-13 MED ORDER — CIPROFLOXACIN HCL 500 MG PO TABS: 500.0000 mg | ORAL_TABLET | Freq: Two times a day (BID) | ORAL | 0 refills | Status: AC

## 2017-10-13 NOTE — Progress Notes (Signed)
HPI:  Chief Complaint  Patient presents with  . Dysuria    sx started this morning  . Urinary Frequency    [name] is a 52 y.o. female, who is here today complaining of dysuria that is started this morning, "slight frequency." She has history of UTI, last one we treated it in 03/2017. So far she has not had urgency but she states that usually her UTIs started with dysuria and progressed rapidly.  Negative for gross hematuria, vaginal discharge, or vaginal bleeding.  No abdominal pain, nausea, or vomiting.  She was on the beach this past weekend, she denies vaginal pruritus.   LMP: Postmenopausal. Sexual activity: Yes Hx of UTI: 04/08/17 UCx grew E. coli.  She was treated with Macrobid 100 mg twice daily for 7 days.  She has not tried OTC medications.  She is planning on leaving town in 2 to 3 days.   Review of Systems  Constitutional: Negative for activity change, appetite change, fatigue and fever.  Gastrointestinal: Negative for abdominal pain, nausea and vomiting.  Genitourinary: Positive for dysuria and frequency. Negative for decreased urine volume, hematuria, urgency, vaginal bleeding and vaginal discharge.  Musculoskeletal: Negative for back pain and myalgias.      Current Outpatient Medications on File Prior to Visit  Medication Sig Dispense Refill  . Adalimumab (HUMIRA PEN) 40 MG/0.4ML PNKT Inject 40 mg into the skin every 14 (fourteen) days. 3 each 0  . amLODipine (NORVASC) 5 MG tablet Take 1 tablet (5 mg total) by mouth daily. 90 tablet 3  . diclofenac sodium (VOLTAREN) 1 % GEL Apply 3 g to 3 large joints up to 3 times daily. 3 Tube 3  . folic acid (FOLVITE) 1 MG tablet Take 1 mg by mouth daily.     . methotrexate (RHEUMATREX) 2.5 MG tablet TAKE 8 TABLETS BY MOUTH,  ONCE WEEKLY. CAUTION:  CHEMOTHERAPY. PROTECT FROM  LIGHT. 96 tablet 0   Current Facility-Administered Medications on File Prior to Visit  Medication Dose Route Frequency Provider  Last Rate Last Dose  . 0.9 %  sodium chloride infusion  500 mL Intravenous Once Pyrtle, Carie Caddy, MD         Past Medical History:  Diagnosis Date  . Arthritis    RA Dx at age 62  . Bell's palsy   . Chicken pox   . Hypertension   . PVC (premature ventricular contraction) 01/02/2017  . Snoring 01/02/2017   No Known Allergies  Social History   Socioeconomic History  . Marital status: Married    Spouse name: Not on file  . Number of children: Not on file  . Years of education: Not on file  . Highest education level: Not on file  Occupational History  . Not on file  Social Needs  . Financial resource strain: Not on file  . Food insecurity:    Worry: Not on file    Inability: Not on file  . Transportation needs:    Medical: Not on file    Non-medical: Not on file  Tobacco Use  . Smoking status: Never Smoker  . Smokeless tobacco: Never Used  Substance and Sexual Activity  . Alcohol use: No    Frequency: Never  . Drug use: No  . Sexual activity: Not on file  Lifestyle  . Physical activity:    Days per week: Not on file    Minutes per session: Not on file  . Stress: Not on file  Relationships  . Social connections:    Talks on phone: Not on file    Gets together: Not on file    Attends religious service: Not on file    Active member of club or organization: Not on file    Attends meetings of clubs or organizations: Not on file    Relationship status: Not on file  Other Topics Concern  . Not on file  Social History Narrative  . Not on file    Vitals:   10/13/17 0853  BP: 122/80  Pulse: 81  Resp: 12  Temp: 98.4 F (36.9 C)  SpO2: 100%   Body mass index is 22.15 kg/m.   Physical Exam  Nursing note and vitals reviewed. Constitutional: She is oriented to person, place, and time. She appears well-developed and well-nourished. No distress.  HENT:  Head: Normocephalic and atraumatic.  Mouth/Throat: Oropharynx is clear and moist and mucous membranes are  normal.  Eyes: Conjunctivae are normal.  Cardiovascular: Normal rate and regular rhythm.  No murmur heard. Respiratory: Effort normal and breath sounds normal. No respiratory distress.  GI: Soft. She exhibits no mass. There is no tenderness. There is no CVA tenderness.  Musculoskeletal: She exhibits no edema.  Neurological: She is alert and oriented to person, place, and time.  Skin: Skin is warm. No erythema.  Psychiatric: Her speech is normal. Her mood appears anxious.      ASSESSMENT AND PLAN:   [name] was seen today for dysuria and urinary frequency.  Diagnoses and all orders for this visit:  Dysuria -     POC Urinalysis Dipstick -     Urine culture  Urinary tract infection without hematuria, site unspecified -     Urine culture -     ciprofloxacin (CIPRO) 500 MG tablet; Take 1 tablet (500 mg total) by mouth 2 (two) times daily for 3 days.  Possible other causes of reported symptoms discussed. Urine dipstick was not back at the time visit was completed. Because history of recurrent UTIs and given the fact she is planning on leaving town in a couple days, we decided to start empiric treatment. Ucx ordered.   We discussed side effects of Cipro, treatment will be tailored according to Ucx results and susceptibility report.  Clearly instructed about warning signs. F/U if symptoms persist.      Betty G. [place], MD  [facility]. [place] office.

## 2017-10-13 NOTE — Patient Instructions (Signed)
A few things to remember from today's visit:   Frequency of urination - Plan: POC Urinalysis Dipstick  Dysuria - Plan: POC Urinalysis Dipstick    Adequate fluid intake, avoid holding urine for long hours, and over the counter Vit C OR cranberry capsules might help.  Today we will treat empirically with antibiotic, Cipro this time. We might need to change when urine culture comes back depending of bacteria susceptibility.  Seek immediate medical attention if severe abdominal pain, vomiting, fever/chills, or worsening symptoms. F/U if symptomatic are not any better after 2-3 days of antibiotic treatment.

## 2017-10-14 LAB — URINE CULTURE
MICRO NUMBER:: 90984442
SPECIMEN QUALITY:: ADEQUATE

## 2017-10-30 NOTE — Progress Notes (Signed)
HPI:   Vickie [name] is a 52 y.o. female, who is here today for her routine physical.  Last CPE: 11/2016 gyn examination.  Regular exercise 3 or more time per week: None. Following a healthy diet: Yes She lives with her mother,husband. And son.  Chronic medical problems: HTN,PVC's,OSA,and RA among some.  Pap smear on 12/06/16, negative. Hx of abnormal pap smears: Neg Hx of STD's Neg  Immunization History  Administered Date(s) Administered  . Influenza,inj,Quad PF,6+ Mos 12/05/2015  . Tdap 09/26/2014    Mammogram: Mammogram 12/2016 Bi-rads 1. Colonoscopy: 03/2017  She was referred to dermatologist by her rheumatologist, a mole was removed but otherwise skin examination was normal.  Hypertension:   Currently on amlodipine 5 mg daily. She is not checking BP periodically but it is checked at her rheumatologist's office.  She is taking medications as instructed, no side effects reported.  She has not noted unusual headache, visual changes, exertional chest pain, dyspnea,  focal weakness, or edema.   Lab Results  Component Value Date   CREATININE 0.57 08/14/2017   BUN 16 08/14/2017   NA 140 08/14/2017   K 4.3 08/14/2017   CL 105 08/14/2017   CO2 28 08/14/2017   She has no concerns today.  Review of Systems  Constitutional: Negative for appetite change, fatigue and fever.  HENT: Negative for dental problem, hearing loss, mouth sores, sore throat, trouble swallowing and voice change.   Eyes: Negative for redness and visual disturbance.  Respiratory: Negative for cough, shortness of breath and wheezing.   Cardiovascular: Negative for chest pain and leg swelling.  Gastrointestinal: Negative for abdominal pain, nausea and vomiting.       No changes in bowel habits.  Endocrine: Negative for cold intolerance, heat intolerance, polydipsia, polyphagia and polyuria.  Genitourinary: Negative for decreased urine volume, dysuria, hematuria, vaginal bleeding and  vaginal discharge.  Musculoskeletal: Negative for gait problem and myalgias.  Skin: Negative for color change and rash.  Allergic/Immunologic: Negative for environmental allergies.  Neurological: Negative for syncope, weakness and headaches.  Hematological: Negative for adenopathy. Does not bruise/bleed easily.  Psychiatric/Behavioral: Negative for confusion and sleep disturbance. The patient is not nervous/anxious.   All other systems reviewed and are negative.     Current Outpatient Medications on File Prior to Visit  Medication Sig Dispense Refill  . Adalimumab (HUMIRA PEN) 40 MG/0.4ML PNKT Inject 40 mg into the skin every 14 (fourteen) days. 3 each 0  . diclofenac sodium (VOLTAREN) 1 % GEL Apply 3 g to 3 large joints up to 3 times daily. 3 Tube 3  . folic acid (FOLVITE) 1 MG tablet Take 1 mg by mouth daily.     . methotrexate (RHEUMATREX) 2.5 MG tablet TAKE 8 TABLETS BY MOUTH,  ONCE WEEKLY. CAUTION:  CHEMOTHERAPY. PROTECT FROM  LIGHT. 96 tablet 0   Current Facility-Administered Medications on File Prior to Visit  Medication Dose Route Frequency Provider Last Rate Last Dose  . 0.9 %  sodium chloride infusion  500 mL Intravenous Once Pyrtle, Carie Caddy, MD         Past Medical History:  Diagnosis Date  . Arthritis    RA Dx at age 65  . Bell's palsy   . Chicken pox   . Hypertension   . PVC (premature ventricular contraction) 01/02/2017  . Snoring 01/02/2017    Past Surgical History:  Procedure Laterality Date  . CESAREAN SECTION    . FOOT SURGERY  No Known Allergies  Family History  Problem Relation Age of Onset  . Rheum arthritis Mother   . Diabetes Neg Hx   . Hypertension Neg Hx   . Hyperlipidemia Neg Hx   . Stroke Neg Hx   . Colon cancer Neg Hx     Social History   Socioeconomic History  . Marital status: Married    Spouse name: Not on file  . Number of children: Not on file  . Years of education: Not on file  . Highest education level: Not on file    Occupational History  . Not on file  Social Needs  . Financial resource strain: Not on file  . Food insecurity:    Worry: Not on file    Inability: Not on file  . Transportation needs:    Medical: Not on file    Non-medical: Not on file  Tobacco Use  . Smoking status: Never Smoker  . Smokeless tobacco: Never Used  Substance and Sexual Activity  . Alcohol use: No    Frequency: Never  . Drug use: No  . Sexual activity: Not on file  Lifestyle  . Physical activity:    Days per week: Not on file    Minutes per session: Not on file  . Stress: Not on file  Relationships  . Social connections:    Talks on phone: Not on file    Gets together: Not on file    Attends religious service: Not on file    Active member of club or organization: Not on file    Attends meetings of clubs or organizations: Not on file    Relationship status: Not on file  Other Topics Concern  . Not on file  Social History Narrative  . Not on file     Vitals:   10/31/17 0848  BP: 110/78  Pulse: 80  Resp: 12  Temp: 98 F (36.7 C)  SpO2: 100%   Body mass index is 22.38 kg/m.   Wt Readings from Last 3 Encounters:  10/31/17 122 lb 6 oz (55.5 kg)  10/13/17 121 lb 2 oz (54.9 kg)  09/18/17 123 lb (55.8 kg)    Physical Exam  Nursing note and vitals reviewed. Constitutional: She is oriented to person, place, and time. She appears well-developed and well-nourished. No distress.  HENT:  Head: Normocephalic and atraumatic.  Right Ear: Hearing, tympanic membrane, external ear and ear canal normal.  Left Ear: Hearing, tympanic membrane, external ear and ear canal normal.  Mouth/Throat: Uvula is midline, oropharynx is clear and moist and mucous membranes are normal.  Eyes: Pupils are equal, round, and reactive to light. Conjunctivae and EOM are normal.  Neck: No tracheal deviation present. No thyromegaly present.  Cardiovascular: Normal rate and regular rhythm.  No murmur heard. Pulses:       Dorsalis pedis pulses are 2+ on the right side, and 2+ on the left side.  Respiratory: Effort normal and breath sounds normal. No respiratory distress.  GI: Soft. She exhibits no mass. There is no hepatomegaly. There is no tenderness.  Genitourinary:  Genitourinary Comments: Breast: Nodularity, mainly on upper outer quadrants. No nipple discharge or skin changes.  Musculoskeletal: She exhibits no edema.  No major deformity or signs of synovitis appreciated.  Lymphadenopathy:    She has no cervical adenopathy.       Right: No supraclavicular adenopathy present.       Left: No supraclavicular adenopathy present.  Neurological: She is alert and oriented to  person, place, and time. She has normal strength. No cranial nerve deficit. Coordination and gait normal.  Reflex Scores:      Bicep reflexes are 2+ on the right side and 2+ on the left side.      Patellar reflexes are 2+ on the right side and 2+ on the left side. Skin: Skin is warm. No rash noted. No erythema.  Psychiatric: She has a normal mood and affect. Her speech is normal.  Well groomed, good eye contact.      ASSESSMENT AND PLAN:  [name] was here today annual physical examination.   Orders Placed This Encounter  Procedures  . Basic metabolic panel  . Hemoglobin A1c  . Lipid panel    [place] was seen today for annual exam.  Orders Placed This Encounter  Procedures  . Basic metabolic panel  . Hemoglobin A1c  . Lipid panel   Lab Results  Component Value Date   CREATININE 0.56 10/31/2017   BUN 18 10/31/2017   NA 140 10/31/2017   K 4.0 10/31/2017   CL 104 10/31/2017   CO2 32 10/31/2017   Lab Results  Component Value Date   HGBA1C 5.5 10/31/2017   Lab Results  Component Value Date   CHOL 189 10/31/2017   HDL 67.40 10/31/2017   LDLCALC 90 10/31/2017   TRIG 157.0 (H) 10/31/2017   CHOLHDL 3 10/31/2017    Routine general medical examination at a health care facility  We discussed the importance  of regular physical activity and healthy diet for prevention of chronic illness and/or complications. Preventive guidelines reviewed. Vaccination up to date. Pap smear due in 2021. She will arrange mammogram.  Ca++ and vit D supplementation recommended. Next CPE in a year.   Screening for lipid disorders -     Lipid panel  Diabetes mellitus screening -     Basic metabolic panel -     Hemoglobin A1c   Hypertension, essential Adequately controlled. No changes in current management, amlodipine 5 mg daily. Continue low-salt/DASH diet. Eye exam was done yesterday. Continue monitoring BP. I think is appropriate to follow annually, before if needed.    Rheumatoid arthritis (HCC) Continue following with rheumatologist, who she sees every 3 to 4 months.     Return in 1 year (on 11/01/2018) for cpe.          Betty G. [place], MD  [facility]. [place] office.

## 2017-10-31 ENCOUNTER — Ambulatory Visit (INDEPENDENT_AMBULATORY_CARE_PROVIDER_SITE_OTHER): Payer: 59 | Admitting: Family Medicine

## 2017-10-31 ENCOUNTER — Encounter: Payer: Self-pay | Admitting: Family Medicine

## 2017-10-31 VITALS — BP 110/78 | HR 80 | Temp 98.0°F | Resp 12 | Ht 62.0 in | Wt 122.4 lb

## 2017-10-31 DIAGNOSIS — Z131 Encounter for screening for diabetes mellitus: Secondary | ICD-10-CM | POA: Diagnosis not present

## 2017-10-31 DIAGNOSIS — Z0000 Encounter for general adult medical examination without abnormal findings: Secondary | ICD-10-CM

## 2017-10-31 DIAGNOSIS — Z13220 Encounter for screening for lipoid disorders: Secondary | ICD-10-CM | POA: Diagnosis not present

## 2017-10-31 DIAGNOSIS — I10 Essential (primary) hypertension: Secondary | ICD-10-CM | POA: Diagnosis not present

## 2017-10-31 DIAGNOSIS — M0579 Rheumatoid arthritis with rheumatoid factor of multiple sites without organ or systems involvement: Secondary | ICD-10-CM

## 2017-10-31 LAB — LIPID PANEL
CHOL/HDL RATIO: 3
CHOLESTEROL: 189 mg/dL (ref 0–200)
HDL: 67.4 mg/dL (ref >39.00)
LDL Cholesterol: 90 mg/dL (ref 0–99)
NonHDL: 121.14
TRIGLYCERIDES: 157 mg/dL — AB (ref 0.0–149.0)
VLDL: 31.4 mg/dL (ref 0.0–40.0)

## 2017-10-31 LAB — BASIC METABOLIC PANEL
BUN: 18 mg/dL (ref 6–23)
CO2: 32 mEq/L (ref 19–32)
Calcium: 9.5 mg/dL (ref 8.4–10.5)
Chloride: 104 mEq/L (ref 96–112)
Creatinine, Ser: 0.56 mg/dL (ref 0.40–1.20)
GFR: 120.34 mL/min (ref >60.00)
GLUCOSE: 89 mg/dL (ref 70–99)
POTASSIUM: 4 meq/L (ref 3.5–5.1)
SODIUM: 140 meq/L (ref 135–145)

## 2017-10-31 LAB — HEMOGLOBIN A1C: Hgb A1c MFr Bld: 5.5 % (ref 4.6–6.5)

## 2017-10-31 MED ORDER — AMLODIPINE BESYLATE 5 MG PO TABS: 5.0000 mg | ORAL_TABLET | Freq: Every day | ORAL | 3 refills | Status: DC

## 2017-10-31 NOTE — Assessment & Plan Note (Signed)
Adequately controlled. No changes in current management, amlodipine 5 mg daily. Continue low-salt/DASH diet. Eye exam was done yesterday. Continue monitoring BP. I think is appropriate to follow annually, before if needed.

## 2017-10-31 NOTE — Patient Instructions (Addendum)
A few things to remember from today's visit:   Routine general medical examination at a health care facility  Hypertension, essential - Plan: amLODipine (NORVASC) 5 MG tablet  Screening for lipid disorders - Plan: Lipid panel  Diabetes mellitus screening - Plan: Basic metabolic panel, Hemoglobin A1c Today you have you routine preventive visit.  At least 150 minutes of moderate exercise per week, daily brisk walking for 15-30 min is a good exercise option. Healthy diet low in saturated (animal) fats and sweets and consisting of fresh fruits and vegetables, lean meats such as fish and white chicken and whole grains.  These are some of recommendations for screening depending of age and risk factors:   - Vaccines:  Tdap vaccine every 10 years.  Shingles vaccine recommended at age 8, could be given after 53 years of age but not sure about insurance coverage.   Pneumonia vaccines:  Prevnar 13 at 65 and Pneumovax at 66. Sometimes Pneumovax is giving earlier if history of smoking, lung disease,diabetes,kidney disease among some.    Screening for diabetes at age 62 and every 3 years.  Cervical cancer prevention:  Pap smear starts at 53 years of age and continues periodically until 53 years old in low risk women. Pap smear every 3 years between 33 and 56 years old. Pap smear every 3-5 years between women 30 and older if pap smear negative and HPV screening negative.   -Breast cancer: Mammogram: There is disagreement between experts about when to start screening in low risk asymptomatic female but recent recommendations are to start screening at 84 and not later than 53 years old , every 1-2 years and after 53 yo q 2 years. Screening is recommended until 53 years old but some women can continue screening depending of healthy issues.   Colon cancer screening: starts at 53 years old until 53 years old.  Cholesterol disorder screening at age 92 and every 3 years.  Also  recommended:  1. Dental visit- Brush and floss your teeth twice daily; visit your dentist twice a year. 2. Eye doctor- Get an eye exam at least every 2 years. 3. Helmet use- Always wear a helmet when riding a bicycle, motorcycle, rollerblading or skateboarding. 4. Safe sex- If you may be exposed to sexually transmitted infections, use a condom. 5. Seat belts- Seat belts can save your live; always wear one. 6. Smoke/Carbon Monoxide detectors- These detectors need to be installed on the appropriate level of your home. Replace batteries at least once a year. 7. Skin cancer- When out in the sun please cover up and use sunscreen 15 SPF or higher. 8. Violence- If anyone is threatening or hurting you, please tell your healthcare provider.  9. Drink alcohol in moderation- Limit alcohol intake to one drink or less per day. Never drink and drive.   Please be sure medication list is accurate. If a new problem present, please set up appointment sooner than planned today.

## 2017-10-31 NOTE — Assessment & Plan Note (Signed)
Continue following with rheumatologist, who she sees every 3 to 4 months.

## 2017-11-01 ENCOUNTER — Other Ambulatory Visit: Payer: Self-pay | Admitting: Rheumatology

## 2017-11-02 ENCOUNTER — Encounter: Payer: Self-pay | Admitting: Family Medicine

## 2017-11-03 NOTE — Telephone Encounter (Addendum)
Last Visit: 09/18/17 Next visit: 02/09/18 Labs: 08/14/17 WNL TB Gold: 10/29/16 Neg   Patient advised she is due for labs and will update this week.  Okay to refill 30 day supply per Dr. Corliss Skains

## 2017-11-21 ENCOUNTER — Other Ambulatory Visit: Payer: Self-pay | Admitting: *Deleted

## 2017-11-21 DIAGNOSIS — Z79899 Other long term (current) drug therapy: Secondary | ICD-10-CM

## 2017-11-21 LAB — CBC WITH DIFFERENTIAL/PLATELET
BASOS ABS: 22 {cells}/uL (ref 0–200)
Basophils Relative: 0.4 %
EOS PCT: 0.9 %
Eosinophils Absolute: 49 cells/uL (ref 15–500)
HEMATOCRIT: 41.6 % (ref 35.0–45.0)
Hemoglobin: 14.1 g/dL (ref 11.7–15.5)
LYMPHS ABS: 1285 {cells}/uL (ref 850–3900)
MCH: 31.1 pg (ref 27.0–33.0)
MCHC: 33.9 g/dL (ref 32.0–36.0)
MCV: 91.8 fL (ref 80.0–100.0)
MPV: 10.9 fL (ref 7.5–12.5)
Monocytes Relative: 7.4 %
NEUTROS PCT: 67.5 %
Neutro Abs: 3645 cells/uL (ref 1500–7800)
PLATELETS: 287 10*3/uL (ref 140–400)
RBC: 4.53 10*6/uL (ref 3.80–5.10)
RDW: 13.1 % (ref 11.0–15.0)
TOTAL LYMPHOCYTE: 23.8 %
WBC mixed population: 400 cells/uL (ref 200–950)
WBC: 5.4 10*3/uL (ref 3.8–10.8)

## 2017-11-21 LAB — COMPLETE METABOLIC PANEL WITH GFR
AG RATIO: 1.5 (calc) (ref 1.0–2.5)
ALKALINE PHOSPHATASE (APISO): 80 U/L (ref 33–130)
ALT: 17 U/L (ref 6–29)
AST: 26 U/L (ref 10–35)
Albumin: 4.4 g/dL (ref 3.6–5.1)
BILIRUBIN TOTAL: 0.9 mg/dL (ref 0.2–1.2)
BUN: 17 mg/dL (ref 7–25)
CALCIUM: 9.7 mg/dL (ref 8.6–10.4)
CHLORIDE: 102 mmol/L (ref 98–110)
CO2: 28 mmol/L (ref 20–32)
Creat: 0.65 mg/dL (ref 0.50–1.05)
GFR, EST AFRICAN AMERICAN: 117 mL/min/{1.73_m2} (ref >=60)
GFR, EST NON AFRICAN AMERICAN: 101 mL/min/{1.73_m2} (ref >=60)
GLOBULIN: 3 g/dL (ref 1.9–3.7)
Glucose, Bld: 125 mg/dL — ABNORMAL HIGH (ref 65–99)
Potassium: 4.1 mmol/L (ref 3.5–5.3)
SODIUM: 138 mmol/L (ref 135–146)
Total Protein: 7.4 g/dL (ref 6.1–8.1)

## 2017-11-23 ENCOUNTER — Other Ambulatory Visit: Payer: Self-pay | Admitting: Rheumatology

## 2017-11-24 NOTE — Telephone Encounter (Signed)
Last Visit: 09/18/17 Next visit: 02/09/18 Labs: 11/21/17 Glucose is 125. All other labs are WNL. TB Gold: 10/29/16 Neg   Patient will update TB gold this week.   Okay to refill per Dr. Corliss Skains

## 2017-12-04 ENCOUNTER — Other Ambulatory Visit: Payer: Self-pay

## 2017-12-04 DIAGNOSIS — Z79899 Other long term (current) drug therapy: Secondary | ICD-10-CM

## 2017-12-06 LAB — QUANTIFERON-TB GOLD PLUS
NIL: 0.02 [IU]/mL
QuantiFERON-TB Gold Plus: NEGATIVE
TB1-NIL: 0 [IU]/mL
TB2-NIL: 0 IU/mL

## 2017-12-08 NOTE — Progress Notes (Signed)
TB gold negative

## 2017-12-23 ENCOUNTER — Other Ambulatory Visit: Payer: Self-pay | Admitting: Rheumatology

## 2017-12-23 NOTE — Telephone Encounter (Signed)
Last Visit: 09/18/17 Next visit: 02/09/18 Labs: 11/21/17 Glucose is 125. All other labs are WNL. TB Gold: 12/04/17 Neg   Okay to refill per Dr. Corliss Skains

## 2018-01-07 ENCOUNTER — Other Ambulatory Visit: Payer: Self-pay | Admitting: Family Medicine

## 2018-01-07 ENCOUNTER — Ambulatory Visit
Admission: RE | Admit: 2018-01-07 | Discharge: 2018-01-07 | Disposition: A | Discharge status: Home / Self Care | Payer: 59 | Source: Ambulatory Visit

## 2018-01-07 DIAGNOSIS — Z1231 Encounter for screening mammogram for malignant neoplasm of breast: Secondary | ICD-10-CM

## 2018-01-09 ENCOUNTER — Other Ambulatory Visit: Payer: Self-pay | Admitting: Family Medicine

## 2018-01-09 DIAGNOSIS — R928 Other abnormal and inconclusive findings on diagnostic imaging of breast: Secondary | ICD-10-CM

## 2018-01-14 ENCOUNTER — Ambulatory Visit
Admission: RE | Admit: 2018-01-14 | Discharge: 2018-01-14 | Disposition: A | Discharge status: Home / Self Care | Payer: 59 | Source: Ambulatory Visit | Attending: Family Medicine | Admitting: Family Medicine

## 2018-01-14 ENCOUNTER — Ambulatory Visit: Payer: 59

## 2018-01-14 DIAGNOSIS — R928 Other abnormal and inconclusive findings on diagnostic imaging of breast: Secondary | ICD-10-CM

## 2018-01-26 NOTE — Progress Notes (Signed)
Office Visit Note  Patient: Vickie [name]             Date of Birth: 1965-09-23           MRN: [id number]             PCP: Martinique, Betty G, MD Referring: Martinique, Betty G, MD Visit Date: 02/09/2018 Occupation: '@GUAROCC' @  Subjective:  Recent flares.   History of Present Illness: Vickie [name] is a 52 y.o. female with history of seropositive rheumatoid arthritis.  Cording to patient however she has had several flares recently involving her hands mostly over MCPs and PIP joints.  Also had flares in her knee joints and her ankle joints.  She has had some discomfort in the bottom of her feet.  She has not missed any dosing.  Today she complains of pain in her right first MCP joint.  Activities of Daily Living:  Patient reports morning stiffness for 1 hour.   Patient Reports nocturnal pain.  Difficulty dressing/grooming: Denies Difficulty climbing stairs: Denies Difficulty getting out of chair: Denies Difficulty using hands for taps, buttons, cutlery, and/or writing: Reports  Review of Systems  Constitutional: Negative for fatigue.  HENT: Negative for mouth sores, trouble swallowing, trouble swallowing and mouth dryness.   Eyes: Negative for pain, redness, itching and dryness.  Respiratory: Negative for shortness of breath, wheezing and difficulty breathing.   Cardiovascular: Negative for chest pain, palpitations and swelling in legs/feet.  Gastrointestinal: Negative for abdominal pain, blood in stool, constipation, diarrhea, nausea and vomiting.  Endocrine: Negative for increased urination.  Genitourinary: Negative for painful urination, nocturia and pelvic pain.  Musculoskeletal: Positive for arthralgias, joint pain and morning stiffness. Negative for joint swelling.  Skin: Negative for rash and hair loss.  Allergic/Immunologic: Negative for susceptible to infections.  Neurological: Negative for dizziness, light-headedness, headaches, memory loss and weakness.  Hematological:  Negative for bruising/bleeding tendency.  Psychiatric/Behavioral: Negative for confusion. The patient is not nervous/anxious.     PMFS History:  Patient Active Problem List   Diagnosis Date Noted  . PVC (premature ventricular contraction) 01/02/2017  . Snoring 01/02/2017  . High risk medication use 10/24/2016  . Rheumatoid nodule, left elbow ([place]) 10/24/2016  . Rheumatoid nodule, right elbow ([place]) 10/24/2016  . Calcaneal spur of both feet 10/24/2016  . Dry mouth 10/24/2016  . Rheumatoid arthritis ([place]) 08/07/2016  . Hypertension, essential 08/07/2016    Past Medical History:  Diagnosis Date  . Arthritis    RA Dx at age 64  . Bell's palsy   . Chicken pox   . Hypertension   . PVC (premature ventricular contraction) 01/02/2017  . Snoring 01/02/2017    Family History  Problem Relation Age of Onset  . Rheum arthritis Mother   . Diabetes Neg Hx   . Hypertension Neg Hx   . Hyperlipidemia Neg Hx   . Stroke Neg Hx   . Colon cancer Neg Hx    Past Surgical History:  Procedure Laterality Date  . CESAREAN SECTION    . FOOT SURGERY     Social History   Social History Narrative  . Not on file   Immunization History  Administered Date(s) Administered  . Influenza Inj Mdck Quad Pf 11/28/2016  . Influenza,inj,Quad PF,6+ Mos 12/05/2015, 12/19/2017  . Tdap 09/26/2014    Objective: Vital Signs: BP 110/74 (BP Location: Right Arm, Patient Position: Sitting, Cuff Size: Normal)   Pulse 82   Resp 13   Ht '5\' 2"'  (1.575 m)  Wt 128 lb (58.1 kg)   BMI 23.41 kg/m    Physical Exam Vitals signs and nursing note reviewed.  Constitutional:      Appearance: She is well-developed.  HENT:     Head: Normocephalic and atraumatic.  Eyes:     Conjunctiva/sclera: Conjunctivae normal.  Neck:     Musculoskeletal: Normal range of motion.  Cardiovascular:     Rate and Rhythm: Normal rate and regular rhythm.     Heart sounds: Normal heart sounds.  Pulmonary:     Effort: Pulmonary effort is  normal.     Breath sounds: Normal breath sounds.  Abdominal:     General: Bowel sounds are normal.     Palpations: Abdomen is soft.  Lymphadenopathy:     Cervical: No cervical adenopathy.  Skin:    General: Skin is warm and dry.     Capillary Refill: Capillary refill takes less than 2 seconds.  Neurological:     Mental Status: She is alert and oriented to person, place, and time.  Psychiatric:        Behavior: Behavior normal.      Musculoskeletal Exam: Spine thoracic lumbar spine good range of motion.  Shoulder joints and elbow joints with good range of motion.  She has limited range of motion of bilateral wrist joints.  Synovial thickening was noted over bilateral MCP joints.  Synovitis was noted in some of the joints as described below.  Hip joints and knee joints in good range of motion.  She has some warmth and discomfort with range of motion of her left knee joint.  She also had tenderness over left ankle joint.  CDAI Exam: CDAI Score: 4.7  Patient Global Assessment: 3 (mm); Provider Global Assessment: 4 (mm) Swollen: 2 ; Tender: 3  Joint Exam      Right  Left  MCP 1  Swollen      MCP 3  Swollen Tender     Knee      Tender  Ankle      Tender     Investigation: No additional findings.  Imaging: Mm Diag Breast Tomo Uni Right  Result Date: 01/14/2018 CLINICAL DATA:  52 year old patient recalled from recent screening mammogram for evaluation of possible asymmetry in the superior right breast seen only in the MLO projection. EXAM: DIGITAL DIAGNOSTIC UNILATERAL RIGHT MAMMOGRAM WITH CAD AND TOMO COMPARISON:  January 07, 2018 and earlier priors. ACR Breast Density Category d: The breast tissue is extremely dense, which lowers the sensitivity of mammography. FINDINGS: Spot compression view of the right breast in the MLO projection shows dispersion of fibroglandular tissue with no persistent asymmetry. There is no mass or distortion. 90 degree lateral view of the right breast is  negative. Mammographic images were processed with CAD. IMPRESSION: No evidence of malignancy in the right breast. RECOMMENDATION: Screening mammogram in one year.(Code:SM-B-01Y) I have discussed the findings and recommendations with the patient. Results were also provided in writing at the conclusion of the visit. If applicable, a reminder letter will be sent to the patient regarding the next appointment. BI-RADS CATEGORY  1: Negative. Electronically Signed   By: Curlene Dolphin M.D.   On: 01/14/2018 12:54    Recent Labs: Lab Results  Component Value Date   WBC 5.4 11/21/2017   HGB 14.1 11/21/2017   PLT 287 11/21/2017   NA 138 11/21/2017   K 4.1 11/21/2017   CL 102 11/21/2017   CO2 28 11/21/2017   GLUCOSE 125 (H) 11/21/2017  BUN 17 11/21/2017   CREATININE 0.65 11/21/2017   BILITOT 0.9 11/21/2017   ALKPHOS 79 01/02/2017   AST 26 11/21/2017   ALT 17 11/21/2017   PROT 7.4 11/21/2017   ALBUMIN 4.3 01/02/2017   CALCIUM 9.7 11/21/2017   GFRAA 117 11/21/2017   QFTBGOLDPLUS NEGATIVE 12/04/2017    Speciality Comments: No specialty comments available.  Procedures:  No procedures performed Allergies: Patient has no known allergies.   Assessment / Plan:     Visit Diagnoses: Rheumatoid arthritis involving multiple sites with positive rheumatoid factor (HCC) - Positive RF, positive anti-CCP, elevated ESR, erosive disease with nodulosis: -Patient has been having frequent flares.  She states she has had more discomfort in her hands left knee joint and her ankles.  I do see some synovitis on examination today.  We had detailed discussion regarding different treatment options.  She wants to continue with Humira.  She is taking oral methotrexate.  I would like to switch her to subcu methotrexate.  She was in agreement.  Plan: Sedimentation rate, Rheumatoid factor, Cyclic citrul peptide antibody, IgG  High risk medication use -Current regimen includes Humira 40 mg every 14 days, methotrexate 8 tablets  weekly, folic acid 1 mg daily (increase to 38m?).  Last TB gold negative on 12/04/2017.  Most recent CBC/CMP within normal limits on 11/21/2017.  Patient received flu shot in September.  Recommend pneumonia vaccines and Shingrix vaccine as indicated.  Recommend yearly skin exam.  X-ray of bilateral hands and feet taken on 10/29/2016.  - Plan: CBC with Differential/Platelet, COMPLETE METABOLIC PANEL WITH GFR  Rheumatoid nodule, left elbow (HCC) - Resolved  Rheumatoid nodule, right elbow (HCC) - Resolved  Calcaneal spur of both feet-she does have occasional pain.  Dry mouth  Trochanteric bursitis, left hip  History of hypertension   Orders: Orders Placed This Encounter  Procedures  . CBC with Differential/Platelet  . COMPLETE METABOLIC PANEL WITH GFR  . Sedimentation rate  . Rheumatoid factor  . Cyclic citrul peptide antibody, IgG   Meds ordered this encounter  Medications  . Methotrexate, PF, (RASUVO) 20 MG/0.4ML SOAJ    Sig: Inject 20 mg into the skin once a week.    Dispense:  12 pen    Refill:  0    Face-to-face time spent with patient was 30 minutes. Greater than 50% of time was spent in counseling and coordination of care.  Follow-Up Instructions: Return in about 3 months (around 05/11/2018) for Rheumatoid arthritis.   SBo Merino MD  Note - This record has been created using DEditor, commissioning  Chart creation errors have been sought, but may not always  have been located. Such creation errors do not reflect on  the standard of medical care.

## 2018-01-27 ENCOUNTER — Telehealth: Payer: Self-pay | Admitting: *Deleted

## 2018-01-27 NOTE — Telephone Encounter (Signed)
[facility] sent fax to advise that have made multiple attempts to contact patient to sill Humira. Unable to reach patient. Call back number [phone]

## 2018-01-27 NOTE — Telephone Encounter (Signed)
Spoke with patient to advise patient we received a fax stating that  Vickie [name] has been trying to reach her to fill her Humira. Patient states she has not received a call and will reach out to them today. Patient provided with call back number.

## 2018-02-07 ENCOUNTER — Other Ambulatory Visit: Payer: Self-pay | Admitting: Rheumatology

## 2018-02-09 ENCOUNTER — Ambulatory Visit (INDEPENDENT_AMBULATORY_CARE_PROVIDER_SITE_OTHER): Payer: 59 | Admitting: Rheumatology

## 2018-02-09 ENCOUNTER — Telehealth: Payer: Self-pay | Admitting: Pharmacist

## 2018-02-09 ENCOUNTER — Encounter: Payer: Self-pay | Admitting: Physician Assistant

## 2018-02-09 VITALS — BP 110/74 | HR 82 | Resp 13 | Ht 62.0 in | Wt 128.0 lb

## 2018-02-09 DIAGNOSIS — M0579 Rheumatoid arthritis with rheumatoid factor of multiple sites without organ or systems involvement: Secondary | ICD-10-CM

## 2018-02-09 DIAGNOSIS — M7062 Trochanteric bursitis, left hip: Secondary | ICD-10-CM

## 2018-02-09 DIAGNOSIS — M06321 Rheumatoid nodule, right elbow: Secondary | ICD-10-CM | POA: Diagnosis not present

## 2018-02-09 DIAGNOSIS — Z79899 Other long term (current) drug therapy: Secondary | ICD-10-CM | POA: Diagnosis not present

## 2018-02-09 DIAGNOSIS — R682 Dry mouth, unspecified: Secondary | ICD-10-CM

## 2018-02-09 DIAGNOSIS — Z8679 Personal history of other diseases of the circulatory system: Secondary | ICD-10-CM

## 2018-02-09 DIAGNOSIS — M06322 Rheumatoid nodule, left elbow: Secondary | ICD-10-CM

## 2018-02-09 DIAGNOSIS — M7732 Calcaneal spur, left foot: Secondary | ICD-10-CM

## 2018-02-09 DIAGNOSIS — M7731 Calcaneal spur, right foot: Secondary | ICD-10-CM

## 2018-02-09 MED ORDER — METHOTREXATE (PF) 20 MG/0.4ML ~~LOC~~ SOAJ: 20.0000 mg | SUBCUTANEOUS | 0 refills | Status: DC

## 2018-02-09 NOTE — Patient Instructions (Signed)
Standing Labs We placed an order today for your standing lab work.    Please come back and get your standing labs in March and every 3 months  We have open lab Monday through Friday from 8:30-11:30 AM and 1:30-4:00 PM  at the office of Dr. Travonta Gill.   You may experience shorter wait times on Monday and Friday afternoons. The office is located at [address], [address], [place], [place] [address] No appointment is necessary.   Labs are drawn by [facility].  You may receive a bill from [facility] for your lab work. If you have any questions regarding directions or hours of operation,  please call [phone].   Just as a reminder please drink plenty of water prior to coming for your lab work. Thanks!  

## 2018-02-09 NOTE — Telephone Encounter (Signed)
Last Visit: 09/18/17 Next visit: 02/09/18 Labs:9/27/19Glucose is 125. All other labs are WNL.  Okay to refill per Dr. Corliss Skains

## 2018-02-09 NOTE — Telephone Encounter (Signed)
Ran test claims- Otrexup was not covered, MTX vial and syringe- rejecting for route of administration not covered, Rasuvo- 1 month supply was $52.88. Patient is co-pay card eligible.   Received a Prior Authorization request from [facility] for [employer]. Authorization has been submitted to patient's insurance via Cover My Meds. Will update once we receive a response.  10:40 AM Dorthula Nettles, CPhT

## 2018-02-09 NOTE — Telephone Encounter (Signed)
Please start benefits investigation for subq methotrexate.  She is having an inadequate response to oral.  Thank you!

## 2018-02-10 LAB — CYCLIC CITRUL PEPTIDE ANTIBODY, IGG: Cyclic Citrullin Peptide Ab: >250 UNITS — ABNORMAL HIGH

## 2018-02-10 LAB — COMPLETE METABOLIC PANEL WITH GFR
AG Ratio: 1.1 (calc) (ref 1.0–2.5)
ALT: 11 U/L (ref 6–29)
AST: 20 U/L (ref 10–35)
Albumin: 4.1 g/dL (ref 3.6–5.1)
Alkaline phosphatase (APISO): 86 U/L (ref 33–130)
BUN: 16 mg/dL (ref 7–25)
CALCIUM: 9.7 mg/dL (ref 8.6–10.4)
CO2: 25 mmol/L (ref 20–32)
Chloride: 106 mmol/L (ref 98–110)
Creat: 0.53 mg/dL (ref 0.50–1.05)
GFR, EST NON AFRICAN AMERICAN: 108 mL/min/{1.73_m2} (ref >=60)
GFR, Est African American: 126 mL/min/{1.73_m2} (ref >=60)
GLOBULIN: 3.7 g/dL (ref 1.9–3.7)
Glucose, Bld: 102 mg/dL — ABNORMAL HIGH (ref 65–99)
Potassium: 4.3 mmol/L (ref 3.5–5.3)
Sodium: 139 mmol/L (ref 135–146)
Total Bilirubin: 0.5 mg/dL (ref 0.2–1.2)
Total Protein: 7.8 g/dL (ref 6.1–8.1)

## 2018-02-10 LAB — CBC WITH DIFFERENTIAL/PLATELET
Absolute Monocytes: 329 cells/uL (ref 200–950)
BASOS ABS: 19 {cells}/uL (ref 0–200)
Basophils Relative: 0.4 %
Eosinophils Absolute: 38 cells/uL (ref 15–500)
Eosinophils Relative: 0.8 %
HEMATOCRIT: 43.9 % (ref 35.0–45.0)
Hemoglobin: 14.6 g/dL (ref 11.7–15.5)
LYMPHS ABS: 1330 {cells}/uL (ref 850–3900)
MCH: 30.4 pg (ref 27.0–33.0)
MCHC: 33.3 g/dL (ref 32.0–36.0)
MCV: 91.5 fL (ref 80.0–100.0)
MPV: 10.4 fL (ref 7.5–12.5)
Monocytes Relative: 7 %
NEUTROS PCT: 63.5 %
Neutro Abs: 2985 cells/uL (ref 1500–7800)
PLATELETS: 353 10*3/uL (ref 140–400)
RBC: 4.8 10*6/uL (ref 3.80–5.10)
RDW: 12 % (ref 11.0–15.0)
TOTAL LYMPHOCYTE: 28.3 %
WBC: 4.7 10*3/uL (ref 3.8–10.8)

## 2018-02-10 LAB — RHEUMATOID FACTOR: Rheumatoid fact SerPl-aCnc: >1000 IU/mL — ABNORMAL HIGH (ref <14)

## 2018-02-10 LAB — SEDIMENTATION RATE: Sed Rate: 33 mm/h — ABNORMAL HIGH (ref 0–30)

## 2018-02-10 NOTE — Telephone Encounter (Signed)
Received a fax from [facility] regarding a prior authorization for Rasuvo. Authorization has been APPROVED from 02/10/18 to 02/11/2023.   Will send document to scan center.  Authorization # [id number] Phone # [phone]  3:56 PM Cashton Hosley Johnney Ou, CPhT

## 2018-02-11 NOTE — Progress Notes (Signed)
Her sed rate is a still elevated.  She has high titers of rheumatoid factor and anti-CCP antibodies.  She is currently on Humira and methotrexate.  We discussed switching from Humira to other medications during the last visit.  She wants to wait until the follow-up visit.  Please notify patient of her lab results.

## 2018-02-13 NOTE — Telephone Encounter (Signed)
Received fax for medication clarification for methotrexate from [facility].  Called [phone] to clarify that patient is switching from oral tablets to injectable.  Pharmacist filled order for Rasuvo and discontinued prescription for oral tablets.  Verlin Fester, PharmD, [facility] Rheumatology Clinical Pharmacist  02/13/2018 2:32 PM

## 2018-03-10 ENCOUNTER — Other Ambulatory Visit: Payer: Self-pay | Admitting: Rheumatology

## 2018-03-10 NOTE — Telephone Encounter (Signed)
Last visit: 02/09/18 Next visit: 05/12/18 Labs: 02/09/18  Cbc/cmp wnl TB Gold: 12/04/17 Neg   Okay to refill per Dr. Corliss Skains

## 2018-04-20 ENCOUNTER — Telehealth: Payer: Self-pay | Admitting: *Deleted

## 2018-04-20 NOTE — Progress Notes (Signed)
Office Visit Note  Patient: Vickie [name]             Date of Birth: 11/13/1965           MRN: [id number]             PCP: Martinique, Betty G, MD Referring: Martinique, Betty G, MD Visit Date: 04/21/2018 Occupation: _0 @  Subjective:  Pain in both hands   History of Present Illness: Vickie [name] is a 53 y.o. female with history of seropositive rheumatoid arthritis and trochanteric bursitis. She is on Humira 40 mg sq injections every 14 days, Rasuvo 20 mg sq injections once weekly, and folic acid 2 mg po daily. She has not missed any doses recently.  She presents today with increased pain in both hands and right wrist joint.  She reports mild swelling.  She has been flaring for the past 2 weeks.  She denies any overuse activities. She uses voltaren gel for pain relief.  She denies any other joint pain or joint swelling.  She denies any recent infections.   Activities of Daily Living:  Patient reports morning stiffness for 0 minutes.   Patient Denies nocturnal pain.  Difficulty dressing/grooming: Denies Difficulty climbing stairs: Denies Difficulty getting out of chair: Denies Difficulty using hands for taps, buttons, cutlery, and/or writing: Reports  Review of Systems  Constitutional: Positive for fatigue.  HENT: Negative for mouth sores, mouth dryness and nose dryness.   Eyes: Negative for pain, itching, visual disturbance and dryness.  Respiratory: Negative for cough, hemoptysis, shortness of breath, wheezing and difficulty breathing.   Cardiovascular: Negative for chest pain, palpitations, hypertension and swelling in legs/feet.  Gastrointestinal: Negative for abdominal pain, blood in stool, constipation and diarrhea.  Endocrine: Negative for increased urination.  Genitourinary: Negative for painful urination and pelvic pain.  Musculoskeletal: Positive for arthralgias, joint pain and joint swelling. Negative for myalgias, muscle weakness, morning stiffness, muscle tenderness and  myalgias.  Skin: Negative for color change, pallor, rash, hair loss, nodules/bumps, skin tightness, ulcers and sensitivity to sunlight.  Allergic/Immunologic: Negative for susceptible to infections.  Neurological: Negative for dizziness, light-headedness, numbness, headaches, memory loss and weakness.  Hematological: Negative for bruising/bleeding tendency and swollen glands.  Psychiatric/Behavioral: Negative for depressed mood, confusion and sleep disturbance. The patient is not nervous/anxious.     PMFS History:  Patient Active Problem List   Diagnosis Date Noted  . PVC (premature ventricular contraction) 01/02/2017  . Snoring 01/02/2017  . High risk medication use 10/24/2016  . Rheumatoid nodule, left elbow ([place]) 10/24/2016  . Rheumatoid nodule, right elbow ([place]) 10/24/2016  . Calcaneal spur of both feet 10/24/2016  . Dry mouth 10/24/2016  . Rheumatoid arthritis ([place]) 08/07/2016  . Hypertension, essential 08/07/2016    Past Medical History:  Diagnosis Date  . Arthritis    RA Dx at age 77  . Bell's palsy   . Chicken pox   . Hypertension   . PVC (premature ventricular contraction) 01/02/2017  . Snoring 01/02/2017    Family History  Problem Relation Age of Onset  . Rheum arthritis Mother   . Diabetes Neg Hx   . Hypertension Neg Hx   . Hyperlipidemia Neg Hx   . Stroke Neg Hx   . Colon cancer Neg Hx    Past Surgical History:  Procedure Laterality Date  . CESAREAN SECTION    . FOOT SURGERY     Social History   Social History Narrative  . Not on file   Immunization  History  Administered Date(s) Administered  . Influenza Inj Mdck Quad Pf 11/28/2016  . Influenza,inj,Quad PF,6+ Mos 12/05/2015, 12/19/2017  . Tdap 09/26/2014     Objective: Vital Signs: BP 111/75 (BP Location: Left Arm, Patient Position: Sitting, Cuff Size: Normal)   Pulse 92   Resp 12   Ht '5\' 2"'$  (1.575 m)   Wt 125 lb (56.7 kg)   BMI 22.86 kg/m    Physical Exam Vitals signs and nursing note  reviewed.  Constitutional:      Appearance: She is well-developed.  HENT:     Head: Normocephalic and atraumatic.  Eyes:     Conjunctiva/sclera: Conjunctivae normal.  Neck:     Musculoskeletal: Normal range of motion.  Cardiovascular:     Rate and Rhythm: Normal rate and regular rhythm.     Heart sounds: Normal heart sounds.  Pulmonary:     Effort: Pulmonary effort is normal.     Breath sounds: Normal breath sounds.  Abdominal:     General: Bowel sounds are normal.     Palpations: Abdomen is soft.  Lymphadenopathy:     Cervical: No cervical adenopathy.  Skin:    General: Skin is warm and dry.     Capillary Refill: Capillary refill takes less than 2 seconds.  Neurological:     Mental Status: She is alert and oriented to person, place, and time.  Psychiatric:        Behavior: Behavior normal.      Musculoskeletal Exam: C-spine, thoracic spine, and lumbar spine good ROM.  No midline spinal tenderness.  No SI joint tenderness.  Shoulder joints and elbow joints good ROM with no tenderness or synovitis.  Right wrist limited painful ROM. Synovial thickening of both wrist joints.  Left 5th trigger finger.  Hip joints, knee joints, ankle joints, MTPs, PIPs, and DIPs good ROM with no synovitis.  No warmth or effusion of knee joints.  No tenderness of swelling of ankle joints.   CDAI Exam: CDAI Score: Not documented Patient Global Assessment: 5 (mm); Provider Global Assessment: Not documented Swollen: 0 ; Tender: 0  Joint Exam   Not documented   There is currently no information documented on the homunculus. Go to the Rheumatology activity and complete the homunculus joint exam.  Investigation: No additional findings.  Imaging: No results found.  Recent Labs: Lab Results  Component Value Date   WBC 4.7 02/09/2018   HGB 14.6 02/09/2018   PLT 353 02/09/2018   NA 139 02/09/2018   K 4.3 02/09/2018   CL 106 02/09/2018   CO2 25 02/09/2018   GLUCOSE 102 (H) 02/09/2018   BUN  16 02/09/2018   CREATININE 0.53 02/09/2018   BILITOT 0.5 02/09/2018   ALKPHOS 79 01/02/2017   AST 20 02/09/2018   ALT 11 02/09/2018   PROT 7.8 02/09/2018   ALBUMIN 4.3 01/02/2017   CALCIUM 9.7 02/09/2018   GFRAA 126 02/09/2018   QFTBGOLDPLUS NEGATIVE 12/04/2017    Speciality Comments: No specialty comments available.  Procedures:  No procedures performed Allergies: Patient has no known allergies.   Assessment / Plan:     Visit Diagnoses: Rheumatoid arthritis involving multiple sites with positive rheumatoid factor (HCC) - Positive RF, positive anti-CCP, elevated ESR, erosive disease with nodulosis: She has no obvious active synovitis on exam.  She has been having increased pain in both hands and the right wrist joint for the past 2 weeks. She has limited ROM and tenderness of the right wrist on exam.  Synovial thickening  of the right wrist. She is on Humira 40 mg sq injections every 14 days, Rasuvo 20 mg sq injections weekly, and folic acid 2 mg po daily.  She has not missed any doses recently.   We discussed switching to another biologic but she would like to hold off at this time.  A prednisone taper starting at 20 mg and tapering by 5 mg every 4 days will be sent to the pharmacy.  A refill of voltaren gel was also sent to the pharmacy.  She was advised to notify us if she develops increased joint pain or joint swelling.  She will follow up in 3 months.  If she continues to have recurrent flares, we will consider switching her to Rinvoq or Orencia.   High risk medication use - Humira 40 mg every 14 days, Rasuvo 20 mg sq injection once weekly, folic acid 2 mg po daily. Inadequate response to Enbrel in the past.  CBC and CMP were drawn on 02/09/2018.  She will return in March and every 3 months for lab work.  TB gold was negative on 12/04/2017.  She has not had any recent infections.  Rheumatoid nodule, right elbow (HCC) - Resolved  Rheumatoid nodule, left elbow (HCC) - Resolved  Trigger  little finger of left hand: She is been experiencing tenderness and locking.  We discussed buddy taping as well as using Voltaren gel topically.  We will schedule an ultrasound-guided cortisone injection.  Calcaneal spur of both feet: She has no discomfort in her feet at this time.  She wears proper fitting shoes.  Trochanteric bursitis, left hip: She has no tenderness on exam today.  Other medical conditions are listed as follows:  Dry mouth  History of hypertension   Orders: No orders of the defined types were placed in this encounter.  Meds ordered this encounter  Medications  . predniSONE (DELTASONE) 5 MG tablet    Sig: Take 4 tablets by mouth once daily x4 days, 3 tablets po daily x4 days, 2 tablets po daily x4 days, 1 tablet po daily x4 days.    Dispense:  40 tablet    Refill:  0  . diclofenac sodium (VOLTAREN) 1 % GEL    Sig: Apply 2 g to 4 g to affected joints up to 4 times daily PRN    Dispense:  4 Tube    Refill:  2    Face-to-face time spent with patient was 30 minutes. Greater than 50% of time was spent in counseling and coordination of care.  Follow-Up Instructions: Return in about 3 months (around 07/20/2018) for Rheumatoid arthritis, Osteoarthritis.   Ofilia Neas, PA-C   I examined and evaluated the patient with Hazel Sams PA.  He has synovial thickening on examination but no synovitis was noted.  She has been experiencing discomfort over right ulnar styloid and some of her digits.  She also had left fifth trigger finger.  We discussed switching therapy or doing a small prednisone taper.  She would prefer to have a prednisone taper at this time.  If her symptoms persist she should notify us.  I also offered a trigger finger injection but she would like to hold off right now.  The plan of care was discussed as noted above.  Bo Merino, MD  Note - This record has been created using Editor, commissioning.  Chart creation errors have been sought, but may not always   have been located. Such creation errors do not reflect on  the  standard of medical care. 

## 2018-04-20 NOTE — Telephone Encounter (Signed)
Received fax from [facility] stating they have made multiple attempts to contact patient to fill Humira CF Pen. They have been unable to reach the patient. Call back number [phone].

## 2018-04-21 ENCOUNTER — Ambulatory Visit (INDEPENDENT_AMBULATORY_CARE_PROVIDER_SITE_OTHER): Payer: 59 | Admitting: Physician Assistant

## 2018-04-21 ENCOUNTER — Encounter: Payer: Self-pay | Admitting: Physician Assistant

## 2018-04-21 VITALS — BP 111/75 | HR 92 | Resp 12 | Ht 62.0 in | Wt 125.0 lb

## 2018-04-21 DIAGNOSIS — M06322 Rheumatoid nodule, left elbow: Secondary | ICD-10-CM

## 2018-04-21 DIAGNOSIS — M06321 Rheumatoid nodule, right elbow: Secondary | ICD-10-CM

## 2018-04-21 DIAGNOSIS — M0579 Rheumatoid arthritis with rheumatoid factor of multiple sites without organ or systems involvement: Secondary | ICD-10-CM | POA: Diagnosis not present

## 2018-04-21 DIAGNOSIS — M7732 Calcaneal spur, left foot: Secondary | ICD-10-CM

## 2018-04-21 DIAGNOSIS — Z79899 Other long term (current) drug therapy: Secondary | ICD-10-CM | POA: Diagnosis not present

## 2018-04-21 DIAGNOSIS — M7731 Calcaneal spur, right foot: Secondary | ICD-10-CM

## 2018-04-21 DIAGNOSIS — M7062 Trochanteric bursitis, left hip: Secondary | ICD-10-CM

## 2018-04-21 DIAGNOSIS — R682 Dry mouth, unspecified: Secondary | ICD-10-CM

## 2018-04-21 DIAGNOSIS — M65352 Trigger finger, left little finger: Secondary | ICD-10-CM

## 2018-04-21 DIAGNOSIS — Z8679 Personal history of other diseases of the circulatory system: Secondary | ICD-10-CM

## 2018-04-21 MED ORDER — DICLOFENAC SODIUM 1 % TD GEL: TRANSDERMAL | 2 refills | Status: DC

## 2018-04-21 MED ORDER — PREDNISONE 5 MG PO TABS: ORAL_TABLET | ORAL | 0 refills | Status: DC

## 2018-04-21 NOTE — Telephone Encounter (Signed)
Patient advised Vickie [name] has been trying to reach her to fill Humira. Patient states she has not received a call. Patient states she will check to see if she needs a refill when she gets home ans contact the pharmacy.

## 2018-04-21 NOTE — Patient Instructions (Signed)
Standing Labs We placed an order today for your standing lab work.    Please come back and get your standing labs in Mid-March and every 3 months   We have open lab Monday through Friday from 8:30-11:30 AM and 1:30-4:00 PM  at the office of Dr. Pollyann Savoy.   You may experience shorter wait times on Monday and Friday afternoons. The office is located at [address], [address], [place], [place] [address] No appointment is necessary.   Labs are drawn by [facility].  You may receive a bill from [facility] for your lab work.  If you wish to have your labs drawn at another location, please call the office 24 hours in advance to send orders.  If you have any questions regarding directions or hours of operation,  please call [phone].   Just as a reminder please drink plenty of water prior to coming for your lab work. Thanks!

## 2018-04-29 ENCOUNTER — Ambulatory Visit (INDEPENDENT_AMBULATORY_CARE_PROVIDER_SITE_OTHER): Payer: 59 | Admitting: Rheumatology

## 2018-04-29 DIAGNOSIS — M65352 Trigger finger, left little finger: Secondary | ICD-10-CM

## 2018-04-29 DIAGNOSIS — M25531 Pain in right wrist: Secondary | ICD-10-CM

## 2018-04-29 MED ORDER — LIDOCAINE HCL 1 % IJ SOLN
0.5000 mL | INTRAMUSCULAR | Status: AC | PRN
  Administered 2018-04-29: .5 mL

## 2018-04-29 MED ORDER — TRIAMCINOLONE ACETONIDE 40 MG/ML IJ SUSP
20.0000 mg | INTRAMUSCULAR | Status: AC | PRN
  Administered 2018-04-29: 20 mg via INTRA_ARTICULAR

## 2018-04-29 MED ORDER — TRIAMCINOLONE ACETONIDE 40 MG/ML IJ SUSP
10.0000 mg | INTRAMUSCULAR | Status: AC | PRN
  Administered 2018-04-29: 10 mg

## 2018-04-29 MED ORDER — LIDOCAINE HCL 1 % IJ SOLN
1.0000 mL | INTRAMUSCULAR | Status: AC | PRN
  Administered 2018-04-29: 1 mL

## 2018-04-29 NOTE — Progress Notes (Signed)
   Procedure Note  Patient: Vickie [name]             Date of Birth: Aug 02, 1965           MRN: [id number]             Visit Date: 04/29/2018  Procedures: Visit Diagnoses: Right wrist pain  Trigger little finger of left hand   Patient has been experiencing pain and swelling in her right wrist joint and requests cortisone injection.  Different treatment options were discussed and she wants to proceed with the cortisone shot.  She also wants to get injection in her left trigger finger.  Medium Joint Inj: R radiocarpal on 04/29/2018 1:54 PM Indications: pain and joint swelling Details: 27 G 1.5 in needle, ultrasound-guided dorsal approach Medications: 1 mL lidocaine 1 %; 20 mg triamcinolone acetonide 40 MG/ML Immediately prior to procedure a time out was called to verify the correct patient, procedure, equipment, support staff and site/side marked as required. Patient was prepped and draped in the usual sterile fashion.   Hand/UE Inj: L small A1 for trigger finger on 04/29/2018 1:56 PM Indications: pain, tendon swelling and therapeutic Details: 27 G needle, ultrasound-guided volar approach Medications: 0.5 mL lidocaine 1 %; 10 mg triamcinolone acetonide 40 MG/ML Aspirate: 0 mL Immediately prior to procedure a time out was called to verify the correct patient, procedure, equipment, support staff and site/side marked as required. Patient was prepped and draped in the usual sterile fashion.    Patient tolerated the procedures well.  Left ring finger splint was applied.  Have also advised her to use a right wrist brace.  If her symptoms persist she should notify us.  Pollyann Savoy, MD

## 2018-05-08 ENCOUNTER — Other Ambulatory Visit: Payer: Self-pay | Admitting: Rheumatology

## 2018-05-08 DIAGNOSIS — M0579 Rheumatoid arthritis with rheumatoid factor of multiple sites without organ or systems involvement: Secondary | ICD-10-CM

## 2018-05-08 NOTE — Telephone Encounter (Signed)
Last Visit: 04/21/18 Next visit: 07/21/18  Labs: 02/19/18 WNL  Okay to refill per Dr. Corliss Skains

## 2018-05-12 ENCOUNTER — Ambulatory Visit: Payer: 59 | Admitting: Rheumatology

## 2018-05-28 ENCOUNTER — Ambulatory Visit (INDEPENDENT_AMBULATORY_CARE_PROVIDER_SITE_OTHER): Payer: 59 | Admitting: Internal Medicine

## 2018-05-28 ENCOUNTER — Encounter: Payer: Self-pay | Admitting: Internal Medicine

## 2018-05-28 ENCOUNTER — Other Ambulatory Visit: Payer: Self-pay

## 2018-05-28 DIAGNOSIS — W57XXXA Bitten or stung by nonvenomous insect and other nonvenomous arthropods, initial encounter: Secondary | ICD-10-CM

## 2018-05-28 DIAGNOSIS — S30860A Insect bite (nonvenomous) of lower back and pelvis, initial encounter: Secondary | ICD-10-CM | POA: Diagnosis not present

## 2018-05-28 NOTE — Progress Notes (Signed)
Virtual Visit via Video Note  I connected with@ on 05/28/18 at 10:00 AM EDT by a video enabled telemedicine application and verified that I am speaking with the correct person using two identifiers. Location patient: home Location provider: home office Persons participating in the virtual visit: patient, provider  [place] national recommendations  regarding COVID 19 pandemic   video visit is advised over in office visit for this patient.  Discussed the limitations of evaluation and management by telemedicine and  availability of in person appointments. The patient expressed understanding and agreed to proceed.   HPI: Vickie [name] Initiates Web visit because of symptoms related to a tick bite. Tick was noted on her left back that was probably attached 2 days.  Her husband took it off but there may be residual head in the area it is itchy but not painful and no systemic symptoms.  Uncertain what she should do. No recent travel it was not a tiny deer tick.   ROS: See pertinent positives and negatives per HPI. No fever  Other new sx  Has ra on high risk med   Had prednisone taper in February  Past Medical History:  Diagnosis Date  . Arthritis    RA Dx at age 53  . Bell's palsy   . Chicken pox   . Hypertension   . PVC (premature ventricular contraction) 01/02/2017  . Snoring 01/02/2017    Past Surgical History:  Procedure Laterality Date  . CESAREAN SECTION    . FOOT SURGERY      Family History  Problem Relation Age of Onset  . Rheum arthritis Mother   . Diabetes Neg Hx   . Hypertension Neg Hx   . Hyperlipidemia Neg Hx   . Stroke Neg Hx   . Colon cancer Neg Hx     SOCIAL HX:    Current Outpatient Medications:  .  amLODipine (NORVASC) 5 MG tablet, Take 1 tablet (5 mg total) by mouth daily., Disp: 90 tablet, Rfl: 3 .  diclofenac sodium (VOLTAREN) 1 % GEL, Apply 2 g to 4 g to affected joints up to 4 times daily PRN, Disp: 4 Tube, Rfl: 2 .  folic acid (FOLVITE) 1 MG tablet,  Take 1 mg by mouth daily. , Disp: , Rfl:  .  HUMIRA PEN 40 MG/0.4ML PNKT, INJECT SUBCUTANEOUSLY 40MG   EVERY 2 WEEKS, Disp: 6 each, Rfl: 0 .  predniSONE (DELTASONE) 5 MG tablet, Take 4 tablets by mouth once daily x4 days, 3 tablets po daily x4 days, 2 tablets po daily x4 days, 1 tablet po daily x4 days., Disp: 40 tablet, Rfl: 0 .  RASUVO 20 MG/0.4ML SOAJ, INJECT SUBCUTANEOUSLY ONE  AUTO-INJECTOR (20MG ) ONCE A WEEK, Disp: 12 pen, Rfl: 0  Current Facility-Administered Medications:  .  0.9 %  sodium chloride infusion, 500 mL, Intravenous, Once, Pyrtle, Carie Caddy, MD  EXAM:  VITALS per patient if applicable:  GENERAL: alert, oriented, appears well and in no acute distress looks well  Non toxic   HEENT: atraumatic, conjunttiva clear, no obvious abnormalities on inspection of external nose and ears SKIN: There is a small less than 1 cm insect bite with some central darkening but no nodule discharge or  bulls eye rash. Left mid back  trunk NECK: normal movements of the head and neck  LUNGS: on inspection no signs of respiratory distress, breathing rate appears normal, no obvious gross SOB, gasping or wheezing  CV: no obvious cyanosis  MS: moves all visible extremities  Normal  motions  PSYCH/NEURO: pleasant and cooperative, no obvious depression or anxiety, speech and thought processing grossly intact  ASSESSMENT AND PLAN:  Discussed the following assessment and plan:  Tick bite of back, initial encounter - mild local rx mild with itching  see plan assessment  Local reaction to tick bite  Very small no signs of infection if there is retained tick reassurance given that usually  eventually resolve itself ,local compresses hydrocortisone for itching get back with us if fever or systemic symptoms infected-looking etc.  For reassessment or intervention  Patient appears to understand and agrees.  Expectant management and discussion of plan and treatment with patient with opportunity to ask questions  and all were answered.   The patient was advised to call back or seek an in-person evaluation if worsening having concerns    or if the condition fails to improve as anticipated.   [name] , MD

## 2018-06-04 ENCOUNTER — Other Ambulatory Visit: Payer: Self-pay | Admitting: Rheumatology

## 2018-06-04 NOTE — Telephone Encounter (Signed)
Last Visit: 04/21/18 Next visit: 07/21/18  Labs: 02/09/18 Glucose 102 all other WNL TB gold: 12/04/17 Neg   Okay to refill per Dr. Corliss Skains

## 2018-07-09 NOTE — Progress Notes (Deleted)
Office Visit Note  Patient: Vickie [name]             Date of Birth: 1965-08-04           MRN: [id number]             PCP: Martinique, Betty G, MD Referring: Martinique, Betty G, MD Visit Date: 07/21/2018 Occupation: '@GUAROCC'$ @  Subjective:  No chief complaint on file.  Discussed switching from Humira to another agent due to continued elevated ESR and high titers of RF and anti--CCP.  Patient preferred to wait and discuss further at this visit.  She is on Humira 40 mg every 14 days and Rasuvo 20 mg weekly.  Last TB gold negative on 12/04/2017 and will monitor yearly.  Most recent CBC/CMP within normal limits on 02/09/2018.  Due for CBC/CMP today and will monitor every 3 months.  Standing orders are in place.  She received her flu vaccine in October.  Recommend Prevnar 13, Pneumovax 23, and Shingrix as indicated.  Recommend yearly skin exams.  History of Present Illness: Vickie [name] is a 53 y.o. female ***   Activities of Daily Living:  Patient reports morning stiffness for *** {minute/hour:19697}.   Patient {ACTIONS;DENIES/REPORTS:21021675::"Denies"} nocturnal pain.  Difficulty dressing/grooming: {ACTIONS;DENIES/REPORTS:21021675::"Denies"} Difficulty climbing stairs: {ACTIONS;DENIES/REPORTS:21021675::"Denies"} Difficulty getting out of chair: {ACTIONS;DENIES/REPORTS:21021675::"Denies"} Difficulty using hands for taps, buttons, cutlery, and/or writing: {ACTIONS;DENIES/REPORTS:21021675::"Denies"}  No Rheumatology ROS completed.   PMFS History:  Patient Active Problem List   Diagnosis Date Noted  . PVC (premature ventricular contraction) 01/02/2017  . Snoring 01/02/2017  . High risk medication use 10/24/2016  . Rheumatoid nodule, left elbow ([place]) 10/24/2016  . Rheumatoid nodule, right elbow ([place]) 10/24/2016  . Calcaneal spur of both feet 10/24/2016  . Dry mouth 10/24/2016  . Rheumatoid arthritis ([place]) 08/07/2016  . Hypertension, essential 08/07/2016    Past Medical History:   Diagnosis Date  . Arthritis    RA Dx at age 44  . Bell's palsy   . Chicken pox   . Hypertension   . PVC (premature ventricular contraction) 01/02/2017  . Snoring 01/02/2017    Family History  Problem Relation Age of Onset  . Rheum arthritis Mother   . Diabetes Neg Hx   . Hypertension Neg Hx   . Hyperlipidemia Neg Hx   . Stroke Neg Hx   . Colon cancer Neg Hx    Past Surgical History:  Procedure Laterality Date  . CESAREAN SECTION    . FOOT SURGERY     Social History   Social History Narrative  . Not on file   Immunization History  Administered Date(s) Administered  . Influenza Inj Mdck Quad Pf 11/28/2016  . Influenza,inj,Quad PF,6+ Mos 12/05/2015, 12/19/2017  . Tdap 09/26/2014     Objective: Vital Signs: There were no vitals taken for this visit.   Physical Exam   Musculoskeletal Exam: ***  CDAI Exam: CDAI Score: Not documented Patient Global Assessment: Not documented; Provider Global Assessment: Not documented Swollen: Not documented; Tender: Not documented Joint Exam   Not documented   There is currently no information documented on the homunculus. Go to the Rheumatology activity and complete the homunculus joint exam.  Investigation: No additional findings.  Imaging: No results found.  Recent Labs: Lab Results  Component Value Date   WBC 4.7 02/09/2018   HGB 14.6 02/09/2018   PLT 353 02/09/2018   NA 139 02/09/2018   K 4.3 02/09/2018   CL 106 02/09/2018   CO2 25 02/09/2018  GLUCOSE 102 (H) 02/09/2018   BUN 16 02/09/2018   CREATININE 0.53 02/09/2018   BILITOT 0.5 02/09/2018   ALKPHOS 79 01/02/2017   AST 20 02/09/2018   ALT 11 02/09/2018   PROT 7.8 02/09/2018   ALBUMIN 4.3 01/02/2017   CALCIUM 9.7 02/09/2018   GFRAA 126 02/09/2018   QFTBGOLDPLUS NEGATIVE 12/04/2017    Speciality Comments: No specialty comments available.  Procedures:  No procedures performed Allergies: Patient has no known allergies.   Assessment / Plan:      Visit Diagnoses: No diagnosis found.   Orders: No orders of the defined types were placed in this encounter.  No orders of the defined types were placed in this encounter.   Face-to-face time spent with patient was *** minutes. Greater than 50% of time was spent in counseling and coordination of care.  Follow-Up Instructions: No follow-ups on file.   Ofilia Neas, PA-C  Note - This record has been created using Dragon software.  Chart creation errors have been sought, but may not always  have been located. Such creation errors do not reflect on  the standard of medical care.

## 2018-07-21 ENCOUNTER — Ambulatory Visit: Payer: Self-pay | Admitting: Rheumatology

## 2018-07-23 NOTE — Progress Notes (Signed)
Office Visit Note  Patient: Vickie [name]             Date of Birth: [birth year]           MRN: [id number]             PCP: Martinique, Betty G, MD Referring: Martinique, Betty G, MD Visit Date: 08/06/2018 Occupation: '@GUAROCC' @  Subjective:  Medication monitoring    History of Present Illness: Vickie [name] is a 53 y.o. female with history of seropositive rheumatoid arthritis and trochanteric bursitis. She is on Humira 40 mg sq injections every 14 days, Rasuvo 20 mg sq injections once weekly, and folic acid 2 mg po daily.  She states she needs a refill of Rasuvo at this time.  She has not missed any doses recently.  Overall she states that she is doing well on her current regimen.  Her right wrist pain has improved since her trigger finger injections in March.  She denies any other joint pain or joint swelling.  Activities of Daily Living:  Patient reports morning stiffness for 0   minutes.   Patient Denies nocturnal pain.  Difficulty dressing/grooming: Denies Difficulty climbing stairs: Denies Difficulty getting out of chair: Denies Difficulty using hands for taps, buttons, cutlery, and/or writing: Denies  Review of Systems  Constitutional: Negative for fatigue.  HENT: Negative for mouth sores and mouth dryness.   Eyes: Negative for dryness.  Respiratory: Negative for shortness of breath.   Cardiovascular: Negative for swelling in legs/feet.  Gastrointestinal: Negative for abdominal pain, constipation and diarrhea.  Endocrine: Negative for cold intolerance.  Musculoskeletal: Negative for arthralgias, joint pain, joint swelling, myalgias, muscle weakness, morning stiffness and myalgias.  Skin: Negative for color change, rash and hair loss.  Neurological: Negative for dizziness and headaches.  Psychiatric/Behavioral: Positive for sleep disturbance. Negative for depressed mood. The patient is nervous/anxious.     PMFS History:  Patient Active Problem List   Diagnosis Date Noted  . PVC  (premature ventricular contraction) 01/02/2017  . Snoring 01/02/2017  . High risk medication use 10/24/2016  . Rheumatoid nodule, left elbow ([place]) 10/24/2016  . Rheumatoid nodule, right elbow ([place]) 10/24/2016  . Calcaneal spur of both feet 10/24/2016  . Dry mouth 10/24/2016  . Rheumatoid arthritis ([place]) 08/07/2016  . Hypertension, essential 08/07/2016    Past Medical History:  Diagnosis Date  . Arthritis    RA Dx at age 20  . Bell's palsy   . Chicken pox   . Hypertension   . PVC (premature ventricular contraction) 01/02/2017  . Snoring 01/02/2017    Family History  Problem Relation Age of Onset  . Rheum arthritis Mother   . Diabetes Neg Hx   . Hypertension Neg Hx   . Hyperlipidemia Neg Hx   . Stroke Neg Hx   . Colon cancer Neg Hx    Past Surgical History:  Procedure Laterality Date  . CESAREAN SECTION    . FOOT SURGERY     Social History   Social History Narrative  . Not on file   Immunization History  Administered Date(s) Administered  . Influenza Inj Mdck Quad Pf 11/28/2016  . Influenza,inj,Quad PF,6+ Mos 12/05/2015, 12/19/2017  . Tdap 09/26/2014     Objective: Vital Signs: BP 101/68 (BP Location: Right Arm, Patient Position: Sitting, Cuff Size: Normal)   Pulse 80   Resp 14   Ht '5\' 2"'  (1.575 m)   Wt 126 lb (57.2 kg)   BMI 23.05 kg/m  Physical Exam Vitals signs and nursing note reviewed.  Constitutional:      Appearance: She is well-developed.  HENT:     Head: Normocephalic and atraumatic.  Eyes:     Conjunctiva/sclera: Conjunctivae normal.  Neck:     Musculoskeletal: Normal range of motion.  Cardiovascular:     Rate and Rhythm: Normal rate and regular rhythm.     Heart sounds: Normal heart sounds.  Pulmonary:     Effort: Pulmonary effort is normal.     Breath sounds: Normal breath sounds.  Abdominal:     General: Bowel sounds are normal.     Palpations: Abdomen is soft.  Lymphadenopathy:     Cervical: No cervical adenopathy.  Skin:     General: Skin is warm and dry.     Capillary Refill: Capillary refill takes less than 2 seconds.  Neurological:     Mental Status: She is alert and oriented to person, place, and time.  Psychiatric:        Behavior: Behavior normal.      Musculoskeletal Exam: C-spine, thoracic spine, lumbar spine good range of motion.  No midline spinal tenderness.  No SI joint tenderness.  Shoulder joints and elbow joints have good range of motion with no discomfort.  Right wrist limited range of motion.  She has synovial thickening of bilateral wrist.  Ulnar deviation of MCP joints.  She has no real thickening of several MCP joints.  Hip joints, knee joints, ankle joints, MCPs, PIPs, DIPs good range of motion with no synovitis.  No warmth or effusion of knee joints.  No tenderness or swelling of ankle joints.  No tenderness over trochanteric bursa bilaterally.   CDAI Exam: CDAI Score: - Patient Global: -; Provider Global: - Swollen: -; Tender: - Joint Exam   No joint exam has been documented for this visit   There is currently no information documented on the homunculus. Go to the Rheumatology activity and complete the homunculus joint exam.  Investigation: No additional findings.  Imaging: No results found.  Recent Labs: Lab Results  Component Value Date   WBC 4.7 02/09/2018   HGB 14.6 02/09/2018   PLT 353 02/09/2018   NA 139 02/09/2018   K 4.3 02/09/2018   CL 106 02/09/2018   CO2 25 02/09/2018   GLUCOSE 102 (H) 02/09/2018   BUN 16 02/09/2018   CREATININE 0.53 02/09/2018   BILITOT 0.5 02/09/2018   ALKPHOS 79 01/02/2017   AST 20 02/09/2018   ALT 11 02/09/2018   PROT 7.8 02/09/2018   ALBUMIN 4.3 01/02/2017   CALCIUM 9.7 02/09/2018   GFRAA 126 02/09/2018   QFTBGOLDPLUS NEGATIVE 12/04/2017    Speciality Comments: No specialty comments available.  Procedures:  No procedures performed Allergies: Patient has no known allergies.   Assessment / Plan:     Visit Diagnoses: Rheumatoid  arthritis involving multiple sites with positive rheumatoid factor (HCC) - Positive RF, positive anti-CCP, elevated ESR, erosive disease with nodulosis: She has no synovitis on exam today.  She has not had any recent rheumatoid arthritis flares.  She is clinically doing well on Humira 40 mg subcutaneous injections every 14 days and Rasuvo 20 mg subcutaneous injections once weekly.  She has not missed any doses recently.  She feels that Humira and Rasuvo are not effective combination of medications.  She has no joint pain or joint swelling at this time.  She has been able to be more active and recently bought a bicycle. She will continue on this  current treatment regimen. A refill of methotrexate was sent to the pharmacy today.  She is advised to notify us if she develops increased joint pain or joint swelling.  She will follow-up in the office in 5 months.- Plan: Methotrexate, PF, (RASUVO) 20 MG/0.4ML SOAJ   High risk medication use - Humira 40 mg every 14 days, Rasuvo 20 mg sq injection once weekly, folic acid 2 mg po daily. Inadequate response to Enbrel in the past.  ESR, RF, and anti-CCP antibodies were still elevated in December 2019.  Last TB gold negative on 12/04/2017 and will monitor yearly.  Most recent CBC/CMP within normal limits on 02/09/2018.  She is overdue for labs.  CBC/CMP ordered for today will monitor every 3 months.  Standing orders are in place.  She received her flu vaccine in October.  We discussed the importance of having yearly skin exams while on Humira.  She was also reminded to hold Humira and Rasuvo anytime she has an infection and to resume once the infection is completely cleared.  We discussed the importance of social distancing and following the standard precautions recommended by the CDC.- Plan: CBC with Differential/Platelet, COMPLETE METABOLIC PANEL WITH GFR  Rheumatoid nodule, right elbow (HCC) - Resolved   Rheumatoid nodule, left elbow (HCC) - Resolved   Trigger little  finger of left hand - Resolved.   Calcaneal spur of both feet - She has no discomfort at this time.   Trochanteric bursitis, left hip - Resolved.  She has no tenderness on exam today.   History of hypertension - BP was well controlled in the office today-101/68.   Dry mouth - Asymptomatic at this time.   Orders: Orders Placed This Encounter  Procedures  . CBC with Differential/Platelet  . COMPLETE METABOLIC PANEL WITH GFR   Meds ordered this encounter  Medications  . Methotrexate, PF, (RASUVO) 20 MG/0.4ML SOAJ    Sig: Inject 1 pen into the skin every 7 (seven) days.    Dispense:  12 pen    Refill:  0     Follow-Up Instructions: Return in about 5 months (around 01/06/2019) for Rheumatoid arthritis.   Ofilia Neas, PA-C  Note - This record has been created using Dragon software.  Chart creation errors have been sought, but may not always  have been located. Such creation errors do not reflect on  the standard of medical care.

## 2018-08-06 ENCOUNTER — Ambulatory Visit (INDEPENDENT_AMBULATORY_CARE_PROVIDER_SITE_OTHER): Payer: 59 | Admitting: Physician Assistant

## 2018-08-06 ENCOUNTER — Encounter: Payer: Self-pay | Admitting: Physician Assistant

## 2018-08-06 ENCOUNTER — Other Ambulatory Visit: Payer: Self-pay

## 2018-08-06 VITALS — BP 101/68 | HR 80 | Resp 14 | Ht 62.0 in | Wt 126.0 lb

## 2018-08-06 DIAGNOSIS — Z79899 Other long term (current) drug therapy: Secondary | ICD-10-CM

## 2018-08-06 DIAGNOSIS — M65352 Trigger finger, left little finger: Secondary | ICD-10-CM

## 2018-08-06 DIAGNOSIS — M7731 Calcaneal spur, right foot: Secondary | ICD-10-CM

## 2018-08-06 DIAGNOSIS — M06322 Rheumatoid nodule, left elbow: Secondary | ICD-10-CM | POA: Diagnosis not present

## 2018-08-06 DIAGNOSIS — M06321 Rheumatoid nodule, right elbow: Secondary | ICD-10-CM

## 2018-08-06 DIAGNOSIS — Z8679 Personal history of other diseases of the circulatory system: Secondary | ICD-10-CM

## 2018-08-06 DIAGNOSIS — M7062 Trochanteric bursitis, left hip: Secondary | ICD-10-CM

## 2018-08-06 DIAGNOSIS — M0579 Rheumatoid arthritis with rheumatoid factor of multiple sites without organ or systems involvement: Secondary | ICD-10-CM | POA: Diagnosis not present

## 2018-08-06 DIAGNOSIS — M7732 Calcaneal spur, left foot: Secondary | ICD-10-CM

## 2018-08-06 DIAGNOSIS — R682 Dry mouth, unspecified: Secondary | ICD-10-CM

## 2018-08-06 MED ORDER — RASUVO 20 MG/0.4ML ~~LOC~~ SOAJ: 1.0000 "pen " | SUBCUTANEOUS | 0 refills | Status: DC

## 2018-08-07 ENCOUNTER — Other Ambulatory Visit: Payer: Self-pay | Admitting: *Deleted

## 2018-08-07 LAB — COMPLETE METABOLIC PANEL WITH GFR
AG Ratio: 1.2 (calc) (ref 1.0–2.5)
ALT: 19 U/L (ref 6–29)
AST: 25 U/L (ref 10–35)
Albumin: 4.2 g/dL (ref 3.6–5.1)
Alkaline phosphatase (APISO): 79 U/L (ref 37–153)
BUN: 11 mg/dL (ref 7–25)
CO2: 27 mmol/L (ref 20–32)
Calcium: 9.5 mg/dL (ref 8.6–10.4)
Chloride: 104 mmol/L (ref 98–110)
Creat: 0.55 mg/dL (ref 0.50–1.05)
GFR, Est African American: 124 mL/min/{1.73_m2} (ref >=60)
GFR, Est Non African American: 107 mL/min/{1.73_m2} (ref >=60)
Globulin: 3.6 g/dL (calc) (ref 1.9–3.7)
Glucose, Bld: 93 mg/dL (ref 65–99)
Potassium: 4.1 mmol/L (ref 3.5–5.3)
Sodium: 139 mmol/L (ref 135–146)
Total Bilirubin: 0.8 mg/dL (ref 0.2–1.2)
Total Protein: 7.8 g/dL (ref 6.1–8.1)

## 2018-08-07 LAB — CBC WITH DIFFERENTIAL/PLATELET
Absolute Monocytes: 336 cells/uL (ref 200–950)
Basophils Absolute: 10 cells/uL (ref 0–200)
Basophils Relative: 0.3 %
Eosinophils Absolute: 10 cells/uL — ABNORMAL LOW (ref 15–500)
Eosinophils Relative: 0.3 %
HCT: 42 % (ref 35.0–45.0)
Hemoglobin: 14.1 g/dL (ref 11.7–15.5)
Lymphs Abs: 1866 cells/uL (ref 850–3900)
MCH: 31.3 pg (ref 27.0–33.0)
MCHC: 33.6 g/dL (ref 32.0–36.0)
MCV: 93.1 fL (ref 80.0–100.0)
MPV: 10.1 fL (ref 7.5–12.5)
Monocytes Relative: 10.5 %
Neutro Abs: 979 cells/uL — ABNORMAL LOW (ref 1500–7800)
Neutrophils Relative %: 30.6 %
Platelets: 284 10*3/uL (ref 140–400)
RBC: 4.51 10*6/uL (ref 3.80–5.10)
RDW: 13.4 % (ref 11.0–15.0)
Total Lymphocyte: 58.3 %
WBC: 3.2 10*3/uL — ABNORMAL LOW (ref 3.8–10.8)

## 2018-08-07 NOTE — Progress Notes (Signed)
WBC count is low.  Please notify patient and advise patient to return to recheck CBC in 1 month.  CMP WNL.

## 2018-08-10 ENCOUNTER — Other Ambulatory Visit: Payer: Self-pay | Admitting: *Deleted

## 2018-08-10 DIAGNOSIS — Z79899 Other long term (current) drug therapy: Secondary | ICD-10-CM

## 2018-09-10 ENCOUNTER — Other Ambulatory Visit: Payer: Self-pay | Admitting: *Deleted

## 2018-09-10 DIAGNOSIS — Z79899 Other long term (current) drug therapy: Secondary | ICD-10-CM

## 2018-09-11 LAB — COMPLETE METABOLIC PANEL WITH GFR
AG Ratio: 1.2 (calc) (ref 1.0–2.5)
ALT: 17 U/L (ref 6–29)
AST: 21 U/L (ref 10–35)
Albumin: 4.3 g/dL (ref 3.6–5.1)
Alkaline phosphatase (APISO): 74 U/L (ref 37–153)
BUN: 14 mg/dL (ref 7–25)
CO2: 27 mmol/L (ref 20–32)
Calcium: 9.8 mg/dL (ref 8.6–10.4)
Chloride: 104 mmol/L (ref 98–110)
Creat: 0.53 mg/dL (ref 0.50–1.05)
GFR, Est African American: 126 mL/min/{1.73_m2} (ref >=60)
GFR, Est Non African American: 108 mL/min/{1.73_m2} (ref >=60)
Globulin: 3.7 g/dL (calc) (ref 1.9–3.7)
Glucose, Bld: 95 mg/dL (ref 65–99)
Potassium: 4.3 mmol/L (ref 3.5–5.3)
Sodium: 140 mmol/L (ref 135–146)
Total Bilirubin: 0.9 mg/dL (ref 0.2–1.2)
Total Protein: 8 g/dL (ref 6.1–8.1)

## 2018-09-11 LAB — CBC WITH DIFFERENTIAL/PLATELET
Absolute Monocytes: 292 cells/uL (ref 200–950)
Basophils Absolute: 11 cells/uL (ref 0–200)
Basophils Relative: 0.3 %
Eosinophils Absolute: 11 cells/uL — ABNORMAL LOW (ref 15–500)
Eosinophils Relative: 0.3 %
HCT: 43.1 % (ref 35.0–45.0)
Hemoglobin: 14.2 g/dL (ref 11.7–15.5)
Lymphs Abs: 2016 cells/uL (ref 850–3900)
MCH: 31 pg (ref 27.0–33.0)
MCHC: 32.9 g/dL (ref 32.0–36.0)
MCV: 94.1 fL (ref 80.0–100.0)
MPV: 10.5 fL (ref 7.5–12.5)
Monocytes Relative: 8.1 %
Neutro Abs: 1271 cells/uL — ABNORMAL LOW (ref 1500–7800)
Neutrophils Relative %: 35.3 %
Platelets: 262 10*3/uL (ref 140–400)
RBC: 4.58 10*6/uL (ref 3.80–5.10)
RDW: 13.2 % (ref 11.0–15.0)
Total Lymphocyte: 56 %
WBC: 3.6 10*3/uL — ABNORMAL LOW (ref 3.8–10.8)

## 2018-09-11 NOTE — Progress Notes (Signed)
Labs are stable.

## 2018-10-09 ENCOUNTER — Other Ambulatory Visit: Payer: Self-pay | Admitting: Rheumatology

## 2018-10-09 NOTE — Telephone Encounter (Signed)
Please schedule patient a follow up visit. Patient due November 2020. Thanks! 

## 2018-10-09 NOTE — Telephone Encounter (Signed)
I LMOM for patient to call, and schedule her fu appt with Hazel Sams, [facility] around January 15, 2019.

## 2018-10-09 NOTE — Telephone Encounter (Signed)
Last Visit: 08/06/18 Next Visit: due November 2020 Labs: 09/10/18 Stable TB Gold: 12/04/17 Neg   Okay to refill per Dr. Estanislado Pandy

## 2018-10-25 ENCOUNTER — Other Ambulatory Visit: Payer: Self-pay | Admitting: Family Medicine

## 2018-10-25 DIAGNOSIS — I10 Essential (primary) hypertension: Secondary | ICD-10-CM

## 2018-10-29 ENCOUNTER — Encounter: Payer: Self-pay | Admitting: Family Medicine

## 2018-10-29 DIAGNOSIS — I10 Essential (primary) hypertension: Secondary | ICD-10-CM

## 2018-11-05 MED ORDER — AMLODIPINE BESYLATE 5 MG PO TABS: 5.0000 mg | ORAL_TABLET | Freq: Every day | ORAL | 3 refills | Status: DC

## 2018-11-27 ENCOUNTER — Other Ambulatory Visit: Payer: Self-pay | Admitting: Physician Assistant

## 2018-11-27 DIAGNOSIS — M0579 Rheumatoid arthritis with rheumatoid factor of multiple sites without organ or systems involvement: Secondary | ICD-10-CM

## 2018-11-27 NOTE — Telephone Encounter (Signed)
Last Visit: 08/06/18 Next Visit: 01/07/19 Labs: 09/10/18 stable  Okay to refill per Dr. Estanislado Pandy

## 2018-12-24 NOTE — Progress Notes (Signed)
Office Visit Note  Patient: Vickie [name]             Date of Birth: 15-Nov-1965           MRN: [id number]             PCP: Martinique, Betty G, MD Referring: Martinique, Betty G, MD Visit Date: 01/07/2019 Occupation: _0 @  Subjective:  Pain in both feet   History of Present Illness: [name] is a 53 y.o. female with history of seropositive rheumatoid arthritis.  She is on Humira 40 mg sq injections every 14 days, Rasuvo 20 mg sq injections once weekly, and folic acid 2 mg po daily.  She has not missed any doses of her medications recently.  She states for the past 1 to 2 weeks she has been having increased pain in bilateral feet.  She denies any obvious swelling.  She denies any increase in her activity level.  She states that she does ballroom dance once a week but does not feel that this is related.  She denies changing her shoes recently.  She denies any other joint pain or joint swelling at this time.    Activities of Daily Living:  Patient reports morning stiffness for 0 minutes.   Patient Denies nocturnal pain.  Difficulty dressing/grooming: Denies Difficulty climbing stairs: Denies Difficulty getting out of chair: Denies Difficulty using hands for taps, buttons, cutlery, and/or writing: Denies  Review of Systems  Constitutional: Negative for fatigue.  HENT: Negative for mouth sores, mouth dryness and nose dryness.   Eyes: Negative for pain, visual disturbance and dryness.  Respiratory: Negative for cough, hemoptysis, shortness of breath and difficulty breathing.   Cardiovascular: Negative for chest pain, palpitations, hypertension and swelling in legs/feet.  Gastrointestinal: Negative for blood in stool, constipation and diarrhea.  Endocrine: Negative for increased urination.  Genitourinary: Negative for painful urination.  Musculoskeletal: Positive for arthralgias and joint pain. Negative for joint swelling, myalgias, muscle weakness, morning stiffness, muscle tenderness  and myalgias.  Skin: Negative for color change, pallor, rash, hair loss, nodules/bumps, skin tightness, ulcers and sensitivity to sunlight.  Allergic/Immunologic: Negative for susceptible to infections.  Neurological: Negative for dizziness, numbness, headaches, memory loss and weakness.  Hematological: Negative for swollen glands.  Psychiatric/Behavioral: Negative for depressed mood and sleep disturbance. The patient is nervous/anxious.     PMFS History:  Patient Active Problem List   Diagnosis Date Noted   PVC (premature ventricular contraction) 01/02/2017   Snoring 01/02/2017   High risk medication use 10/24/2016   Rheumatoid nodule, left elbow ([place]) 10/24/2016   Rheumatoid nodule, right elbow ([place]) 10/24/2016   Calcaneal spur of both feet 10/24/2016   Dry mouth 10/24/2016   Rheumatoid arthritis ([place]) 08/07/2016   Hypertension, essential 08/07/2016    Past Medical History:  Diagnosis Date   Arthritis    RA Dx at age 21   Bell's palsy    Chicken pox    Hypertension    PVC (premature ventricular contraction) 01/02/2017   Snoring 01/02/2017    Family History  Problem Relation Age of Onset   Rheum arthritis Mother    Diabetes Neg Hx    Hypertension Neg Hx    Hyperlipidemia Neg Hx    Stroke Neg Hx    Colon cancer Neg Hx    Past Surgical History:  Procedure Laterality Date   CESAREAN SECTION     FOOT SURGERY     Social History   Social History Narrative   Not  on file   Immunization History  Administered Date(s) Administered   Influenza Inj Mdck Quad Pf 11/28/2016   Influenza,inj,Quad PF,6+ Mos 12/05/2015, 12/19/2017   Tdap 09/26/2014     Objective: Vital Signs: BP 110/77 (BP Location: Left Arm, Patient Position: Sitting, Cuff Size: Normal)    Pulse 89    Resp 12    Ht _0  (1.575 m)    Wt 130 lb 6.4 oz (59.1 kg)    BMI 23.85 kg/m    Physical Exam Vitals signs and nursing note reviewed.  Constitutional:      Appearance: She is  well-developed.  HENT:     Head: Normocephalic and atraumatic.  Eyes:     Conjunctiva/sclera: Conjunctivae normal.  Neck:     Musculoskeletal: Normal range of motion.  Cardiovascular:     Rate and Rhythm: Normal rate and regular rhythm.     Heart sounds: Normal heart sounds.  Pulmonary:     Effort: Pulmonary effort is normal.     Breath sounds: Normal breath sounds.  Abdominal:     General: Bowel sounds are normal.     Palpations: Abdomen is soft.  Lymphadenopathy:     Cervical: No cervical adenopathy.  Skin:    General: Skin is warm and dry.     Capillary Refill: Capillary refill takes less than 2 seconds.  Neurological:     Mental Status: She is alert and oriented to person, place, and time.  Psychiatric:        Behavior: Behavior normal.      Musculoskeletal Exam: C-spine, thoracic spine, and lumbar spine good ROM.  Shoulder joints and elbow joints good ROM with no tenderness or synovitis.  Small rheumatoid nodule on extensor surface of the left elbow joint.  Limited ROM of both wrist joints.  Synovial thickening and ulnar deviation of MCP joints noted. Hip joints, knee joints, ankle joints good ROM.  No warmth or effusion of knee joints.  No tenderness or swelling of ankle joints.  Tenderness of all MTP joints. Hammertoes noted.    CDAI Exam: CDAI Score: 0.2  Patient Global: 1 mm; Provider Global: 1 mm Swollen: 0 ; Tender: 0  Joint Exam   No joint exam has been documented for this visit   There is currently no information documented on the homunculus. Go to the Rheumatology activity and complete the homunculus joint exam.  Investigation: No additional findings.  Imaging: No results found.  Recent Labs: Lab Results  Component Value Date   WBC 3.6 (L) 09/10/2018   HGB 14.2 09/10/2018   PLT 262 09/10/2018   NA 140 09/10/2018   K 4.3 09/10/2018   CL 104 09/10/2018   CO2 27 09/10/2018   GLUCOSE 95 09/10/2018   BUN 14 09/10/2018   CREATININE 0.53 09/10/2018    BILITOT 0.9 09/10/2018   ALKPHOS 79 01/02/2017   AST 21 09/10/2018   ALT 17 09/10/2018   PROT 8.0 09/10/2018   ALBUMIN 4.3 01/02/2017   CALCIUM 9.8 09/10/2018   GFRAA 126 09/10/2018   QFTBGOLDPLUS NEGATIVE 12/04/2017    Speciality Comments: No specialty comments available.  Procedures:  No procedures performed Allergies: Patient has no known allergies.   Assessment / Plan:     Visit Diagnoses: Rheumatoid arthritis involving multiple sites with positive rheumatoid factor (HCC) - Positive RF, positive anti-CCP, elevated ESR, erosive disease with nodulosis: She has no obvious synovitis on exam.  She presents today with increased pain in bilateral feet which started 1 to 2 weeks  ago.  She has tenderness of all MTP joints.  Hammertoes noted.  She has not been performing any overuse activities recently.  She continues to ballroom dance once weekly.  She has no other joint pain or joint swelling at this time.  X-rays of both hands and both feet were obtained today to assess for interval change.  Overall she is clinically been doing well on Humira 40 mg subcutaneous injections every 14 days, Rasuvo 20 mg sq once weekly, and folic acid 2 mg by mouth daily.  She will continue on his current treatment regimen.  She does not need any refills at this time.  She was advised to notify us if she develops increased joint pain or joint swelling.  She will follow-up in the office in 3 months.- Plan: XR Hand 2 View Left, XR Hand 2 View Right, XR Foot 2 Views Left, XR Foot 2 Views Right  High risk medication use - Humira 40 mg every 14 days, Rasuvo 20 mg every 7 days, and folic acid 2 mg daily. Last TB gold negative on 12/04/2017.  Due for repeat TB gold today and will monitor yearly.  Most recent CBC/CMP within normal limits except for low WBC count on 09/10/2018.  Due for CBC/CMP today and will monitor every 3 months.  Inadequate response to Enbrel in the past. - Plan: CBC, CMP, QuantiFERON-TB Gold  Plus  Rheumatoid nodule, right elbow (HCC) - Resolved.  Rheumatoid nodule, left elbow (HCC) -She has a small rheumatoid nodule on the left elbow joint.   Trigger little finger of left hand - Resolved.  Pain in both feet -She she presents today with increased pain in bilateral feet.  She has tenderness of all MTP joints.  No obvious synovitis was noted.  Hammertoes present.  X-rays of both feet were updated today.  Plan: XR Foot 2 Views Left, XR Foot 2 Views Right  Calcaneal spur of both feet: X-rays of both feet were obtained today.   Trochanteric bursitis, left hip - She has tenderness over the left trochanteric bursa on exam.  She experiences intermittent symptoms.  She was advised to perform stretching exercises on a regular basis.   Dry mouth: She has not had any mouth dryness recently.   History of hypertension: BP was 110/77 today in the office.     Orders: Orders Placed This Encounter  Procedures   XR Hand 2 View Left   XR Hand 2 View Right   XR Foot 2 Views Left   XR Foot 2 Views Right   CBC   CMP   QuantiFERON-TB Gold Plus   No orders of the defined types were placed in this encounter.   Face-to-face time spent with patient was 30 minutes. Greater than 50% of time was spent in counseling and coordination of care.  Follow-Up Instructions: Return in about 5 months (around 06/07/2019) for Rheumatoid arthritis.   Ofilia Neas, PA-C  Note - This record has been created using Dragon software.  Chart creation errors have been sought, but may not always  have been located. Such creation errors do not reflect on  the standard of medical care.

## 2018-12-30 ENCOUNTER — Other Ambulatory Visit: Payer: Self-pay | Admitting: Family Medicine

## 2018-12-30 DIAGNOSIS — Z1231 Encounter for screening mammogram for malignant neoplasm of breast: Secondary | ICD-10-CM

## 2019-01-07 ENCOUNTER — Ambulatory Visit: Payer: Self-pay

## 2019-01-07 ENCOUNTER — Encounter: Payer: Self-pay | Admitting: Physician Assistant

## 2019-01-07 ENCOUNTER — Other Ambulatory Visit: Payer: Self-pay

## 2019-01-07 ENCOUNTER — Ambulatory Visit (INDEPENDENT_AMBULATORY_CARE_PROVIDER_SITE_OTHER): Payer: 59 | Admitting: Physician Assistant

## 2019-01-07 VITALS — BP 110/77 | HR 89 | Resp 12 | Ht 62.0 in | Wt 130.4 lb

## 2019-01-07 DIAGNOSIS — R682 Dry mouth, unspecified: Secondary | ICD-10-CM

## 2019-01-07 DIAGNOSIS — M79672 Pain in left foot: Secondary | ICD-10-CM | POA: Diagnosis not present

## 2019-01-07 DIAGNOSIS — M7062 Trochanteric bursitis, left hip: Secondary | ICD-10-CM

## 2019-01-07 DIAGNOSIS — M06322 Rheumatoid nodule, left elbow: Secondary | ICD-10-CM | POA: Diagnosis not present

## 2019-01-07 DIAGNOSIS — M7731 Calcaneal spur, right foot: Secondary | ICD-10-CM

## 2019-01-07 DIAGNOSIS — M06321 Rheumatoid nodule, right elbow: Secondary | ICD-10-CM

## 2019-01-07 DIAGNOSIS — M0579 Rheumatoid arthritis with rheumatoid factor of multiple sites without organ or systems involvement: Secondary | ICD-10-CM

## 2019-01-07 DIAGNOSIS — Z8679 Personal history of other diseases of the circulatory system: Secondary | ICD-10-CM

## 2019-01-07 DIAGNOSIS — M7732 Calcaneal spur, left foot: Secondary | ICD-10-CM

## 2019-01-07 DIAGNOSIS — Z79899 Other long term (current) drug therapy: Secondary | ICD-10-CM

## 2019-01-07 DIAGNOSIS — M79671 Pain in right foot: Secondary | ICD-10-CM | POA: Diagnosis not present

## 2019-01-07 DIAGNOSIS — M65352 Trigger finger, left little finger: Secondary | ICD-10-CM

## 2019-01-07 MED ORDER — FOLIC ACID 1 MG PO TABS: 2.0000 mg | ORAL_TABLET | Freq: Every day | ORAL | 0 refills | Status: AC

## 2019-01-07 NOTE — Patient Instructions (Signed)
Standing Labs We placed an order today for your standing lab work.    Please come back and get your standing labs in February and every 3 months  We have open lab daily Monday through Thursday from 8:30-12:30 PM and 1:30-4:30 PM and Friday from 8:30-12:30 PM and 1:30-4:00 PM at the office of Dr. Shaili Deveshwar.   You may experience shorter wait times on Monday and Friday afternoons. The office is located at [address], [address], [place], [place] [address] No appointment is necessary.   Labs are drawn by [facility].  You may receive a bill from [facility] for your lab work.  If you wish to have your labs drawn at another location, please call the office 24 hours in advance to send orders.  If you have any questions regarding directions or hours of operation,  please call [phone].   Just as a reminder please drink plenty of water prior to coming for your lab work. Thanks!  

## 2019-01-08 ENCOUNTER — Other Ambulatory Visit: Payer: Self-pay

## 2019-01-08 DIAGNOSIS — R7989 Other specified abnormal findings of blood chemistry: Secondary | ICD-10-CM

## 2019-01-08 NOTE — Progress Notes (Signed)
WBC count is low but stable.  Rest of CBC WNL.   ALT is borderline elevated-33.  Please advise patient to avoid taking NSAIDs, tylenol, and alcohol.  Recheck LFTs in 1 month.  Rest of CMP WNL.

## 2019-01-09 LAB — CBC WITH DIFFERENTIAL/PLATELET
Absolute Monocytes: 310 cells/uL (ref 200–950)
Basophils Absolute: 22 cells/uL (ref 0–200)
Basophils Relative: 0.6 %
Eosinophils Absolute: 22 cells/uL (ref 15–500)
Eosinophils Relative: 0.6 %
HCT: 42.8 % (ref 35.0–45.0)
Hemoglobin: 14.1 g/dL (ref 11.7–15.5)
Lymphs Abs: 1660 cells/uL (ref 850–3900)
MCH: 30.2 pg (ref 27.0–33.0)
MCHC: 32.9 g/dL (ref 32.0–36.0)
MCV: 91.6 fL (ref 80.0–100.0)
MPV: 10.4 fL (ref 7.5–12.5)
Monocytes Relative: 8.6 %
Neutro Abs: 1588 cells/uL (ref 1500–7800)
Neutrophils Relative %: 44.1 %
Platelets: 335 10*3/uL (ref 140–400)
RBC: 4.67 10*6/uL (ref 3.80–5.10)
RDW: 13.9 % (ref 11.0–15.0)
Total Lymphocyte: 46.1 %
WBC: 3.6 10*3/uL — ABNORMAL LOW (ref 3.8–10.8)

## 2019-01-09 LAB — QUANTIFERON-TB GOLD PLUS
Mitogen-NIL: >10 IU/mL
NIL: 0.08 IU/mL
QuantiFERON-TB Gold Plus: NEGATIVE
TB1-NIL: 0 IU/mL
TB2-NIL: <0 IU/mL

## 2019-01-09 LAB — COMPLETE METABOLIC PANEL WITH GFR
AG Ratio: 1.2 (calc) (ref 1.0–2.5)
ALT: 33 U/L — ABNORMAL HIGH (ref 6–29)
AST: 35 U/L (ref 10–35)
Albumin: 4.2 g/dL (ref 3.6–5.1)
Alkaline phosphatase (APISO): 67 U/L (ref 37–153)
BUN: 14 mg/dL (ref 7–25)
CO2: 26 mmol/L (ref 20–32)
Calcium: 9.7 mg/dL (ref 8.6–10.4)
Chloride: 105 mmol/L (ref 98–110)
Creat: 0.62 mg/dL (ref 0.50–1.05)
GFR, Est African American: 118 mL/min/{1.73_m2} (ref >=60)
GFR, Est Non African American: 102 mL/min/{1.73_m2} (ref >=60)
Globulin: 3.5 g/dL (calc) (ref 1.9–3.7)
Glucose, Bld: 88 mg/dL (ref 65–99)
Potassium: 4 mmol/L (ref 3.5–5.3)
Sodium: 141 mmol/L (ref 135–146)
Total Bilirubin: 0.8 mg/dL (ref 0.2–1.2)
Total Protein: 7.7 g/dL (ref 6.1–8.1)

## 2019-01-11 NOTE — Progress Notes (Signed)
TB gold negative

## 2019-01-15 ENCOUNTER — Other Ambulatory Visit: Payer: Self-pay | Admitting: Rheumatology

## 2019-01-15 ENCOUNTER — Telehealth: Payer: Self-pay | Admitting: Rheumatology

## 2019-01-15 NOTE — Telephone Encounter (Signed)
Patient called stating [facility] faxed a prescription refill request for Humira.  Patient states she didn't realize she had no refills remaining and is due to take her injection tomorrow 01/16/19.  Patient is requesting the prescription be called in verbally to [facility] on Monday morning so they are able to send it to her before Wednesday, 01/20/19.

## 2019-01-18 ENCOUNTER — Encounter: Payer: Self-pay | Admitting: Podiatry

## 2019-01-18 ENCOUNTER — Other Ambulatory Visit: Payer: Self-pay

## 2019-01-18 ENCOUNTER — Ambulatory Visit (INDEPENDENT_AMBULATORY_CARE_PROVIDER_SITE_OTHER): Payer: 59 | Admitting: Podiatry

## 2019-01-18 VITALS — BP 114/75 | HR 86

## 2019-01-18 DIAGNOSIS — M0579 Rheumatoid arthritis with rheumatoid factor of multiple sites without organ or systems involvement: Secondary | ICD-10-CM

## 2019-01-18 DIAGNOSIS — M79672 Pain in left foot: Secondary | ICD-10-CM | POA: Diagnosis not present

## 2019-01-18 DIAGNOSIS — L84 Corns and callosities: Secondary | ICD-10-CM | POA: Diagnosis not present

## 2019-01-18 DIAGNOSIS — M79671 Pain in right foot: Secondary | ICD-10-CM

## 2019-01-18 MED ORDER — HUMIRA (2 PEN) 40 MG/0.4ML ~~LOC~~ AJKT: 40.0000 mg | AUTO-INJECTOR | SUBCUTANEOUS | 0 refills | Status: DC

## 2019-01-18 NOTE — Telephone Encounter (Signed)
Last Visit: 01/07/2019 Next Visit: 04/08/2019 Labs: 01/07/2019 WBC count is low but stable. Rest of CBC WNL.   ALT is borderline elevated-33. TB Gold: 01/07/2019 TB gold negative.   Okay to refill per Dr. Estanislado Pandy.   Called [facility] and provided verbal prescription.   Advised patient, patient verbalized understanding.

## 2019-01-18 NOTE — Patient Instructions (Signed)

## 2019-01-23 NOTE — Progress Notes (Signed)
Subjective: Vickie [name] presents today with h/o rheumatoid arthritis and cc of painful, plantar calluses which interfere with daily activities and routine tasks. Pain is aggravated when weightbearing with and without enclosed shoe gear.  Calluses have been present for approximately one year and has become more painful over the past 2 weeks. She likes to ballroom dance and pain interferes with this as well. Pain is getting progressively worse. Patient is seeking professional help regarding symptoms.  Past Medical History:  Diagnosis Date  . Arthritis    RA Dx at age 74  . Bell's palsy   . Chicken pox   . Hypertension   . PVC (premature ventricular contraction) 01/02/2017  . Snoring 01/02/2017     Patient Active Problem List   Diagnosis Date Noted  . PVC (premature ventricular contraction) 01/02/2017  . Snoring 01/02/2017  . High risk medication use 10/24/2016  . Rheumatoid nodule, left elbow ([place]) 10/24/2016  . Rheumatoid nodule, right elbow ([place]) 10/24/2016  . Calcaneal spur of both feet 10/24/2016  . Dry mouth 10/24/2016  . Rheumatoid arthritis ([place]) 08/07/2016  . Hypertension, essential 08/07/2016     Past Surgical History:  Procedure Laterality Date  . CESAREAN SECTION    . FOOT SURGERY        Current Outpatient Medications:  .  tobramycin (TOBREX) 0.3 % ophthalmic solution, Place one drop into the left eye every 4 (four) hours for 7 days., Disp: , Rfl:  .  Adalimumab (HUMIRA PEN) 40 MG/0.4ML PNKT, Inject 40 mg into the skin every 14 (fourteen) days., Disp: 6 each, Rfl: 0 .  amLODipine (NORVASC) 5 MG tablet, Take 1 tablet (5 mg total) by mouth daily., Disp: 90 tablet, Rfl: 3 .  diclofenac sodium (VOLTAREN) 1 % GEL, Apply 2 g to 4 g to affected joints up to 4 times daily PRN, Disp: 4 Tube, Rfl: 2 .  folic acid (FOLVITE) 1 MG tablet, Take 2 tablets (2 mg total) by mouth daily., Disp: 180 tablet, Rfl: 0 .  RASUVO 20 MG/0.4ML SOAJ, INJECT 1 PEN INTO THE SKIN  EVERY 7 DAYS.,  Disp: 12 pen, Rfl: 0   No Known Allergies   Social History   Occupational History  . Not on file  Tobacco Use  . Smoking status: Never Smoker  . Smokeless tobacco: Never Used  Substance and Sexual Activity  . Alcohol use: Yes    Frequency: Never    Comment: occ  . Drug use: No  . Sexual activity: Not on file     Family History  Problem Relation Age of Onset  . Rheum arthritis Mother   . Diabetes Neg Hx   . Hypertension Neg Hx   . Hyperlipidemia Neg Hx   . Stroke Neg Hx   . Colon cancer Neg Hx      Immunization History  Administered Date(s) Administered  . Influenza Inj Mdck Quad Pf 11/28/2016  . Influenza,inj,Quad PF,6+ Mos 12/05/2015, 12/19/2017  . Tdap 09/26/2014     Review of systems: Positive Findings in bold print.  Constitutional:  chills, fatigue, fever, sweats, weight change Communication: Optometrist, sign Ecologist, hand writing, iPad/Android device Head: headaches, head injury Eyes: changes in vision, eye pain, glaucoma, cataracts, macular degeneration, diplopia, glare,  light sensitivity, eyeglasses or contacts, blindness Ears nose mouth throat: hearing impaired, hearing aids,  ringing in ears, deaf, sign language,  vertigo,   nosebleeds,  rhinitis,  cold sores, snoring, swollen glands Cardiovascular: HTN, edema, arrhythmia, pacemaker in place, defibrillator in place,  chest pain/tightness, chronic anticoagulation, blood clot, heart failure, MI Peripheral Vascular: leg cramps, varicose veins, blood clots, lymphedema, varicosities Respiratory:  difficulty breathing, denies congestion, SOB, wheezing, cough, emphysema Gastrointestinal: change in appetite or weight, abdominal pain, constipation, diarrhea, nausea, vomiting, vomiting blood, change in bowel habits, abdominal pain, jaundice, rectal bleeding, hemorrhoids, GERD Genitourinary:  nocturia,  pain on urination, polyuria,  blood in urine, Foley catheter, urinary urgency, ESRD on  hemodialysis Musculoskeletal: amputation, cramping, stiff joints, painful joints, decreased joint motion, fractures, OA, gout, hemiplegia, paraplegia, uses cane, wheelchair bound, uses walker, uses rollator Skin: changes in toenails, color change, dryness, itching, mole changes,  rash, wound(s) Neurological: headaches, numbness in feet, paresthesias in feet, burning in feet, fainting,  seizures, change in speech. denies headaches, memory problems/poor historian, cerebral palsy, weakness, paralysis, CVA, TIA Endocrine: diabetes, hypothyroidism, hyperthyroidism,  goiter, dry mouth, flushing, heat intolerance,  cold intolerance,  excessive thirst, denies polyuria,  nocturia Hematological:  easy bleeding, excessive bleeding, easy bruising, enlarged lymph nodes, on long term blood thinner, history of past transusions Allergy/immunological:  hives, eczema, frequent infections, multiple drug allergies, seasonal allergies, transplant recipient, multiple food allergies Psychiatric:  anxiety, depression, mood disorder, suicidal ideations, hallucinations, insomnia  Objective: Vitals:   01/18/19 0851  BP: 114/75  Pulse: 86   Vascular Examination: Capillary refill time immediate b/l.   Dorsalis pedis and posterior tibial pulses present b/l.  Digital hair present b/l.  Skin temperature gradient WNL b/l.  Dermatological Examination: Skin with normal turgor, texture and tone b/l.  Toenails 1-5 b/l well maintained with adequate length.  Hyperkeratotic lesion submet head 2 b/l submet head 3 left and submet head 4 right  with tenderness to palpation. No edema, no erythema, no drainage, no flocculence.  Musculoskeletal: Muscle strength 5/5 to all LE muscle groups.  Contracted digits 1-5 b/l.  Fibular deviation of fingers noted b/l.  Neurological: Sensation intact 5/5 b/l with 10 gram monofilament.  Vibratory sensation intact b/l.  Assessment: Painful calluses submet head 2 b/l submet head 3  left and submet head 4 right  Rheumatoid Arthritis Pain in feet  Plan: 1. Discussed diagnoses of calluses. Calluses pared submet head 2 b/l submet head 3 left and submet head 4 right foot.   2. Discussed conservative treatment of periodically paring calluses, custom orthotics and routine preventative visits. She was advised her insurance may not cover routine paring of calluses. She related understanding. 3. Discussed surgical intervention. She would like surgical consult to see what options are available. She is to schedule an appointment with Dr. Gala Lewandowsky at her convenience.  4. Patient to continue soft, supportive shoe gear daily. 5. Patient to report any pedal injuries to medical professional immediately. 6. Follow up 3 months with me for routine foot care..  7. Patient/POA to call should there be a concern in the interim.

## 2019-01-26 IMAGING — MG DIGITAL SCREENING BILATERAL MAMMOGRAM WITH TOMO AND CAD
8 series · 8 of 24 positions shown · non-contrast
Comparison: Previous exam(s).

CLINICAL DATA: Screening.

EXAM:
DIGITAL SCREENING BILATERAL MAMMOGRAM WITH TOMO AND CAD

[R CC synth-2D]
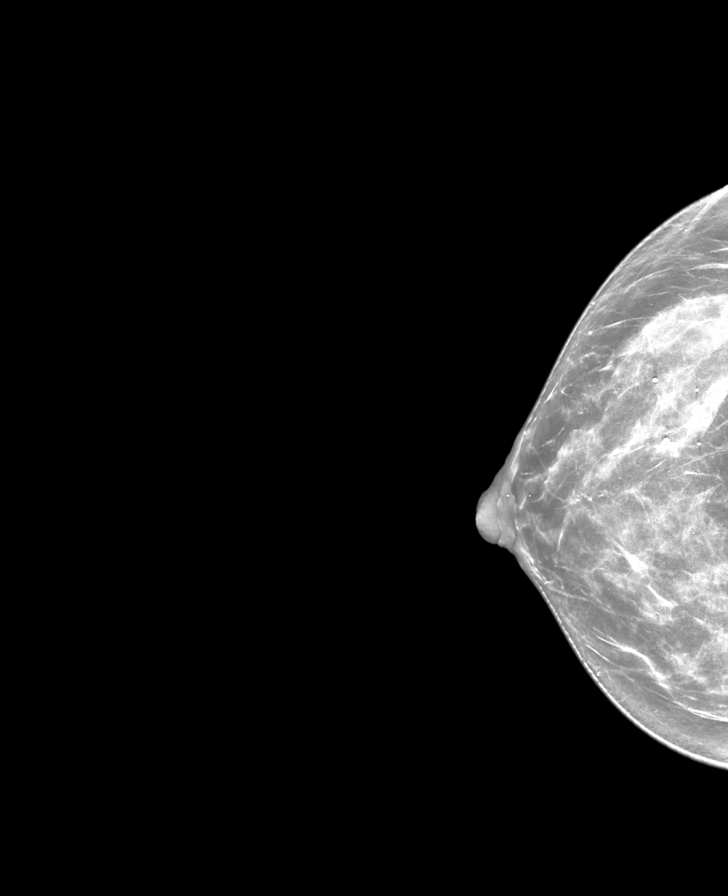

[L MLO synth-2D]
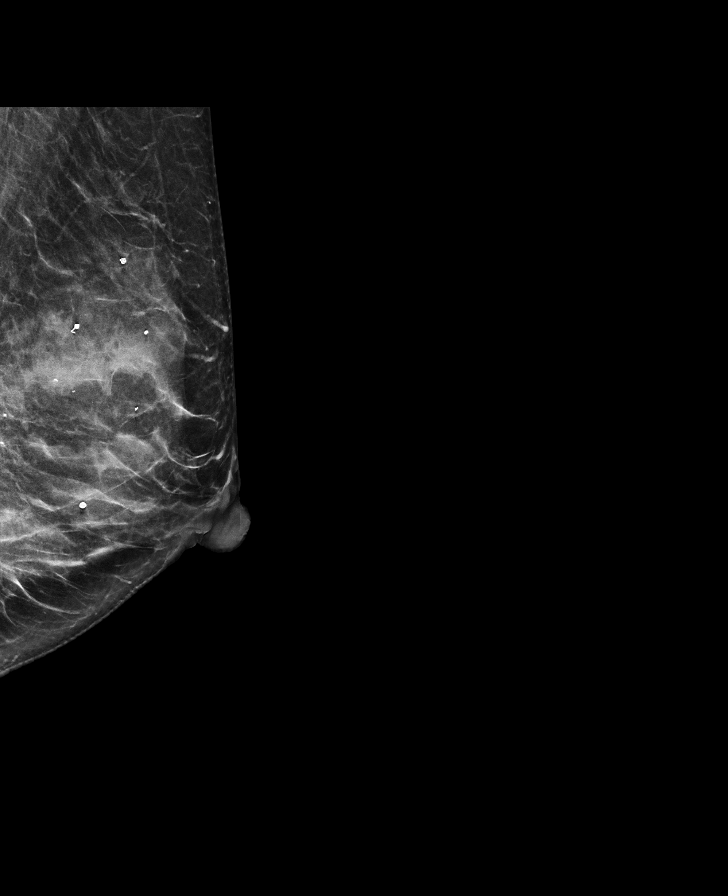

[R MLO synth-2D]
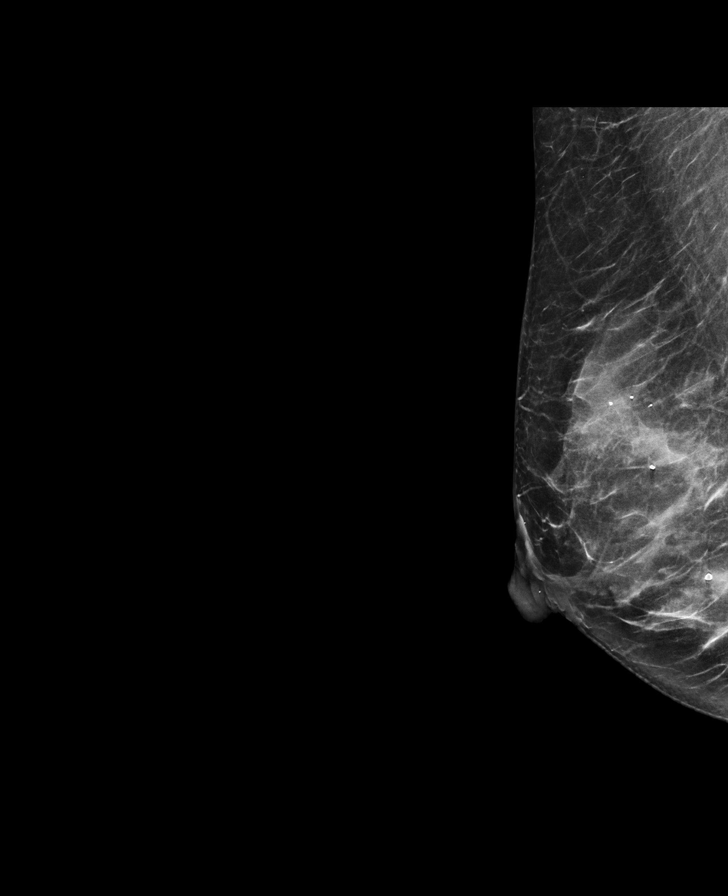

[L CC synth-2D]
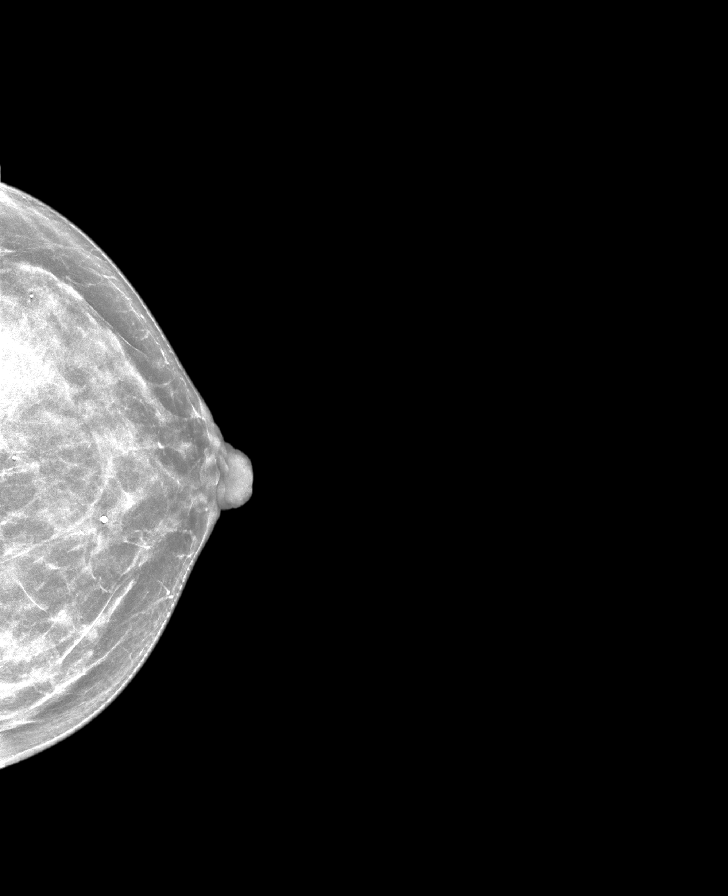

[L MLO tomo · tomo slice 31/61.0]
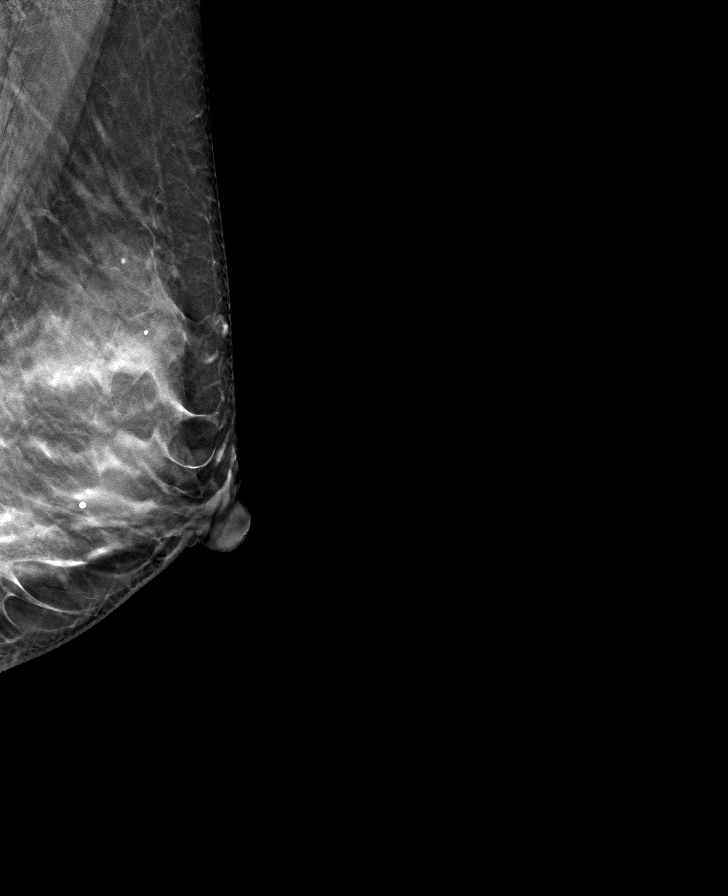

[R CC tomo · tomo slice 31/60.0]
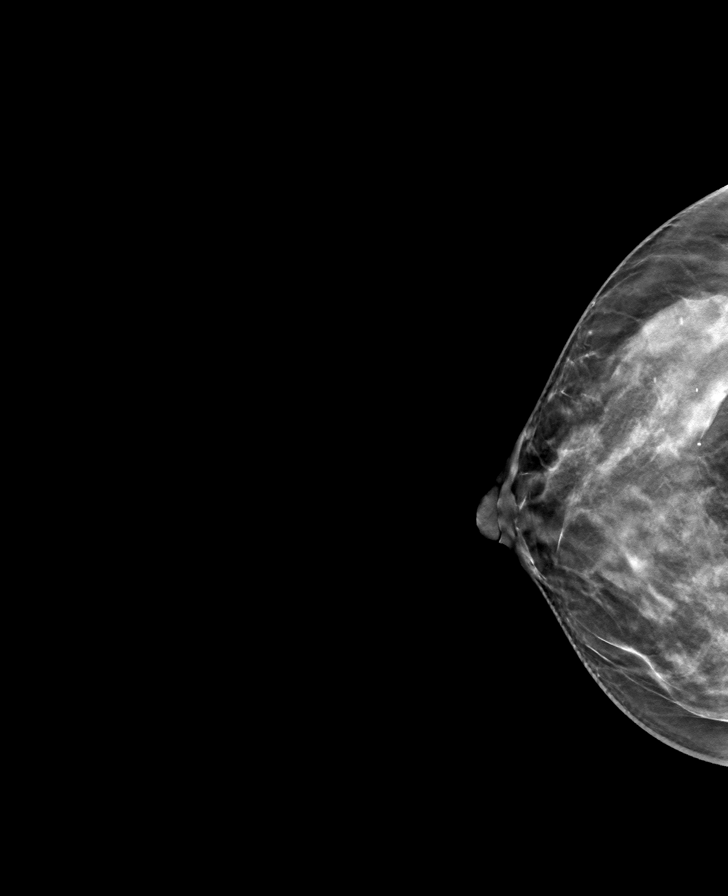

[L CC tomo · tomo slice 29/58.0]
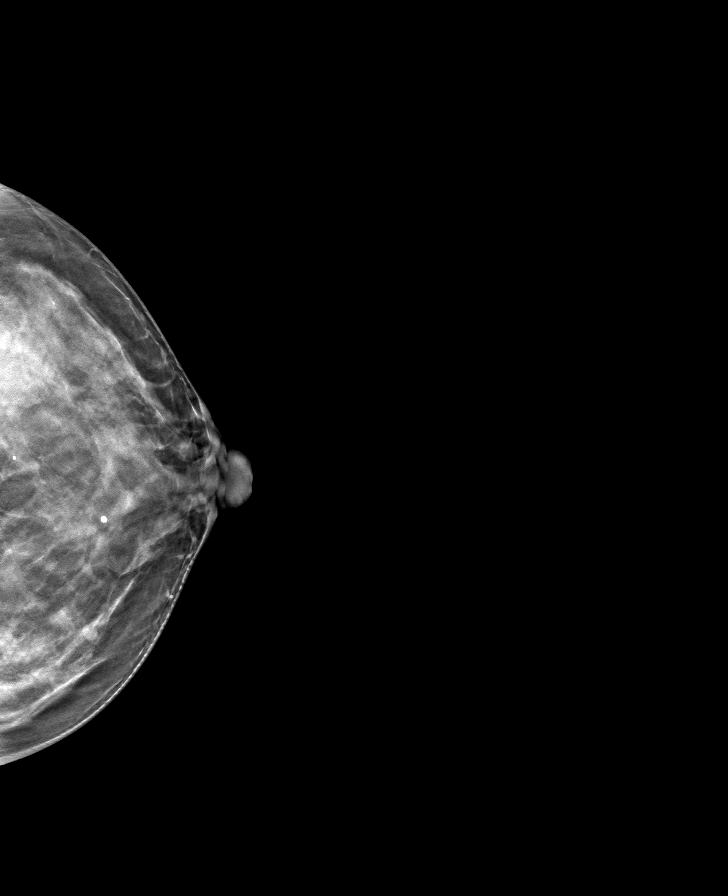

[R MLO tomo · tomo slice 33/64.0]
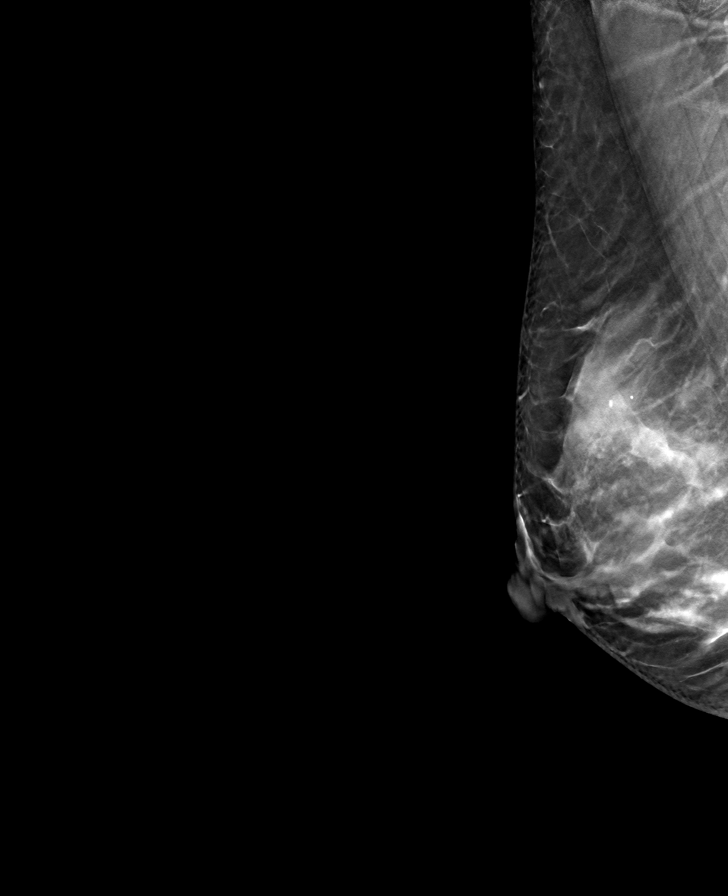

[8 of 24 positions shown; findings below may reference images not displayed]

ACR Breast Density Category d: The breast tissue is extremely dense,
which lowers the sensitivity of mammography.
FINDINGS: In the right breast, a possible asymmetry warrants further
evaluation. In the left breast, no findings suspicious for
malignancy. Images were processed with CAD.
IMPRESSION: Further evaluation is suggested for possible asymmetry in the right
breast.

RECOMMENDATION:
Diagnostic mammogram and possibly ultrasound of the right breast.
(Code:[id number])

The patient will be contacted regarding the findings, and additional
imaging will be scheduled.

BI-RADS CATEGORY  0: Incomplete. Need additional imaging evaluation
and/or prior mammograms for comparison.

## 2019-02-23 ENCOUNTER — Ambulatory Visit
Admission: RE | Admit: 2019-02-23 | Discharge: 2019-02-23 | Disposition: A | Discharge status: Home / Self Care | Payer: 59 | Source: Ambulatory Visit | Attending: Family Medicine | Admitting: Family Medicine

## 2019-02-23 ENCOUNTER — Other Ambulatory Visit: Payer: Self-pay

## 2019-02-23 DIAGNOSIS — R7989 Other specified abnormal findings of blood chemistry: Secondary | ICD-10-CM

## 2019-02-23 DIAGNOSIS — Z1231 Encounter for screening mammogram for malignant neoplasm of breast: Secondary | ICD-10-CM

## 2019-02-23 NOTE — Addendum Note (Signed)
Addended by: Javen Hinderliter C on: 02/23/2019 10:02 AM   Modules accepted: Orders  

## 2019-02-23 NOTE — Addendum Note (Signed)
Addended by: Francis Gaines C on: 02/23/2019 10:02 AM   Modules accepted: Orders

## 2019-03-04 LAB — ALT: ALT: 15 IU/L (ref 0–32)

## 2019-03-04 LAB — AST: AST: 21 IU/L (ref 0–40)

## 2019-03-04 NOTE — Progress Notes (Signed)
AST and ALT are normal.

## 2019-03-10 ENCOUNTER — Ambulatory Visit (INDEPENDENT_AMBULATORY_CARE_PROVIDER_SITE_OTHER): Payer: 59

## 2019-03-10 ENCOUNTER — Other Ambulatory Visit: Payer: Self-pay

## 2019-03-10 ENCOUNTER — Ambulatory Visit (INDEPENDENT_AMBULATORY_CARE_PROVIDER_SITE_OTHER): Payer: 59 | Admitting: Podiatry

## 2019-03-10 DIAGNOSIS — M7741 Metatarsalgia, right foot: Secondary | ICD-10-CM | POA: Diagnosis not present

## 2019-03-10 DIAGNOSIS — M7742 Metatarsalgia, left foot: Secondary | ICD-10-CM

## 2019-03-10 DIAGNOSIS — M79672 Pain in left foot: Secondary | ICD-10-CM

## 2019-03-11 ENCOUNTER — Telehealth: Payer: Self-pay | Admitting: *Deleted

## 2019-03-11 ENCOUNTER — Telehealth: Payer: Self-pay | Admitting: Rheumatology

## 2019-03-11 DIAGNOSIS — M79672 Pain in left foot: Secondary | ICD-10-CM

## 2019-03-11 DIAGNOSIS — M7742 Metatarsalgia, left foot: Secondary | ICD-10-CM

## 2019-03-11 DIAGNOSIS — M7741 Metatarsalgia, right foot: Secondary | ICD-10-CM

## 2019-03-11 MED ORDER — HUMIRA (2 PEN) 40 MG/0.4ML ~~LOC~~ AJKT: 40.0000 mg | AUTO-INJECTOR | SUBCUTANEOUS | 0 refills | Status: DC

## 2019-03-11 NOTE — Telephone Encounter (Signed)
Vickie [name] from [facility] left a voicemail requesting prescription refill for the patient.  Phone #[phone]

## 2019-03-11 NOTE — Addendum Note (Signed)
Addended by: Alphia Kava D on: 03/11/2019 09:20 AM   Modules accepted: Orders

## 2019-03-11 NOTE — Telephone Encounter (Signed)
Contacted the pharmacy. Patient needs refill on Humira.   Last Visit: 01/07/19 Next Visit: 04/08/19 Labs: 01/07/19 ALT is borderline elevated-33. WBC count is low but stable. Rest of CBC WNL.  TB Gold: 01/07/19 neg    Okay to refill per Dr. Corliss Skains

## 2019-03-11 NOTE — Telephone Encounter (Signed)
-----   Message from Felecia Shelling, DPM sent at 03/10/2019  3:44 PM EST ----- Regarding: MRI left foot Please order MRI left forefoot with or w/out contrast.   Dx: soft tissue mass left sub metatarsals  Thanks, Dr. Logan Bores

## 2019-03-11 NOTE — Telephone Encounter (Signed)
Orders to Dr. Logan Bores assistant for pre-cert, faxed to [facility].

## 2019-03-12 ENCOUNTER — Other Ambulatory Visit: Payer: Self-pay | Admitting: Podiatry

## 2019-03-12 DIAGNOSIS — M7741 Metatarsalgia, right foot: Secondary | ICD-10-CM

## 2019-03-12 DIAGNOSIS — M7742 Metatarsalgia, left foot: Secondary | ICD-10-CM

## 2019-03-14 NOTE — Progress Notes (Signed)
   HPI: 54 y.o. female otherwise healthy and very active presents today for evaluation of calluses to the plantar feet.  Patient states that she has significant pain to the balls of her feet bilaterally where the calluses are.  Left greater than the right.  This is been ongoing for several months now.  Patient states she has not been able to get pedicures this year due to Covid and she has not experienced increased pain to this area.  Past Medical History:  Diagnosis Date  . Arthritis    RA Dx at age 62  . Bell's palsy   . Chicken pox   . Hypertension   . PVC (premature ventricular contraction) 01/02/2017  . Snoring 01/02/2017     Physical Exam: General: The patient is alert and oriented x3 in no acute distress.  Dermatology: Skin is warm, dry and supple bilateral lower extremities. Negative for open lesions or macerations.  Hyperkeratotic callus is noted to the sub-MTPJ's bilateral feet  Vascular: Palpable pedal pulses bilaterally. No edema or erythema noted. Capillary refill within normal limits.  Neurological: Epicritic and protective threshold grossly intact bilaterally.   Musculoskeletal Exam: Range of motion within normal limits to all pedal and ankle joints bilateral. Muscle strength 5/5 in all groups bilateral.  Large palpable areas noted to the submetatarsal regions of the bilateral feet.  This is not simple callus as it is an increase in size of the soft tissue possibly due to bursa to the submetatarsal regions.  Radiographic Exam:  Normal osseous mineralization. Joint spaces preserved. No fracture/dislocation/boney destruction.    Assessment: 1.  Metatarsalgia bilateral left greater than right 2.  Soft tissue mass submetatarsals bilateral left greater than right   Plan of Care:  1. Patient evaluated. X-Rays reviewed.  2.  Due to the increased size of the soft tissue masses to the submetatarsal areas that have increased in size recently over the last few months and can  order an MRI to the left forefoot since this is the more symptomatic foot 3.  Continue wearing good supportive shoes that are soft to the forefoot 4.  Return to clinic in 3 weeks to review MRI results and possible surgical consult  *Engineer, structural at [employer]      Felecia Shelling, DPM [facility]  Dr. Felecia Shelling, DPM    [address] [place], [place] [address]                Office [phone]  Fax [phone]

## 2019-03-19 ENCOUNTER — Telehealth: Payer: Self-pay | Admitting: *Deleted

## 2019-03-19 NOTE — Telephone Encounter (Signed)
Precertification completed on 01/22/2021pending until Records uploaded or faxed.  Records/office notes faxed 01/22/[phone]. Spoke with Jasmine C, Ref. # [id number] to the left forefoot.

## 2019-03-31 ENCOUNTER — Ambulatory Visit: Payer: 59 | Admitting: Podiatry

## 2019-04-01 ENCOUNTER — Telehealth: Payer: Self-pay | Admitting: Podiatry

## 2019-04-01 NOTE — Telephone Encounter (Signed)
I'm scheduled for an MRI on 04/10/19. I received a letter from [facility] letting me know that MRI scheduled will not be covered because they don't deem it medically necessary. If you could please call me back at [phone]. Thank you.

## 2019-04-01 NOTE — Telephone Encounter (Signed)
Sent the message to [place]. Misty Stanley

## 2019-04-02 ENCOUNTER — Telehealth: Payer: Self-pay | Admitting: *Deleted

## 2019-04-02 NOTE — Telephone Encounter (Signed)
Patient has received a letter from [facility], very concerned. The letter states that her MRI is not medically necessary and the reason is that records show that it is not detailed enough.  Please call [phone]), is still in a lot of pain and can't walk.

## 2019-04-05 NOTE — Telephone Encounter (Signed)
[place] patient.  

## 2019-04-05 NOTE — Progress Notes (Signed)
Office Visit Note  Patient: Vickie [name]             Date of Birth: 06-10-1965           MRN: [id number]             PCP: Martinique, Betty G, MD Referring: [place], Betty G, MD Visit Date: 04/08/2019 Occupation: '@GUAROCC' @  Subjective:  Pain in both feet   History of Present Illness: Vickie [name] is a 54 y.o. female with history of seropositive rheumatoid arthritis.  Patient is on Humira 40 mg subcu injections every 14 days, Rasuvo 20 mg subcutaneous injections once weekly, and folic acid 2 mg by mouth daily.  She has not missed any doses of Humira stable recently.  She reports that she continues to have severe pain in bilateral feet.  She states that she had a consultation with Dr. Amalia Hailey who recommended proceeding with MRI of the left forefoot.  According to the patient her MRI was denied and she has not heard back from [facility].  She will be establishing care with Dr. Doran Durand for further evaluation.   Activities of Daily Living:  Patient reports morning stiffness for 0 none.   Patient Reports nocturnal pain.  Difficulty dressing/grooming: Denies Difficulty climbing stairs: Reports Difficulty getting out of chair: Denies Difficulty using hands for taps, buttons, cutlery, and/or writing: Denies  Review of Systems  Constitutional: Positive for fatigue.  HENT: Negative for mouth sores, mouth dryness and nose dryness.   Eyes: Negative for pain, visual disturbance and dryness.  Respiratory: Negative for cough, hemoptysis, shortness of breath and difficulty breathing.   Cardiovascular: Negative for chest pain, palpitations, hypertension and swelling in legs/feet.  Gastrointestinal: Negative for blood in stool, constipation and diarrhea.  Endocrine: Negative for increased urination.  Genitourinary: Negative for painful urination.  Musculoskeletal: Positive for arthralgias and joint pain. Negative for joint swelling, myalgias, muscle weakness, morning stiffness, muscle  tenderness and myalgias.  Skin: Negative for color change, pallor, rash, hair loss, nodules/bumps, skin tightness, ulcers and sensitivity to sunlight.  Allergic/Immunologic: Negative for susceptible to infections.  Neurological: Negative for dizziness, numbness, headaches and weakness.  Hematological: Negative for bruising/bleeding tendency and swollen glands.  Psychiatric/Behavioral: Positive for sleep disturbance. Negative for depressed mood. The patient is not nervous/anxious.     PMFS History:  Patient Active Problem List   Diagnosis Date Noted  . PVC (premature ventricular contraction) 01/02/2017  . Snoring 01/02/2017  . High risk medication use 10/24/2016  . Rheumatoid nodule, left elbow ([place]) 10/24/2016  . Rheumatoid nodule, right elbow ([place]) 10/24/2016  . Calcaneal spur of both feet 10/24/2016  . Dry mouth 10/24/2016  . Rheumatoid arthritis ([place]) 08/07/2016  . Hypertension, essential 08/07/2016    Past Medical History:  Diagnosis Date  . Arthritis    RA Dx at age 9  . Bell's palsy   . Chicken pox   . Hypertension   . PVC (premature ventricular contraction) 01/02/2017  . Rheumatoid arthritis ([place])   . Snoring 01/02/2017    Family History  Problem Relation Age of Onset  . Rheum arthritis Mother   . Diabetes Neg Hx   . Hypertension Neg Hx   . Hyperlipidemia Neg Hx   . Stroke Neg Hx   . Colon cancer Neg Hx    Past Surgical History:  Procedure Laterality Date  . CESAREAN SECTION    . FOOT SURGERY     Social History   Social History Narrative  .  Not on file   Immunization History  Administered Date(s) Administered  . Influenza Inj Mdck Quad Pf 11/28/2016  . Influenza,inj,Quad PF,6+ Mos 12/05/2015, 12/19/2017  . Tdap 09/26/2014     Objective: Vital Signs: BP 114/79 (BP Location: Left Arm, Patient Position: Sitting, Cuff Size: Normal)   Pulse 95   Resp 14   Ht '5\' 2"'  (1.575 m)   Wt 128 lb (58.1 kg)   BMI 23.41 kg/m    Physical Exam Vitals and nursing  note reviewed.  Constitutional:      Appearance: She is well-developed.  HENT:     Head: Normocephalic and atraumatic.  Eyes:     Conjunctiva/sclera: Conjunctivae normal.  Cardiovascular:     Rate and Rhythm: Normal rate and regular rhythm.     Heart sounds: Normal heart sounds.  Pulmonary:     Effort: Pulmonary effort is normal.     Breath sounds: Normal breath sounds.  Abdominal:     General: Bowel sounds are normal.     Palpations: Abdomen is soft.  Musculoskeletal:     Cervical back: Normal range of motion.  Lymphadenopathy:     Cervical: No cervical adenopathy.  Skin:    General: Skin is warm and dry.     Capillary Refill: Capillary refill takes less than 2 seconds.  Neurological:     Mental Status: She is alert and oriented to person, place, and time.  Psychiatric:        Behavior: Behavior normal.      Musculoskeletal Exam: C-spine, thoracic spine, and lumbar spine good ROM.  No midline spinal tenderness.  No SI joint tenderness.  Shoulder joints, elbow joints, MCPs, PIPs, and DIPs good ROM with no synovitis.  Limited ROM of both wrist joints. Ulnar deviation bilaterally.  Synovial thickening of MCP joints.  Hip joints good ROM with no discomfort. Knee joints good ROM with no warmth or effusion.  Ankle joints good ROM with no tenderness or inflammation.  Tenderness of all MTP joints.  Soft tissue mass submetatarsal bilateral left greater than right.   CDAI Exam: CDAI Score: 0.4  Patient Global: 2 mm; Provider Global: 2 mm Swollen: 0 ; Tender: 0  Joint Exam 04/08/2019   No joint exam has been documented for this visit   There is currently no information documented on the homunculus. Go to the Rheumatology activity and complete the homunculus joint exam.  Investigation: No additional findings.  Imaging: DG Foot Complete Left  Result Date: 03/12/2019 Please see detailed radiograph report in office note.   Recent Labs: Lab Results  Component Value Date   WBC  3.6 (L) 01/07/2019   HGB 14.1 01/07/2019   PLT 335 01/07/2019   NA 141 01/07/2019   K 4.0 01/07/2019   CL 105 01/07/2019   CO2 26 01/07/2019   GLUCOSE 88 01/07/2019   BUN 14 01/07/2019   CREATININE 0.62 01/07/2019   BILITOT 0.8 01/07/2019   ALKPHOS 79 01/02/2017   AST 21 03/03/2019   ALT 15 03/03/2019   PROT 7.7 01/07/2019   ALBUMIN 4.3 01/02/2017   CALCIUM 9.7 01/07/2019   GFRAA 118 01/07/2019   QFTBGOLDPLUS NEGATIVE 01/07/2019    Speciality Comments: No specialty comments available.  Procedures:  No procedures performed Allergies: Patient has no known allergies.   Assessment / Plan:     Visit Diagnoses: Rheumatoid arthritis involving multiple sites with positive rheumatoid factor (HCC) Positive RF, positive anti-CCP, elevated ESR, erosive disease with nodulosis: Has no synovitis on exam.  She  denied any recent rheumatoid arthritis flares.  She has been experiencing increased pain in both feet for the past 4 months.  She has tenderness across all MTP joints and soft tissue masses to the submetatarsal areas.  Hammertoes noted.  She was evaluated by Dr. Amalia Hailey at [facility] who recommended proceeding with an MRI of the left foot.  According to the patient the MRI was denied but has not heard back from Dr. Phoebe Perch office.  She will be establishing care with [facility] at [facility] for further evaluation. She is not experiencing any other joint pain or inflammation at this time.  She has ulnar deviation and synovial thickening of all MCP joints on exam.  She has limited range of motion of both wrist joints.  Her rheumatoid arthritis is clinically stable on the current regimen of Humira 40 mg every days injections every 14 days, Rasuvo 20 mg subcu injections once weekly, folic acid 2 mg by mouth daily.  Refill of Rasuvo will be sent to the pharmacy today.  She will continue on the current treatment regimen.  She was advised to notify us if she develops increased joint pain  or joint swelling.  She will follow-up in the office in 3 months.  High risk medication use - Humira 40 mg every 14 days, Rasuvo 20 mg every 7 days, and folic acid 2 mg daily.  CBC and CMP were drawn on 01/07/2019.  TB gold negative on 01/07/2019.  LFTs returned to within normal limits on 03/03/2019.  We will recheck CBC and CMP today.  She will return for lab work in May and every 3 months to monitor for drug toxicity.  Standing orders were placed today.- Plan: CBC with Differential/Platelet, COMPLETE METABOLIC PANEL WITH GFR  Elevated LFTs: LFTs WNL on 03/03/19.    Metatarsalgia of both feet: She has been experiencing pain in both feet for the past 4 months. she is able to bear weight fully bilaterally.  She has soft tissue masses to the submetatarsal areas.  Tenderness of all MTP joints but no synovitis was noted.  She has tried wearing metatarsal pads and arch support without much relief.  Dr. Amalia Hailey ordered an MRI of the left foot which was denied by her insurance.  He was planning on performing a peer to peer but the patient will be establishing care with Dr. Doran Durand at [facility]. We discussed the importance of wearing proper fitting shoes.   Acute pain of right knee: She has been experiencing nocturnal pain in the right knee joint intermittently.  She has no mechanical symptoms at this time.  She has not had any daytime symptoms and continues to be able to ballroom dance for exercise.  She is good range of motion of the right knee joint on exam.  No warmth or effusion was noted.  She declined x-rays at this time.  The use of Voltaren gel was discussed.  She is advised to notify us if the pain persists or worsens.  Rheumatoid nodule, right elbow [facility]): Resolved   Rheumatoid nodule, left elbow [facility]): Small nodule present.   Trigger little finger of left hand: Resolved.   Calcaneal spur of both feet: She has chronic pain in both feet. She will be establishing care with Dr. Doran Durand at  [facility]  Trochanteric bursitis, left hip: Resolved   History of hypertension     Orders: Orders Placed This Encounter  Procedures  . CBC with Differential/Platelet  . COMPLETE METABOLIC PANEL WITH GFR  No orders of the defined types were placed in this encounter.     Follow-Up Instructions: Return in about 3 months (around 07/06/2019) for Rheumatoid arthritis.   Vickie Neas, PA-C   I examined and evaluated the patient with Vickie Sams PA.  Patient has severe end-stage rheumatoid arthritis with joint deformities and synovial thickening but no active synovitis was noted.  She continues to have a lot of pain and discomfort in her feet.  She had calluses in her feet.  She has appointment coming up with Dr. Doran Durand.  She plans to get a MRI to look for synovitis otherwise she may benefit from orthotics.  The plan of care was discussed as noted above.  Vickie Merino, MD  Note - This record has been created using Editor, commissioning.  Chart creation errors have been sought, but may not always  have been located. Such creation errors do not reflect on  the standard of medical care.

## 2019-04-06 ENCOUNTER — Telehealth: Payer: Self-pay | Admitting: *Deleted

## 2019-04-06 ENCOUNTER — Telehealth: Payer: Self-pay | Admitting: Podiatry

## 2019-04-06 NOTE — Telephone Encounter (Signed)
Patient is scheduled for MRI on Sat. But is stating that her insurance has not been approved for lack of data received. Please call.

## 2019-04-06 NOTE — Telephone Encounter (Signed)
Pt called upset because the MRI is not approved and she is scheduled to have it done this Saturday. She stated she has called several times and not gotten a follow up call. She is stating if we cannot get this taken care of she may just go somewhere else. She needs a follow up to fiqure out what is wrong with her foot.

## 2019-04-06 NOTE — Telephone Encounter (Signed)
[facility]) is wanting to know if a Peer to Peer should be completed for this patient since her coverage has been denied by her health coverage([employer])? Please call at [phone], ext. [phone]

## 2019-04-07 NOTE — Telephone Encounter (Signed)
Pt called back to follow up on authorization for her MRI scheduled for Saturday.   Pt states she is upset by the delays and if she does not hear back from our office by Friday she will be cancelling the MRI and all future appts with our office.

## 2019-04-07 NOTE — Telephone Encounter (Signed)
Called this morning for Peer-to-peer and got sent to voicemail. Left a message. I'll try again today at lunchtime.  - Dr. Logan Bores

## 2019-04-08 ENCOUNTER — Telehealth: Payer: Self-pay | Admitting: Podiatry

## 2019-04-08 ENCOUNTER — Encounter: Payer: Self-pay | Admitting: Rheumatology

## 2019-04-08 ENCOUNTER — Telehealth: Payer: Self-pay

## 2019-04-08 ENCOUNTER — Other Ambulatory Visit: Payer: Self-pay

## 2019-04-08 ENCOUNTER — Ambulatory Visit (INDEPENDENT_AMBULATORY_CARE_PROVIDER_SITE_OTHER): Payer: 59 | Admitting: Rheumatology

## 2019-04-08 VITALS — BP 114/79 | HR 95 | Resp 14 | Ht 62.0 in | Wt 128.0 lb

## 2019-04-08 DIAGNOSIS — M65352 Trigger finger, left little finger: Secondary | ICD-10-CM

## 2019-04-08 DIAGNOSIS — M0579 Rheumatoid arthritis with rheumatoid factor of multiple sites without organ or systems involvement: Secondary | ICD-10-CM

## 2019-04-08 DIAGNOSIS — M7732 Calcaneal spur, left foot: Secondary | ICD-10-CM

## 2019-04-08 DIAGNOSIS — M25561 Pain in right knee: Secondary | ICD-10-CM

## 2019-04-08 DIAGNOSIS — Z79899 Other long term (current) drug therapy: Secondary | ICD-10-CM | POA: Diagnosis not present

## 2019-04-08 DIAGNOSIS — M7731 Calcaneal spur, right foot: Secondary | ICD-10-CM

## 2019-04-08 DIAGNOSIS — M06322 Rheumatoid nodule, left elbow: Secondary | ICD-10-CM | POA: Diagnosis not present

## 2019-04-08 DIAGNOSIS — M06321 Rheumatoid nodule, right elbow: Secondary | ICD-10-CM

## 2019-04-08 DIAGNOSIS — Z8679 Personal history of other diseases of the circulatory system: Secondary | ICD-10-CM

## 2019-04-08 DIAGNOSIS — M7741 Metatarsalgia, right foot: Secondary | ICD-10-CM

## 2019-04-08 DIAGNOSIS — R7989 Other specified abnormal findings of blood chemistry: Secondary | ICD-10-CM

## 2019-04-08 DIAGNOSIS — M7742 Metatarsalgia, left foot: Secondary | ICD-10-CM

## 2019-04-08 DIAGNOSIS — M7062 Trochanteric bursitis, left hip: Secondary | ICD-10-CM

## 2019-04-08 LAB — CBC WITH DIFFERENTIAL/PLATELET
Absolute Monocytes: 320 cells/uL (ref 200–950)
Basophils Absolute: 8 cells/uL (ref 0–200)
Basophils Relative: 0.2 %
Eosinophils Absolute: 29 cells/uL (ref 15–500)
Eosinophils Relative: 0.7 %
HCT: 44.6 % (ref 35.0–45.0)
Hemoglobin: 14.9 g/dL (ref 11.7–15.5)
Lymphs Abs: 1316 cells/uL (ref 850–3900)
MCH: 30.3 pg (ref 27.0–33.0)
MCHC: 33.4 g/dL (ref 32.0–36.0)
MCV: 90.8 fL (ref 80.0–100.0)
MPV: 9.9 fL (ref 7.5–12.5)
Monocytes Relative: 7.8 %
Neutro Abs: 2427 cells/uL (ref 1500–7800)
Neutrophils Relative %: 59.2 %
Platelets: 333 10*3/uL (ref 140–400)
RBC: 4.91 10*6/uL (ref 3.80–5.10)
RDW: 12.8 % (ref 11.0–15.0)
Total Lymphocyte: 32.1 %
WBC: 4.1 10*3/uL (ref 3.8–10.8)

## 2019-04-08 LAB — COMPLETE METABOLIC PANEL WITH GFR
AG Ratio: 1.2 (calc) (ref 1.0–2.5)
ALT: 22 U/L (ref 6–29)
AST: 31 U/L (ref 10–35)
Albumin: 4.3 g/dL (ref 3.6–5.1)
Alkaline phosphatase (APISO): 83 U/L (ref 37–153)
BUN: 13 mg/dL (ref 7–25)
CO2: 25 mmol/L (ref 20–32)
Calcium: 9.6 mg/dL (ref 8.6–10.4)
Chloride: 106 mmol/L (ref 98–110)
Creat: 0.56 mg/dL (ref 0.50–1.05)
GFR, Est African American: 123 mL/min/{1.73_m2} (ref >=60)
GFR, Est Non African American: 106 mL/min/{1.73_m2} (ref >=60)
Globulin: 3.7 g/dL (calc) (ref 1.9–3.7)
Glucose, Bld: 95 mg/dL (ref 65–99)
Potassium: 4.1 mmol/L (ref 3.5–5.3)
Sodium: 140 mmol/L (ref 135–146)
Total Bilirubin: 0.9 mg/dL (ref 0.2–1.2)
Total Protein: 8 g/dL (ref 6.1–8.1)

## 2019-04-08 MED ORDER — RASUVO 20 MG/0.4ML ~~LOC~~ SOAJ: SUBCUTANEOUS | 0 refills | Status: DC

## 2019-04-08 NOTE — Telephone Encounter (Signed)
Melton Alar from [facility] left a message this morning, inquiring about the status of the MRI peer to peer authorization. MRI has been postponed until we can obtain authorization

## 2019-04-08 NOTE — Telephone Encounter (Signed)
Pt called and canceled all her appts and is going somewhere else. She is not happy with our customer service and is thinks she should not have to pay the current bills she owes. Due to lack of customer service and lack of caring. She was very upset that she was scheduled for a mri Saturday and it was denied and she never heard anything from our office and has called several times. Dr Logan Bores did a peer to peer yesterday and as of yesterday it was pending.

## 2019-04-08 NOTE — Patient Instructions (Signed)
Journal for Nurse Practitioners, 15(4), 263-267. Retrieved December 01, 2017 from http://clinicalkey.com/nursing">  Knee Exercises Ask your health care provider which exercises are safe for you. Do exercises exactly as told by your health care provider and adjust them as directed. It is normal to feel mild stretching, pulling, tightness, or discomfort as you do these exercises. Stop right away if you feel sudden pain or your pain gets worse. Do not begin these exercises until told by your health care provider. Stretching and range-of-motion exercises These exercises warm up your muscles and joints and improve the movement and flexibility of your knee. These exercises also help to relieve pain and swelling. Knee extension, prone 1. Lie on your abdomen (prone position) on a bed. 2. Place your left / right knee just beyond the edge of the surface so your knee is not on the bed. You can put a towel under your left / right thigh just above your kneecap for comfort. 3. Relax your leg muscles and allow gravity to straighten your knee (extension). You should feel a stretch behind your left / right knee. 4. Hold this position for __________ seconds. 5. Scoot up so your knee is supported between repetitions. Repeat __________ times. Complete this exercise __________ times a day. Knee flexion, active  1. Lie on your back with both legs straight. If this causes back discomfort, bend your left / right knee so your foot is flat on the floor. 2. Slowly slide your left / right heel back toward your buttocks. Stop when you feel a gentle stretch in the front of your knee or thigh (flexion). 3. Hold this position for __________ seconds. 4. Slowly slide your left / right heel back to the starting position. Repeat __________ times. Complete this exercise __________ times a day. Quadriceps stretch, prone  1. Lie on your abdomen on a firm surface, such as a bed or padded floor. 2. Bend your left / right knee and hold  your ankle. If you cannot reach your ankle or pant leg, loop a belt around your foot and grab the belt instead. 3. Gently pull your heel toward your buttocks. Your knee should not slide out to the side. You should feel a stretch in the front of your thigh and knee (quadriceps). 4. Hold this position for __________ seconds. Repeat __________ times. Complete this exercise __________ times a day. Hamstring, supine 1. Lie on your back (supine position). 2. Loop a belt or towel over the ball of your left / right foot. The ball of your foot is on the walking surface, right under your toes. 3. Straighten your left / right knee and slowly pull on the belt to raise your leg until you feel a gentle stretch behind your knee (hamstring). ? Do not let your knee bend while you do this. ? Keep your other leg flat on the floor. 4. Hold this position for __________ seconds. Repeat __________ times. Complete this exercise __________ times a day. Strengthening exercises These exercises build strength and endurance in your knee. Endurance is the ability to use your muscles for a long time, even after they get tired. Quadriceps, isometric This exercise stretches the muscles in front of your thigh (quadriceps) without moving your knee joint (isometric). 1. Lie on your back with your left / right leg extended and your other knee bent. Put a rolled towel or small pillow under your knee if told by your health care provider. 2. Slowly tense the muscles in the front of your left /   right thigh. You should see your kneecap slide up toward your hip or see increased dimpling just above the knee. This motion will push the back of the knee toward the floor. 3. For __________ seconds, hold the muscle as tight as you can without increasing your pain. 4. Relax the muscles slowly and completely. Repeat __________ times. Complete this exercise __________ times a day. Straight leg raises This exercise stretches the muscles in front  of your thigh (quadriceps) and the muscles that move your hips (hip flexors). 1. Lie on your back with your left / right leg extended and your other knee bent. 2. Tense the muscles in the front of your left / right thigh. You should see your kneecap slide up or see increased dimpling just above the knee. Your thigh may even shake a bit. 3. Keep these muscles tight as you raise your leg 4-6 inches (10-15 cm) off the floor. Do not let your knee bend. 4. Hold this position for __________ seconds. 5. Keep these muscles tense as you lower your leg. 6. Relax your muscles slowly and completely after each repetition. Repeat __________ times. Complete this exercise __________ times a day. Hamstring, isometric 1. Lie on your back on a firm surface. 2. Bend your left / right knee about __________ degrees. 3. Dig your left / right heel into the surface as if you are trying to pull it toward your buttocks. Tighten the muscles in the back of your thighs (hamstring) to "dig" as hard as you can without increasing any pain. 4. Hold this position for __________ seconds. 5. Release the tension gradually and allow your muscles to relax completely for __________ seconds after each repetition. Repeat __________ times. Complete this exercise __________ times a day. Hamstring curls If told by your health care provider, do this exercise while wearing ankle weights. Begin with __________ lb weights. Then increase the weight by 1 lb (0.5 kg) increments. Do not wear ankle weights that are more than __________ lb. 1. Lie on your abdomen with your legs straight. 2. Bend your left / right knee as far as you can without feeling pain. Keep your hips flat against the floor. 3. Hold this position for __________ seconds. 4. Slowly lower your leg to the starting position. Repeat __________ times. Complete this exercise __________ times a day. Squats This exercise strengthens the muscles in front of your thigh and knee  (quadriceps). 1. Stand in front of a table, with your feet and knees pointing straight ahead. You may rest your hands on the table for balance but not for support. 2. Slowly bend your knees and lower your hips like you are going to sit in a chair. ? Keep your weight over your heels, not over your toes. ? Keep your lower legs upright so they are parallel with the table legs. ? Do not let your hips go lower than your knees. ? Do not bend lower than told by your health care provider. ? If your knee pain increases, do not bend as low. 3. Hold the squat position for __________ seconds. 4. Slowly push with your legs to return to standing. Do not use your hands to pull yourself to standing. Repeat __________ times. Complete this exercise __________ times a day. Wall slides This exercise strengthens the muscles in front of your thigh and knee (quadriceps). 1. Lean your back against a smooth wall or door, and walk your feet out 18-24 inches (46-61 cm) from it. 2. Place your feet hip-width apart. 3.   Slowly slide down the wall or door until your knees bend __________ degrees. Keep your knees over your heels, not over your toes. Keep your knees in line with your hips. 4. Hold this position for __________ seconds. Repeat __________ times. Complete this exercise __________ times a day. Straight leg raises This exercise strengthens the muscles that rotate the leg at the hip and move it away from your body (hip abductors). 1. Lie on your side with your left / right leg in the top position. Lie so your head, shoulder, knee, and hip line up. You may bend your bottom knee to help you keep your balance. 2. Roll your hips slightly forward so your hips are stacked directly over each other and your left / right knee is facing forward. 3. Leading with your heel, lift your top leg 4-6 inches (10-15 cm). You should feel the muscles in your outer hip lifting. ? Do not let your foot drift forward. ? Do not let your knee  roll toward the ceiling. 4. Hold this position for __________ seconds. 5. Slowly return your leg to the starting position. 6. Let your muscles relax completely after each repetition. Repeat __________ times. Complete this exercise __________ times a day. Straight leg raises This exercise stretches the muscles that move your hips away from the front of the pelvis (hip extensors). 1. Lie on your abdomen on a firm surface. You can put a pillow under your hips if that is more comfortable. 2. Tense the muscles in your buttocks and lift your left / right leg about 4-6 inches (10-15 cm). Keep your knee straight as you lift your leg. 3. Hold this position for __________ seconds. 4. Slowly lower your leg to the starting position. 5. Let your leg relax completely after each repetition. Repeat __________ times. Complete this exercise __________ times a day. This information is not intended to replace advice given to you by your health care provider. Make sure you discuss any questions you have with your health care provider. Document Revised: 12/02/2017 Document Reviewed: 12/02/2017 Elsevier Patient Education  2020 [employer] Inc.  

## 2019-04-08 NOTE — Addendum Note (Signed)
Addended by: Ellen Henri on: 04/08/2019 01:37 PM   Modules accepted: Orders

## 2019-04-08 NOTE — Telephone Encounter (Signed)
[place] peer-to-peer is down at the moment. They said please call back in an hour to set up a peer-to-peer appt. Phone # [phone] option 1. Please make appt for today anytime after 2pm. - Thanks, Dr. Logan Bores

## 2019-04-09 NOTE — Progress Notes (Signed)
CBC and CMP WNL

## 2019-04-10 ENCOUNTER — Other Ambulatory Visit: Payer: 59

## 2019-04-14 ENCOUNTER — Ambulatory Visit: Payer: 59 | Admitting: Podiatry

## 2019-04-19 ENCOUNTER — Ambulatory Visit: Payer: 59 | Admitting: Podiatry

## 2019-04-21 ENCOUNTER — Telehealth: Payer: Self-pay | Admitting: Rheumatology

## 2019-04-21 NOTE — Telephone Encounter (Signed)
Patient advised we have received the medical clearance and have faxed the completed form to Dr. Victorino Dike. Reviewed information with patient as how to hold medication for surgery.

## 2019-04-21 NOTE — Telephone Encounter (Signed)
Dr. Victorino Dike with [facility] was to send over a release for surgery on patient. Patient needs to have foot surgery, and was checking to see if we had received medical release request. Please call to advise.

## 2019-04-26 HISTORY — PX: FOOT SURGERY: SHX648

## 2019-05-04 ENCOUNTER — Telehealth: Payer: Self-pay | Admitting: *Deleted

## 2019-05-04 MED ORDER — PREDNISONE 5 MG PO TABS: 5.0000 mg | ORAL_TABLET | Freq: Every day | ORAL | 0 refills | Status: DC

## 2019-05-04 NOTE — Telephone Encounter (Signed)
Patient's husband Onalee Hua called on her behalf. Patient had foot surgery today. Onalee Hua states that the patient has been off her medication in preparation for her surgery. Patient is on Humira and Methotrexate. Patient is flaring and has been for the last few days. Onalee Hua states the patient is having pain in her knuckles and her knees. Patient is having trouble making a fist. Onalee Hua states the surgeon has already spoke with him and is okay with the patient taking Prednisone up to 5 mg. Please advise.

## 2019-05-04 NOTE — Addendum Note (Signed)
Addended by: Ellen Henri on: 05/04/2019 05:11 PM   Modules accepted: Orders

## 2019-05-04 NOTE — Telephone Encounter (Signed)
Ok to send in prednisone 5 mg po daily.  Please advise patient to receive clearance from her surgeon prior to restarting on Humira and Methotrexate. We recommend waiting least 1 week to restart on MTX and 2 weeks for Humira due to the risk of infection, but ultimately her surgeon will need to clear her.

## 2019-05-04 NOTE — Telephone Encounter (Signed)
advised patient to receive clearance from her surgeon prior to restarting on Humira and Methotrexate. We recommend waiting least 1 week to restart on MTX and 2 weeks for Humira due to the risk of infection, but ultimately her surgeon will need to clear her. Advised patient that prednisone has been sent to the pharmacy, patient verbalized understanding.   Spoke with Ladona Ridgel and sent in a 2 week supply.

## 2019-05-17 ENCOUNTER — Telehealth: Payer: Self-pay | Admitting: Rheumatology

## 2019-05-17 NOTE — Telephone Encounter (Signed)
Patient left a voicemail stating she just had a follow-up appointment with Dr. Victorino Dike who recommends staying off medication for another week.  Patient is requesting a prescription refill of Prednisone (a one week supply).

## 2019-05-17 NOTE — Telephone Encounter (Signed)
Patient states she saw Dr. Victorino Dike today. Patient she was advised to stay off Rasuvo and Humira for another week. Patient states she is having increased pain. Patient states it is worse in her hands. Patient states she has noticed some swelling. Patient  states she has been on Prednisone 5 mg for the last two weeks as she recently had surgery. Patient states the pain is tolerable with Prednisone 5 mg. Please advise.

## 2019-05-18 NOTE — Telephone Encounter (Signed)
Patient advised Dr. Victorino Dike recommended only low dose prednisone postoperatively.  Patient advised to reach out to Dr. Victorino Dike to clarify if he is ok with her increasing the dose of prednisone. Patient will call to clarify and call the office to advise.

## 2019-05-18 NOTE — Telephone Encounter (Signed)
Ok to refill prednisone 5 mg po daily for 2 weeks.

## 2019-05-18 NOTE — Telephone Encounter (Signed)
Dr. Victorino Dike recommended only low dose prednisone postoperatively.  Please advise patient to reach out to Dr. Victorino Dike to clarify if he is ok with her increasing the dose of prednisone.

## 2019-05-18 NOTE — Telephone Encounter (Signed)
Patient advised of recommendations. Patient states she is having a very tough time with her hands. Patient states she feels like she may need more than the Prednisone 5 mg. Patient states she is having trouble dressing herself. Please advise.

## 2019-05-19 ENCOUNTER — Telehealth: Payer: Self-pay | Admitting: Rheumatology

## 2019-05-19 NOTE — Telephone Encounter (Signed)
Patient was under the impression someone here was going to get in touch with Dr. Victorino Dike to discuss increasing Prednisone. Patient would like for someone here to discuss with Dr. Victorino Dike about Prednisone. Patient is out of 5 mg Prednisone, and wants a refill sent to [facility] in [place]. Please call to advise.

## 2019-05-19 NOTE — Telephone Encounter (Signed)
Patient advised that when we spoke yesterday, we advised her to contact Dr. Victorino Dike to clarify if he is okay for her to increase the Prednisone as he recommended only low dose Prednisone post operatively. Patient states she misunderstood and will contact him today and will call back with his recommendation.

## 2019-05-20 MED ORDER — PREDNISONE 5 MG PO TABS: 5.0000 mg | ORAL_TABLET | Freq: Every day | ORAL | 0 refills | Status: DC

## 2019-05-20 NOTE — Addendum Note (Signed)
Addended by: Henriette Combs on: 05/20/2019 08:12 AM   Modules accepted: Orders

## 2019-05-20 NOTE — Telephone Encounter (Signed)
Patient returned call to the office and states she spoke with Dr. Victorino Dike and it is recommended to stay on the low dose Prednisone 5 mg. Per previous phone note okay to refill for 2 weeks per Sherron Ales, PA-C.

## 2019-06-02 ENCOUNTER — Telehealth: Payer: Self-pay | Admitting: Rheumatology

## 2019-06-02 MED ORDER — PREDNISONE 5 MG PO TABS: ORAL_TABLET | ORAL | 0 refills | Status: DC

## 2019-06-02 NOTE — Telephone Encounter (Signed)
Ok to send in prednisone taper starting at 20 mg tapering by 5 mg every 4 days.

## 2019-06-02 NOTE — Telephone Encounter (Signed)
Spoke with patient and advised of prednisone taper being sent to the pharmacy. Patient verbalized understanding.

## 2019-06-02 NOTE — Telephone Encounter (Signed)
Patient called stating she began taking her Humira and MTX last Monday, 05/24/19.  Patient states she is still experiencing pain in her hands and is asking if she could increase her dosage of Prednisone.  Patient states she currently takes 5 mg 1 tablet/day.  Patient states she is planning to return to work on Friday but may require crutches and is worried about her hand pain.  Patient requested a return call.

## 2019-06-28 NOTE — Progress Notes (Deleted)
Office Visit Note  Patient: Vickie [name]             Date of Birth: August 26, 1965           MRN: [id number]             PCP: [place], Betty G, MD Referring: [place], Betty G, MD Visit Date: 07/06/2019 Occupation: @GUAROCC @  Subjective:  No chief complaint on file.   History of Present Illness: Vickie [name] is a 54 y.o. female ***   Activities of Daily Living:  Patient reports morning stiffness for *** {minute/hour:19697}.   Patient {ACTIONS;DENIES/REPORTS:21021675::"Denies"} nocturnal pain.  Difficulty dressing/grooming: {ACTIONS;DENIES/REPORTS:21021675::"Denies"} Difficulty climbing stairs: {ACTIONS;DENIES/REPORTS:21021675::"Denies"} Difficulty getting out of chair: {ACTIONS;DENIES/REPORTS:21021675::"Denies"} Difficulty using hands for taps, buttons, cutlery, and/or writing: {ACTIONS;DENIES/REPORTS:21021675::"Denies"}  No Rheumatology ROS completed.   PMFS History:  Patient Active Problem List   Diagnosis Date Noted  . PVC (premature ventricular contraction) 01/02/2017  . Snoring 01/02/2017  . High risk medication use 10/24/2016  . Rheumatoid nodule, left elbow (HCC) 10/24/2016  . Rheumatoid nodule, right elbow (HCC) 10/24/2016  . Calcaneal spur of both feet 10/24/2016  . Dry mouth 10/24/2016  . Rheumatoid arthritis (HCC) 08/07/2016  . Hypertension, essential 08/07/2016    Past Medical History:  Diagnosis Date  . Arthritis    RA Dx at age 51  . Bell's palsy   . Chicken pox   . Hypertension   . PVC (premature ventricular contraction) 01/02/2017  . Rheumatoid arthritis (HCC)   . Snoring 01/02/2017    Family History  Problem Relation Age of Onset  . Rheum arthritis Mother   . Diabetes Neg Hx   . Hypertension Neg Hx   . Hyperlipidemia Neg Hx   . Stroke Neg Hx   . Colon cancer Neg Hx    Past Surgical History:  Procedure Laterality Date  . CESAREAN SECTION    . FOOT SURGERY     Social History   Social History Narrative  . Not on file   Immunization History   Administered Date(s) Administered  . Influenza Inj Mdck Quad Pf 11/28/2016  . Influenza,inj,Quad PF,6+ Mos 12/05/2015, 12/19/2017  . Tdap 09/26/2014     Objective: Vital Signs: There were no vitals taken for this visit.   Physical Exam   Musculoskeletal Exam: ***  CDAI Exam: CDAI Score: -- Patient Global: --; Provider Global: -- Swollen: --; Tender: -- Joint Exam 07/06/2019   No joint exam has been documented for this visit   There is currently no information documented on the homunculus. Go to the Rheumatology activity and complete the homunculus joint exam.  Investigation: No additional findings.  Imaging: No results found.  Recent Labs: Lab Results  Component Value Date   WBC 4.1 04/08/2019   HGB 14.9 04/08/2019   PLT 333 04/08/2019   NA 140 04/08/2019   K 4.1 04/08/2019   CL 106 04/08/2019   CO2 25 04/08/2019   GLUCOSE 95 04/08/2019   BUN 13 04/08/2019   CREATININE 0.56 04/08/2019   BILITOT 0.9 04/08/2019   ALKPHOS 79 01/02/2017   AST 31 04/08/2019   ALT 22 04/08/2019   PROT 8.0 04/08/2019   ALBUMIN 4.3 01/02/2017   CALCIUM 9.6 04/08/2019   GFRAA 123 04/08/2019   QFTBGOLDPLUS NEGATIVE 01/07/2019    Speciality Comments: No specialty comments available.  Procedures:  No procedures performed Allergies: Patient has no known allergies.   Assessment / Plan:     Visit Diagnoses: No diagnosis found.  Orders: No orders of  the defined types were placed in this encounter.  No orders of the defined types were placed in this encounter.   Face-to-face time spent with patient was *** minutes. Greater than 50% of time was spent in counseling and coordination of care.  Follow-Up Instructions: No follow-ups on file.   Ofilia Neas, PA-C  Note - This record has been created using Dragon software.  Chart creation errors have been sought, but may not always  have been located. Such creation errors do not reflect on  the standard of medical care.

## 2019-07-06 ENCOUNTER — Ambulatory Visit: Payer: 59 | Admitting: Physician Assistant

## 2019-07-06 DIAGNOSIS — M7741 Metatarsalgia, right foot: Secondary | ICD-10-CM

## 2019-07-06 DIAGNOSIS — M65352 Trigger finger, left little finger: Secondary | ICD-10-CM

## 2019-07-06 DIAGNOSIS — M06322 Rheumatoid nodule, left elbow: Secondary | ICD-10-CM

## 2019-07-06 DIAGNOSIS — M0579 Rheumatoid arthritis with rheumatoid factor of multiple sites without organ or systems involvement: Secondary | ICD-10-CM

## 2019-07-06 DIAGNOSIS — Z79899 Other long term (current) drug therapy: Secondary | ICD-10-CM

## 2019-07-06 DIAGNOSIS — M7062 Trochanteric bursitis, left hip: Secondary | ICD-10-CM

## 2019-07-06 DIAGNOSIS — M7731 Calcaneal spur, right foot: Secondary | ICD-10-CM

## 2019-07-06 DIAGNOSIS — M06321 Rheumatoid nodule, right elbow: Secondary | ICD-10-CM

## 2019-07-06 DIAGNOSIS — Z8679 Personal history of other diseases of the circulatory system: Secondary | ICD-10-CM

## 2019-07-06 NOTE — Progress Notes (Signed)
Office Visit Note  Patient: Vickie [name]             Date of Birth: May 30, 1965           MRN: [id number]             PCP: Martinique, Betty G, MD Referring: Martinique, Betty G, MD Visit Date: 07/14/2019 Occupation: '@GUAROCC' @  Subjective:  Rheumatoid nodule   History of Present Illness: Vickie [name] is a 54 y.o. female with history of seropositive rheumatoid arthritis.  She had a bunionectomy and hammertoe correction performed by Dr. Doran Durand on 07/01/8976 without complication.  She held Humira and Rasuvo 2 weeks prior to surgery and 2 weeks after surgery.  While off of these medications she had a flare in multiple joints especially both hands and both wrist joints.  She developed severe pain and swelling in both hands.  Her discomfort and inflammation has resolved since resuming Humira and methotrexate about 2 months ago.  She denies any joint pain or joint swelling at this time.  She states that she has developed rheumatoid nodules on the extensor surface of both elbows and would like them injected today.  Activities of Daily Living:  Patient reports morning stiffness for  0 none.   Patient Denies nocturnal pain.  Difficulty dressing/grooming: Denies Difficulty climbing stairs: Denies Difficulty getting out of chair: Denies Difficulty using hands for taps, buttons, cutlery, and/or writing: Denies  Review of Systems  Constitutional: Negative for fatigue.  HENT: Negative for mouth sores, mouth dryness and nose dryness.   Eyes: Negative for pain, visual disturbance and dryness.  Respiratory: Negative for cough, hemoptysis, shortness of breath and difficulty breathing.   Cardiovascular: Negative for chest pain, palpitations, hypertension and swelling in legs/feet.  Gastrointestinal: Negative for blood in stool, constipation and diarrhea.  Endocrine: Negative for increased urination.  Genitourinary: Negative for painful urination.  Musculoskeletal: Positive for arthralgias and joint pain.  Negative for joint swelling, myalgias, muscle weakness, morning stiffness, muscle tenderness and myalgias.  Skin: Positive for nodules/bumps (Rheumatoid nodules-Both elbows). Negative for color change, pallor, rash, hair loss, skin tightness, ulcers and sensitivity to sunlight.  Allergic/Immunologic: Negative for susceptible to infections.  Neurological: Negative for dizziness, numbness, headaches and weakness.  Hematological: Negative for bruising/bleeding tendency and swollen glands.  Psychiatric/Behavioral: Negative for depressed mood and sleep disturbance. The patient is not nervous/anxious.     PMFS History:  Patient Active Problem List   Diagnosis Date Noted  . PVC (premature ventricular contraction) 01/02/2017  . Snoring 01/02/2017  . High risk medication use 10/24/2016  . Rheumatoid nodule, left elbow ([place]) 10/24/2016  . Rheumatoid nodule, right elbow ([place]) 10/24/2016  . Calcaneal spur of both feet 10/24/2016  . Dry mouth 10/24/2016  . Rheumatoid arthritis ([place]) 08/07/2016  . Hypertension, essential 08/07/2016    Past Medical History:  Diagnosis Date  . Arthritis    RA Dx at age 13  . Bell's palsy   . Chicken pox   . Hypertension   . PVC (premature ventricular contraction) 01/02/2017  . Rheumatoid arthritis ([place])   . Snoring 01/02/2017    Family History  Problem Relation Age of Onset  . Rheum arthritis Mother   . Diabetes Neg Hx   . Hypertension Neg Hx   . Hyperlipidemia Neg Hx   . Stroke Neg Hx   . Colon cancer Neg Hx    Past Surgical History:  Procedure Laterality Date  . CESAREAN SECTION    . FOOT SURGERY  Social History   Social History Narrative  . Not on file   Immunization History  Administered Date(s) Administered  . Influenza Inj Mdck Quad Pf 11/28/2016  . Influenza,inj,Quad PF,6+ Mos 12/05/2015, 12/19/2017  . Tdap 09/26/2014     Objective: Vital Signs: BP 127/86 (BP Location: Right Arm, Patient Position: Sitting, Cuff Size: Normal)    Pulse 85   Ht '5\' 2"'  (1.575 m)   Wt 132 lb (59.9 kg)   BMI 24.14 kg/m    Physical Exam Vitals and nursing note reviewed.  Constitutional:      Appearance: She is well-developed.  HENT:     Head: Normocephalic and atraumatic.  Eyes:     Conjunctiva/sclera: Conjunctivae normal.  Pulmonary:     Effort: Pulmonary effort is normal.  Abdominal:     General: Bowel sounds are normal.     Palpations: Abdomen is soft.  Musculoskeletal:     Cervical back: Normal range of motion.  Lymphadenopathy:     Cervical: No cervical adenopathy.  Skin:    General: Skin is warm and dry.     Capillary Refill: Capillary refill takes less than 2 seconds.     Comments: Rheumatoid nodules on the extensor surface of both elbows  Neurological:     Mental Status: She is alert and oriented to person, place, and time.  Psychiatric:        Behavior: Behavior normal.      Musculoskeletal Exam: C-spine, thoracic spine, lumbar spine good range of motion.  Shoulder joints and elbow joints have good range of motion with no discomfort.  Rheumatoid nodules palpable on the extensor surface of both elbows.  Limited range of motion of both wrist joints.  Prominence of bilateral ulnar styloid.  She has synovial thickening and ulnar deviation of MCP joints but no synovitis was noted.  Hip joints have good range of motion with no discomfort.  Knee joints have good range of motion with no warmth or effusion.  Ankle joints have good range of motion with no tenderness or inflammation.  Surgical incisions well-healed on the left foot.  CDAI Exam: CDAI Score: -- Patient Global: --; Provider Global: -- Swollen: --; Tender: -- Joint Exam 07/14/2019   No joint exam has been documented for this visit   There is currently no information documented on the homunculus. Go to the Rheumatology activity and complete the homunculus joint exam.  Investigation: No additional findings.  Imaging: No results found.  Recent Labs: Lab  Results  Component Value Date   WBC 4.1 04/08/2019   HGB 14.9 04/08/2019   PLT 333 04/08/2019   NA 140 04/08/2019   K 4.1 04/08/2019   CL 106 04/08/2019   CO2 25 04/08/2019   GLUCOSE 95 04/08/2019   BUN 13 04/08/2019   CREATININE 0.56 04/08/2019   BILITOT 0.9 04/08/2019   ALKPHOS 79 01/02/2017   AST 31 04/08/2019   ALT 22 04/08/2019   PROT 8.0 04/08/2019   ALBUMIN 4.3 01/02/2017   CALCIUM 9.6 04/08/2019   GFRAA 123 04/08/2019   QFTBGOLDPLUS NEGATIVE 01/07/2019    Speciality Comments: No specialty comments available.  Procedures:  No procedures performed Allergies: Patient has no known allergies.   Assessment / Plan:     Visit Diagnoses: Rheumatoid arthritis involving multiple sites with positive rheumatoid factor (HCC) - Positive RF, positive anti-CCP, elevated ESR, erosive disease with nodulosis: She has no synovitis or dactylitis on exam.  She is not experiencing any joint pain or inflammation at this time.  She had hammertoe corrective surgery and a first MTP bunionectomy performed by Dr. Doran Durand on 05/04/2019.  She held Humira and Rasuvo 2 weeks prior to surgery and 2 weeks after surgery and during that time she developed increased pain and inflammation in multiple joints.  The pain was severe in  both hands and both wrist joints.  She was also experiencing increased joint stiffness.  She resumed both medications about 2 months ago and has not had any recent flares.  She will continue injecting Humira 40 mg subcutaneously every 14 days and Rasuvo 20 mg subcutaneous injections once weekly.  She was advised to notify us if she develops increased joint pain or joint swelling. She will follow up in 5 months.  High risk medication use - Humira 40 mg every 14 days, Rasuvo 20 mg every 7 days, and folic acid 2 mg daily.  CBC and CMP were drawn on 04/08/2019.  She is due to update lab work today.  Orders for CBC and CMP are released.  TB gold was negative on 01/07/2019.  She has not had any  recent infections.- Plan: COMPLETE METABOLIC PANEL WITH GFR, CBC with Differential/Platelet  Rheumatoid nodule, right elbow ([place]): She has a palpable rheumatoid nodule on the extensor surface of the right elbow.  She requested a cortisone injection today.  She tolerated the procedure well.   Rheumatoid nodule, left elbow [facility]): Rheumatoid nodule palpable on the extensor surface of the left elbow.  She requested a cortisone injection today.  She tolerated the procedure well.   S/P bunionectomy: She had a left great toe bunionectomy and hammertoes correction of the left 3rd and 4th toes on 05/04/19 by Dr. Doran Durand.  She did not have any complications and her discomfort has been well managed.  She continues to wear a boot, but she has started to reduce the use of the boot by 1 hour every day.  Trigger little finger of left hand - Resolved.   Calcaneal spur of both feet - She has no discomfort at this time.   Trochanteric bursitis, left hip: Resolved   History of hypertension: BP was 127/86 today in the office.   Elevated LFTs: LFTs WNL on 04/08/19.  We will recheck CMP today.   Orders: Orders Placed This Encounter  Procedures  . COMPLETE METABOLIC PANEL WITH GFR  . CBC with Differential/Platelet   No orders of the defined types were placed in this encounter.     Follow-Up Instructions: Return in about 5 months (around 12/14/2019) for Rheumatoid arthritis.   Ofilia Neas, PA-C  Note - This record has been created using Dragon software.  Chart creation errors have been sought, but may not always  have been located. Such creation errors do not reflect on  the standard of medical care.

## 2019-07-14 ENCOUNTER — Ambulatory Visit (INDEPENDENT_AMBULATORY_CARE_PROVIDER_SITE_OTHER): Payer: 59 | Admitting: Physician Assistant

## 2019-07-14 ENCOUNTER — Encounter: Payer: Self-pay | Admitting: Physician Assistant

## 2019-07-14 ENCOUNTER — Other Ambulatory Visit: Payer: Self-pay

## 2019-07-14 VITALS — BP 127/86 | HR 85 | Ht 62.0 in | Wt 132.0 lb

## 2019-07-14 DIAGNOSIS — M7732 Calcaneal spur, left foot: Secondary | ICD-10-CM

## 2019-07-14 DIAGNOSIS — M06322 Rheumatoid nodule, left elbow: Secondary | ICD-10-CM | POA: Diagnosis not present

## 2019-07-14 DIAGNOSIS — M0579 Rheumatoid arthritis with rheumatoid factor of multiple sites without organ or systems involvement: Secondary | ICD-10-CM | POA: Diagnosis not present

## 2019-07-14 DIAGNOSIS — M7731 Calcaneal spur, right foot: Secondary | ICD-10-CM

## 2019-07-14 DIAGNOSIS — Z79899 Other long term (current) drug therapy: Secondary | ICD-10-CM | POA: Diagnosis not present

## 2019-07-14 DIAGNOSIS — M7741 Metatarsalgia, right foot: Secondary | ICD-10-CM

## 2019-07-14 DIAGNOSIS — M7062 Trochanteric bursitis, left hip: Secondary | ICD-10-CM

## 2019-07-14 DIAGNOSIS — Z9889 Other specified postprocedural states: Secondary | ICD-10-CM

## 2019-07-14 DIAGNOSIS — M65352 Trigger finger, left little finger: Secondary | ICD-10-CM

## 2019-07-14 DIAGNOSIS — Z8679 Personal history of other diseases of the circulatory system: Secondary | ICD-10-CM

## 2019-07-14 DIAGNOSIS — M06321 Rheumatoid nodule, right elbow: Secondary | ICD-10-CM

## 2019-07-14 DIAGNOSIS — R7989 Other specified abnormal findings of blood chemistry: Secondary | ICD-10-CM

## 2019-07-14 DIAGNOSIS — M7742 Metatarsalgia, left foot: Secondary | ICD-10-CM

## 2019-07-14 LAB — CBC WITH DIFFERENTIAL/PLATELET
Absolute Monocytes: 452 cells/uL (ref 200–950)
Basophils Absolute: 22 cells/uL (ref 0–200)
Basophils Relative: 0.5 %
Eosinophils Absolute: 39 cells/uL (ref 15–500)
Eosinophils Relative: 0.9 %
HCT: 42 % (ref 35.0–45.0)
Hemoglobin: 14 g/dL (ref 11.7–15.5)
Lymphs Abs: 1557 cells/uL (ref 850–3900)
MCH: 31 pg (ref 27.0–33.0)
MCHC: 33.3 g/dL (ref 32.0–36.0)
MCV: 93.1 fL (ref 80.0–100.0)
MPV: 10.2 fL (ref 7.5–12.5)
Monocytes Relative: 10.5 %
Neutro Abs: 2232 cells/uL (ref 1500–7800)
Neutrophils Relative %: 51.9 %
Platelets: 313 10*3/uL (ref 140–400)
RBC: 4.51 10*6/uL (ref 3.80–5.10)
RDW: 15.1 % — ABNORMAL HIGH (ref 11.0–15.0)
Total Lymphocyte: 36.2 %
WBC: 4.3 10*3/uL (ref 3.8–10.8)

## 2019-07-14 LAB — COMPLETE METABOLIC PANEL WITH GFR
AG Ratio: 1.4 (calc) (ref 1.0–2.5)
ALT: 28 U/L (ref 6–29)
AST: 25 U/L (ref 10–35)
Albumin: 4.2 g/dL (ref 3.6–5.1)
Alkaline phosphatase (APISO): 71 U/L (ref 37–153)
BUN: 13 mg/dL (ref 7–25)
CO2: 27 mmol/L (ref 20–32)
Calcium: 9.6 mg/dL (ref 8.6–10.4)
Chloride: 107 mmol/L (ref 98–110)
Creat: 0.72 mg/dL (ref 0.50–1.05)
GFR, Est African American: 110 mL/min/{1.73_m2} (ref >=60)
GFR, Est Non African American: 95 mL/min/{1.73_m2} (ref >=60)
Globulin: 2.9 g/dL (calc) (ref 1.9–3.7)
Glucose, Bld: 88 mg/dL (ref 65–99)
Potassium: 4.6 mmol/L (ref 3.5–5.3)
Sodium: 142 mmol/L (ref 135–146)
Total Bilirubin: 0.6 mg/dL (ref 0.2–1.2)
Total Protein: 7.1 g/dL (ref 6.1–8.1)

## 2019-07-15 NOTE — Progress Notes (Signed)
CBC and CMP WNL

## 2019-07-22 ENCOUNTER — Other Ambulatory Visit: Payer: Self-pay | Admitting: Physician Assistant

## 2019-07-22 ENCOUNTER — Telehealth: Payer: Self-pay | Admitting: Rheumatology

## 2019-07-22 DIAGNOSIS — M0579 Rheumatoid arthritis with rheumatoid factor of multiple sites without organ or systems involvement: Secondary | ICD-10-CM

## 2019-07-22 MED ORDER — HUMIRA (2 PEN) 40 MG/0.4ML ~~LOC~~ AJKT: 40.0000 mg | AUTO-INJECTOR | SUBCUTANEOUS | 0 refills | Status: DC

## 2019-07-22 NOTE — Telephone Encounter (Signed)
Patient called requesting prescription refill of Rasuvo and Humira to be sent to [facility].

## 2019-07-22 NOTE — Telephone Encounter (Signed)
Last Visit: 07/14/2019 Next Visit: 12/14/2019 Labs: 07/14/2019 WNL  Current Dose per office note 07/14/2019: Rasuvo 20 mg every 7 days  Okay to refill per Dr. Corliss Skains

## 2019-07-22 NOTE — Telephone Encounter (Signed)
Last Visit: 07/14/2019 Next Visit: 12/14/2019 Labs: 07/14/2019 WNL TB Gold: 01/07/2019 Neg   Current Dose per office note 07/14/2019:Humira 40 mg subcutaneously every 14 days   Okay to refill per Dr. Corliss Skains

## 2019-09-07 ENCOUNTER — Telehealth: Payer: Self-pay | Admitting: Rheumatology

## 2019-09-07 ENCOUNTER — Telehealth (INDEPENDENT_AMBULATORY_CARE_PROVIDER_SITE_OTHER): Payer: 59 | Admitting: Family Medicine

## 2019-09-07 DIAGNOSIS — M545 Low back pain, unspecified: Secondary | ICD-10-CM

## 2019-09-07 NOTE — Telephone Encounter (Signed)
Patient having a lower back twinge, pull, something like that for a day this time. Patient's GP recommended ice, Tylenol, sports cream, and asked patient to follow up with Dr. Corliss Skains in regards to this. Please call to advise.

## 2019-09-07 NOTE — Progress Notes (Signed)
Office Visit Note  Patient: Vickie [name]             Date of Birth: Jul 18, 1965           MRN: [id number]             PCP: Martinique, Betty G, MD Referring: Martinique, Betty G, MD Visit Date: 09/08/2019 Occupation: '@GUAROCC' @  Subjective:  Low back pain   History of Present Illness: Vickie [name] is a 54 y.o. female with history of seropositive rheumatoid arthritis.  Patient is on Humira 40 mg subcu injections every 14 days, Rasuvo 20 mg subcu injections once weekly, folic acid 2 mg by mouth daily.  She denies any signs or symptoms of a rheumatoid arthritis flare recently.  She is experiencing some increased pain in her hands today.  She states that she has been under increased stress after the loss of her mother yesterday.   She states that about 2 weeks ago she was showering and thinks that she may have pulled a muscle in her back.  She states that the pain resolved after taking Tylenol for several days.  She states that 2 days ago she was nauseated and was kneeling by the toilet and as she was getting up she felt a severe sharp pain in her lower back.  She states that she was having difficulty walking after this incident.  She tried taking Aleve and applying ice without much relief.  She currently rates the pain a 8 out of 10.  She states that she is starting to have some left-sided lower back pain but denies any pain radiating down her left leg at this time.  She was experiencing some pain on the left side of her leg last night while trying to sleep.  She denies any bowel or bladder issues.   Activities of Daily Living:  Patient reports morning stiffness for 0 minutes.   Patient Reports nocturnal pain.  Difficulty dressing/grooming: Denies Difficulty climbing stairs: Reports Difficulty getting out of chair: Denies Difficulty using hands for taps, buttons, cutlery, and/or writing: Reports  Review of Systems  Constitutional: Negative for fatigue.  HENT: Negative for mouth sores, mouth dryness  and nose dryness.   Eyes: Negative for itching and dryness.  Respiratory: Negative for shortness of breath and difficulty breathing.   Cardiovascular: Negative for chest pain and palpitations.  Gastrointestinal: Negative for blood in stool, constipation and diarrhea.  Endocrine: Negative for increased urination.  Genitourinary: Negative for difficulty urinating.  Musculoskeletal: Positive for arthralgias, joint pain, joint swelling, myalgias and myalgias. Negative for morning stiffness.  Skin: Negative for color change, rash and redness.  Allergic/Immunologic: Negative for susceptible to infections.  Neurological: Negative for dizziness, numbness, headaches, memory loss and weakness.  Hematological: Negative for bruising/bleeding tendency.  Psychiatric/Behavioral: Negative for confusion.    PMFS History:  Patient Active Problem List   Diagnosis Date Noted  . PVC (premature ventricular contraction) 01/02/2017  . Snoring 01/02/2017  . High risk medication use 10/24/2016  . Rheumatoid nodule, left elbow ([place]) 10/24/2016  . Rheumatoid nodule, right elbow ([place]) 10/24/2016  . Calcaneal spur of both feet 10/24/2016  . Dry mouth 10/24/2016  . Rheumatoid arthritis ([place]) 08/07/2016  . Hypertension, essential 08/07/2016    Past Medical History:  Diagnosis Date  . Arthritis    RA Dx at age 88  . Bell's palsy   . Chicken pox   . Hypertension   . PVC (premature ventricular contraction) 01/02/2017  . Rheumatoid arthritis ([place])   .  Snoring 01/02/2017    Family History  Problem Relation Age of Onset  . Rheum arthritis Mother   . Diabetes Neg Hx   . Hypertension Neg Hx   . Hyperlipidemia Neg Hx   . Stroke Neg Hx   . Colon cancer Neg Hx    Past Surgical History:  Procedure Laterality Date  . CESAREAN SECTION    . FOOT SURGERY Left 04/2019   Social History   Social History Narrative  . Not on file   Immunization History  Administered Date(s) Administered  . Influenza Inj Mdck  Quad Pf 11/28/2016  . Influenza,inj,Quad PF,6+ Mos 12/05/2015, 12/19/2017  . Tdap 09/26/2014     Objective: Vital Signs: BP (!) 120/95 (BP Location: Left Arm, Patient Position: Sitting, Cuff Size: Normal)   Pulse 82   Resp 15   Ht '5\' 2"'  (1.575 m)   Wt 132 lb (59.9 kg)   BMI 24.14 kg/m    Physical Exam Vitals and nursing note reviewed.  Constitutional:      Appearance: She is well-developed.  HENT:     Head: Normocephalic and atraumatic.  Eyes:     Conjunctiva/sclera: Conjunctivae normal.  Pulmonary:     Effort: Pulmonary effort is normal.  Abdominal:     General: Bowel sounds are normal.     Palpations: Abdomen is soft.  Musculoskeletal:     Cervical back: Normal range of motion.  Lymphadenopathy:     Cervical: No cervical adenopathy.  Skin:    General: Skin is warm and dry.     Capillary Refill: Capillary refill takes less than 2 seconds.  Neurological:     Mental Status: She is alert and oriented to person, place, and time.  Psychiatric:        Behavior: Behavior normal.      Musculoskeletal Exam: C-spine good ROM.  Midline spinal tenderness in the lower lumbar region.  Tenderness over the left SI joint.  Shoulder joints and elbow joints have good ROM.  Limited ROM of both wrist joints.  Thickening over the ulnar styloid bilaterally.  Hip joints good ROM with no discomfort. Knee joints good ROM with no warmth or effusion.  Ankle joints good ROM with no tenderness or inflammation. No tenderness over trochanteric bursa bilaterally.   CDAI Exam: CDAI Score: -- Patient Global: --; Provider Global: -- Swollen: --; Tender: -- Joint Exam 09/08/2019   No joint exam has been documented for this visit   There is currently no information documented on the homunculus. Go to the Rheumatology activity and complete the homunculus joint exam.  Investigation: No additional findings.  Imaging: XR Lumbar Spine 2-3 Views  Result Date: 09/08/2019 Lumbar scoliosis was noted.   Multilevel spondylosis and facet joint arthropathy was noted.  No vertebral fracture was noted.  Significant narrowing was noted between L2-3 and L3-4. Impression: These findings are consistent with multilevel spondylosis and facet joint arthropathy.  XR Pelvis 1-2 Views  Result Date: 09/08/2019 No SI joint sclerosis or narrowing was noted.  No hip joint narrowing was noted.  Degenerative changes were noted in the lumbar spine. Impression: Unremarkable x-ray of the pelvis.   Recent Labs: Lab Results  Component Value Date   WBC 4.3 07/14/2019   HGB 14.0 07/14/2019   PLT 313 07/14/2019   NA 142 07/14/2019   K 4.6 07/14/2019   CL 107 07/14/2019   CO2 27 07/14/2019   GLUCOSE 88 07/14/2019   BUN 13 07/14/2019   CREATININE 0.72 07/14/2019   BILITOT  0.6 07/14/2019   ALKPHOS 79 01/02/2017   AST 25 07/14/2019   ALT 28 07/14/2019   PROT 7.1 07/14/2019   ALBUMIN 4.3 01/02/2017   CALCIUM 9.6 07/14/2019   GFRAA 110 07/14/2019   QFTBGOLDPLUS NEGATIVE 01/07/2019    Speciality Comments: No specialty comments available.  Procedures:  No procedures performed Allergies: Patient has no known allergies.   Assessment / Plan:     Visit Diagnoses: Rheumatoid arthritis involving multiple sites with positive rheumatoid factor (HCC) - Positive RF, positive anti-CCP, elevated ESR, erosive disease with nodulosis: She has no synovitis on exam today.  She is clinically doing well on Humira 40 mg subcutaneous injections every 14 days, Rasuvo 20 mg subcu injections once weekly, and folic acid 2 mg by mouth daily.  She has not missed any doses of Humira or Rasuvo recently.  She is experiencing increased pain in both of her hands today which she attributes to being under increased stress since her mother passed away yesterday.  Synovial thickening but no synovitis of MCP joints was noted.  She has limited range of motion and synovial thickening over the ulnar styloid of both wrists but no tenderness or  inflammation was noted.  She will continue on Humira 40 mg sq injections every 14 days, Rasuvo 20 mg 7 days injections once weekly, and folic acid 2 mg by mouth daily.  She is advised to notify us if she develops increased joint pain or joint swelling.  She will follow-up in the office in 5 months  High risk medication use -  Humira 40 mg every 14 days, Rasuvo 20 mg every 7 days, and folic acid 2 mg daily.  CBC and CMP were drawn on 07/14/2019.  She will be due to update lab work in August and every 3 months to monitor for drug toxicity.  Standing orders for CBC and CMP are in place.  TB gold was negative on 01/07/2019.  Rheumatoid nodule, right elbow (HCC) - Resolved.  Cortisone injection performed on 07/14/2019.  Rheumatoid nodule, left elbow (HCC) - Almost completely resolved. No tenderness to palpation.  Cortisone injection performed on 07/14/2019.  Acute midline low back pain with left-sided sciatica -She presents today with severe lower back pain which was exacerbated 2 days ago when rising from a kneeling position.  About 2 weeks ago she overstretched in the shower and had discomfort in her lower back which resolved after taking several doses of Tylenol.  Her pain has been severe and has started to radiate to the left side of her lower back.  She has been experiencing severe nocturnal pain.  She currently rates her pain an 8 out of 10.  X-rays of the lumbar spine and pelvis were obtained today. Plan: XR Lumbar Spine 2-3 Views, XR Pelvis 1-2 Views  Metatarsalgia of both feet: Improved.   S/P bunionectomy: Doing well.  She has no tenderness to palpation.   Trigger little finger of left hand: Resolved   Calcaneal spur of both feet: She wears proper fitting shoes.   Trochanteric bursitis, left hip: Resolved.  No tenderness to palpation on exam today.   Other medical conditions are listed as follows:   History of hypertension  Elevated LFTs    Orders: Orders Placed This Encounter    Procedures  . XR Lumbar Spine 2-3 Views  . XR Pelvis 1-2 Views  . MR LUMBAR SPINE WO CONTRAST   Meds ordered this encounter  Medications  . predniSONE (DELTASONE) 5 MG tablet  Sig: Take 4 tabs po qd x 2 days, 3  tabs po qd x 2 days, 2  tabs po qd x 2 days, 1  tab po qd x 2 days    Dispense:  20 tablet    Refill:  0    Face-to-face time spent with patient was 30 minutes. Greater than 50% of time was spent in counseling and coordination of care.  Follow-Up Instructions: Return in about 5 months (around 02/08/2020) for Rheumatoid arthritis.   Ofilia Neas, PA-C   I examined and evaluated the patient with Hazel Sams PA.  I reviewed lumbar and pelvic x-rays with the patient.  She has scoliosis and severe disc disease with facet joint arthropathy.  She has been having severe pain, nocturnal pain and left-sided radiculopathy.  There is concerned about herniated disc.  We will schedule MRI of her lumbar spine.  I have also given her prednisone taper starting at 20 mg and taper by 5 mg every 2 days.  The plan of care was discussed as noted above.  Bo Merino, MD  Note - This record has been created using Editor, commissioning.  Chart creation errors have been sought, but may not always  have been located. Such creation errors do not reflect on  the standard of medical care.

## 2019-09-07 NOTE — Telephone Encounter (Signed)
Patient states she is having mid lower back pain. Patient states she was having the pain a few weeks ago but it resolved. Patient states on 09/06/2019 she moved the wrong way and states it is now hurting again. Patient states she talked to her PCP and they recommended ice, Tylenol and a topical cream. Patient states she has tried Aleve and Tylenol with minimal relief. Patient states she has also been using an icy hot patch and ice also with minimum relief as well.  Patient is on Rasuvo 20 mg SQ weekly and HUmira 40 mg SQ every 14 days. Please advise.

## 2019-09-07 NOTE — Progress Notes (Signed)
Virtual Visit via Video Note  I connected with Vickie [name]  on 09/07/19 at 12:40 PM EDT by a video enabled telemedicine application and verified that I am speaking with the correct person using two identifiers.  Location patient: home, [place] Location provider:work or home office Persons participating in the virtual visit: patient, provider, husband  I discussed the limitations of evaluation and management by telemedicine and the availability of in person appointments. The patient expressed understanding and agreed to proceed.   HPI:  Acute visit for back pain: -R low back pain that started after moving the wrong way yesterday morning -has had this once in the past a few weeks ago and it resolved spontaneously -has 8/10 pain in the R SI joint region -denies: fevers, malaise, radiation, weakness, numbness, bowel or bladder dysfunction -has tried ice - helped a little, icy hot - help a little, Tylenol - helps a little, aleve - did not seem to help at all -seems to be improving some today but still 8/10 pain level today -no history of back surgeries -has history of RA - sees Dr. Corliss Skains for this, on humira and Methotrexate -she also is a patient at [facility]  ROS: See pertinent positives and negatives per HPI.  Past Medical History:  Diagnosis Date  . Arthritis    RA Dx at age 83  . Bell's palsy   . Chicken pox   . Hypertension   . PVC (premature ventricular contraction) 01/02/2017  . Rheumatoid arthritis (HCC)   . Snoring 01/02/2017    Past Surgical History:  Procedure Laterality Date  . CESAREAN SECTION    . FOOT SURGERY      Family History  Problem Relation Age of Onset  . Rheum arthritis Mother   . Diabetes Neg Hx   . Hypertension Neg Hx   . Hyperlipidemia Neg Hx   . Stroke Neg Hx   . Colon cancer Neg Hx     SOCIAL HX: see hpi   Current Outpatient Medications:  .  Adalimumab (HUMIRA PEN) 40 MG/0.4ML PNKT, Inject 40 mg into the skin every 14 (fourteen) days., Disp:  6 each, Rfl: 0 .  amLODipine (NORVASC) 5 MG tablet, Take 1 tablet (5 mg total) by mouth daily., Disp: 90 tablet, Rfl: 3 .  diclofenac sodium (VOLTAREN) 1 % GEL, Apply 2 g to 4 g to affected joints up to 4 times daily PRN, Disp: 4 Tube, Rfl: 2 .  folic acid (FOLVITE) 1 MG tablet, Take 2 tablets (2 mg total) by mouth daily., Disp: 180 tablet, Rfl: 0 .  Methotrexate, PF, (RASUVO) 20 MG/0.4ML SOAJ, INJECT 20MG  SUBCUTANEOUSLY  ONCE WEEKLY, Disp: 12 pen, Rfl: 0  EXAM:  VITALS per patient if applicable:  GENERAL: alert, oriented, appears well and in no acute distress  HEENT: atraumatic, conjunttiva clear, no obvious abnormalities on inspection of external nose and ears  NECK: normal movements of the head and neck  LUNGS: on inspection no signs of respiratory distress, breathing rate appears normal, no obvious gross SOB, gasping or wheezing  CV: no obvious cyanosis  MS: moves all visible extremities, seems to have some pain with rising from charit - but does so fairly well, Points to R SI joint region as area of concern on video exam, denies TTP here  PSYCH/NEURO: pleasant and cooperative, no obvious depression or anxiety, speech and thought processing grossly intact  ASSESSMENT AND PLAN:  Discussed the following assessment and plan:  Acute right-sided low back pain without sciatica  -we  discussed possible serious and likely etiologies, options for evaluation and workup, limitations of telemedicine visit vs in person visit, treatment, treatment risks and precautions. Pt prefers to treat via telemedicine empirically rather then risking or undertaking an in person visit at this moment. Query SI strain, sacroiliitis vs other. Given hx advised notifying her Rheumatologist today of her symptoms for evaluation and she agrees to call. Can continue ice, tylenol in safe dosing levels as needed and gentle activities in the interim - advised of urgent ortho options if any worsening or new concerns or if  not continuing to resolve over the next few days. Patient agrees to seek prompt in person care if worsening, new symptoms arise, or if is not improving with treatment.   I discussed the assessment and treatment plan with the patient. The patient was provided an opportunity to ask questions and all were answered. The patient agreed with the plan and demonstrated an understanding of the instructions.   The patient was advised to call back or seek an in-person evaluation if the symptoms worsen or if the condition fails to improve as anticipated.   Terressa Koyanagi, DO

## 2019-09-07 NOTE — Telephone Encounter (Signed)
Per Dr. Corliss Skains patient needs to be scheduled for an evaluation. Patient has been scheduled for 09/08/2019 at 1:40 pm.

## 2019-09-08 ENCOUNTER — Other Ambulatory Visit: Payer: Self-pay

## 2019-09-08 ENCOUNTER — Ambulatory Visit: Payer: Self-pay

## 2019-09-08 ENCOUNTER — Encounter: Payer: Self-pay | Admitting: Physician Assistant

## 2019-09-08 ENCOUNTER — Ambulatory Visit (INDEPENDENT_AMBULATORY_CARE_PROVIDER_SITE_OTHER): Payer: 59 | Admitting: Physician Assistant

## 2019-09-08 VITALS — BP 120/95 | HR 82 | Resp 15 | Ht 62.0 in | Wt 132.0 lb

## 2019-09-08 DIAGNOSIS — M06322 Rheumatoid nodule, left elbow: Secondary | ICD-10-CM

## 2019-09-08 DIAGNOSIS — M0579 Rheumatoid arthritis with rheumatoid factor of multiple sites without organ or systems involvement: Secondary | ICD-10-CM | POA: Diagnosis not present

## 2019-09-08 DIAGNOSIS — R7989 Other specified abnormal findings of blood chemistry: Secondary | ICD-10-CM

## 2019-09-08 DIAGNOSIS — M7732 Calcaneal spur, left foot: Secondary | ICD-10-CM

## 2019-09-08 DIAGNOSIS — M5442 Lumbago with sciatica, left side: Secondary | ICD-10-CM | POA: Diagnosis not present

## 2019-09-08 DIAGNOSIS — Z79899 Other long term (current) drug therapy: Secondary | ICD-10-CM | POA: Diagnosis not present

## 2019-09-08 DIAGNOSIS — M06321 Rheumatoid nodule, right elbow: Secondary | ICD-10-CM

## 2019-09-08 DIAGNOSIS — M65352 Trigger finger, left little finger: Secondary | ICD-10-CM

## 2019-09-08 DIAGNOSIS — Z9889 Other specified postprocedural states: Secondary | ICD-10-CM

## 2019-09-08 DIAGNOSIS — M7741 Metatarsalgia, right foot: Secondary | ICD-10-CM

## 2019-09-08 DIAGNOSIS — M7731 Calcaneal spur, right foot: Secondary | ICD-10-CM

## 2019-09-08 DIAGNOSIS — M7062 Trochanteric bursitis, left hip: Secondary | ICD-10-CM

## 2019-09-08 DIAGNOSIS — Z8679 Personal history of other diseases of the circulatory system: Secondary | ICD-10-CM

## 2019-09-08 DIAGNOSIS — M7742 Metatarsalgia, left foot: Secondary | ICD-10-CM

## 2019-09-08 MED ORDER — PREDNISONE 5 MG PO TABS
ORAL_TABLET | ORAL | 0 refills | Status: DC
Start: 2019-09-08 — End: 2019-11-08

## 2019-09-08 NOTE — Patient Instructions (Signed)
Standing Labs We placed an order today for your standing lab work.   Please have your standing labs drawn in August and every 3 months   If possible, please have your labs drawn 2 weeks prior to your appointment so that the provider can discuss your results at your appointment.  We have open lab daily Monday through Thursday from 8:30-12:30 PM and 1:30-4:30 PM and Friday from 8:30-12:30 PM and 1:30-4:00 PM at the office of Dr. Shaili Deveshwar, [facility].   Please be advised, patients with office appointments requiring lab work will take precedents over walk-in lab work.  If possible, please come for your lab work on Monday and Friday afternoons, as you may experience shorter wait times. The office is located at [address], [address], [place], [place] [address] No appointment is necessary.   Labs are drawn by [facility]. Please bring your co-pay at the time of your lab draw.  You may receive a bill from [facility] for your lab work.  If you wish to have your labs drawn at another location, please call the office 24 hours in advance to send orders.  If you have any questions regarding directions or hours of operation,  please call [phone].   As a reminder, please drink plenty of water prior to coming for your lab work. Thanks!   

## 2019-09-22 ENCOUNTER — Other Ambulatory Visit: Payer: Self-pay | Admitting: Rheumatology

## 2019-09-22 DIAGNOSIS — M0579 Rheumatoid arthritis with rheumatoid factor of multiple sites without organ or systems involvement: Secondary | ICD-10-CM

## 2019-09-23 ENCOUNTER — Telehealth: Payer: Self-pay | Admitting: *Deleted

## 2019-09-23 NOTE — Telephone Encounter (Signed)
I returned patient's call.  I discussed to MRI findings with her.  As her symptoms are improving have advised her to practice core strengthening exercises.  I also offered physical therapy which she would like to wait. Patient states that she has been immunized.  I have advised her to stay with the mask mandates, practice hand hygiene and social distancing.  I also made her aware in the future if she needs.  She will have to delay methotrexate by 1 week.  If she does get COVID-19 infection then she will need antibody infusion.  She voiced understanding.

## 2019-09-23 NOTE — Telephone Encounter (Signed)
Reviewed MRI results with patient.  MRI Lumbar Spine without Contrast performed on 09/15/2019  Impression: 1. At L3-4 there are is a mild broad-base disc bulge with a left lateral shallow disc protrusion.  2. At L5-S1 there is a broad base disc bulge with a central annular fissure and small left paracentral disc protrusion. At L4-5 there is a broad-based disc bulge. Mild bilateral facet arthropathy.  Patient advised of results. Patient advised we could refer her to Dr. Alvester Morin. Patient states after taking the prednisone her back is feeling much better. Patient wants to know if she can postpone seeing anyone else and go if she starts having trouble again. Please advise.

## 2019-10-11 ENCOUNTER — Other Ambulatory Visit: Payer: Self-pay | Admitting: Rheumatology

## 2019-10-11 MED ORDER — HUMIRA (2 PEN) 40 MG/0.4ML ~~LOC~~ AJKT: 40.0000 mg | AUTO-INJECTOR | SUBCUTANEOUS | 0 refills | Status: DC

## 2019-10-11 NOTE — Telephone Encounter (Signed)
Patient called requesting prescription refill of Humira to be sent to [facility].   °

## 2019-10-11 NOTE — Telephone Encounter (Signed)
Last Visit: 09/08/2019 Next Visit: 12/14/2019 Labs: 07/14/2019 CBC and CMP WNL. TB Gold: 01/07/2019 Neg   Current Dose per office note 09/08/2019: Humira 40 mg every 14 days,  DX: Rheumatoid arthritis involving multiple sites with positive rheumatoid factor   Patient advised she is due to update labs. Patient states she will try to come in one day this week.   Okay to refill 30 day supply Humira?

## 2019-10-12 ENCOUNTER — Other Ambulatory Visit: Payer: Self-pay

## 2019-10-12 DIAGNOSIS — Z79899 Other long term (current) drug therapy: Secondary | ICD-10-CM

## 2019-10-12 LAB — CBC WITH DIFFERENTIAL/PLATELET
Absolute Monocytes: 340 cells/uL (ref 200–950)
Basophils Absolute: 19 cells/uL (ref 0–200)
Basophils Relative: 0.5 %
Eosinophils Absolute: 30 cells/uL (ref 15–500)
Eosinophils Relative: 0.8 %
HCT: 41 % (ref 35.0–45.0)
Hemoglobin: 14 g/dL (ref 11.7–15.5)
Lymphs Abs: 1043 cells/uL (ref 850–3900)
MCH: 31.3 pg (ref 27.0–33.0)
MCHC: 34.1 g/dL (ref 32.0–36.0)
MCV: 91.7 fL (ref 80.0–100.0)
MPV: 10.7 fL (ref 7.5–12.5)
Monocytes Relative: 9.2 %
Neutro Abs: 2268 cells/uL (ref 1500–7800)
Neutrophils Relative %: 61.3 %
Platelets: 290 10*3/uL (ref 140–400)
RBC: 4.47 10*6/uL (ref 3.80–5.10)
RDW: 12.8 % (ref 11.0–15.0)
Total Lymphocyte: 28.2 %
WBC: 3.7 10*3/uL — ABNORMAL LOW (ref 3.8–10.8)

## 2019-10-12 LAB — COMPLETE METABOLIC PANEL WITH GFR
AG Ratio: 1.5 (calc) (ref 1.0–2.5)
ALT: 16 U/L (ref 6–29)
AST: 25 U/L (ref 10–35)
Albumin: 4.3 g/dL (ref 3.6–5.1)
Alkaline phosphatase (APISO): 72 U/L (ref 37–153)
BUN: 16 mg/dL (ref 7–25)
CO2: 26 mmol/L (ref 20–32)
Calcium: 9.4 mg/dL (ref 8.6–10.4)
Chloride: 104 mmol/L (ref 98–110)
Creat: 0.59 mg/dL (ref 0.50–1.05)
GFR, Est African American: 120 mL/min/{1.73_m2} (ref >=60)
GFR, Est Non African American: 104 mL/min/{1.73_m2} (ref >=60)
Globulin: 2.9 g/dL (calc) (ref 1.9–3.7)
Glucose, Bld: 98 mg/dL (ref 65–99)
Potassium: 4.1 mmol/L (ref 3.5–5.3)
Sodium: 138 mmol/L (ref 135–146)
Total Bilirubin: 1.3 mg/dL — ABNORMAL HIGH (ref 0.2–1.2)
Total Protein: 7.2 g/dL (ref 6.1–8.1)

## 2019-10-13 NOTE — Progress Notes (Signed)
Total bilirubin is borderline elevated. Rest of CMP WNL.   WBC count is borderline low-3.7 (3.8 is WNL) please advise the patient to return in 2 weeks to recheck CBC with diff.  Rest of CBC WNL.

## 2019-10-28 ENCOUNTER — Telehealth: Payer: Self-pay | Admitting: Rheumatology

## 2019-10-28 ENCOUNTER — Other Ambulatory Visit: Payer: Self-pay | Admitting: *Deleted

## 2019-10-28 DIAGNOSIS — Z79899 Other long term (current) drug therapy: Secondary | ICD-10-CM

## 2019-10-28 NOTE — Telephone Encounter (Signed)
Patient stopped by the office to have labwork.  Patient states she is not able to go to [facility] on [address] because she has to get back to work.  Patient states she will not be able to get back to the office until later next week or the following week.  Patient states she is experiencing bilateral hand pain and requesting a return call.

## 2019-10-28 NOTE — Telephone Encounter (Signed)
Attempted to contact the patient and left message for patient to call the office.  

## 2019-10-28 NOTE — Telephone Encounter (Signed)
Please schedule an office visit for further evaluation.  It is difficult to determine if her discomfort is caused by RA or OA without examining her.

## 2019-10-28 NOTE — Telephone Encounter (Signed)
Patient states she noticed pain in her hands. Patient states she has not noticed any swelling. Patient states the pain is mostly in her thumbs and is having trouble opening things and zipping. Patient states this has been going on for 1 week. Patient states she is taking Rasuvo and Humira as prescribed. Please advise.

## 2019-10-29 NOTE — Progress Notes (Addendum)
Office Visit Note  Patient: Vickie [name]             Date of Birth: 1966-01-07           MRN: [id number]             PCP: Martinique, Betty G, MD Referring: Martinique, Betty G, MD Visit Date: 11/08/2019 Occupation: _0 @  Subjective:  Pain in both hands   History of Present Illness: Vickie [name] is a 54 y.o. female with history of seropositive rheumatoid arthritis.  She is on Humira 40 mg subcutaneous injections every 14 days, Rasuvo 20 mg sq injections once weekly, and folic acid 2 mg mouth daily.  Patient has not missed any doses of Humira or Rasuvo recently.  She continues to have recurrent flares responsive to prednisone.  She has been experiencing increased pain in both hands and both knee joints.  She states that the nocturnal pain in her knees has been severe.  She denies any swelling or warmth in her knee joints at this time.    Activities of Daily Living:  Patient reports morning stiffness for 20   minutes.   Patient Reports nocturnal pain.  Difficulty dressing/grooming: Denies Difficulty climbing stairs: Denies Difficulty getting out of chair: Denies Difficulty using hands for taps, buttons, cutlery, and/or writing: Reports  Review of Systems  Constitutional: Negative for fatigue.  HENT: Negative for mouth sores, mouth dryness and nose dryness.   Eyes: Negative for pain, visual disturbance and dryness.  Respiratory: Negative for shortness of breath and difficulty breathing.   Cardiovascular: Negative for chest pain and palpitations.  Gastrointestinal: Negative for blood in stool, constipation and diarrhea.  Endocrine: Negative for increased urination.  Genitourinary: Negative for painful urination.  Musculoskeletal: Positive for arthralgias, joint pain and morning stiffness. Negative for joint swelling, myalgias, muscle tenderness and myalgias.  Skin: Negative for color change, rash and hair loss.  Neurological: Negative for dizziness, headaches, memory loss and  weakness.  Hematological: Negative for bruising/bleeding tendency.  Psychiatric/Behavioral: Positive for sleep disturbance. Negative for confusion.    PMFS History:  Patient Active Problem List   Diagnosis Date Noted  . PVC (premature ventricular contraction) 01/02/2017  . Snoring 01/02/2017  . High risk medication use 10/24/2016  . Rheumatoid nodule, left elbow ([place]) 10/24/2016  . Rheumatoid nodule, right elbow ([place]) 10/24/2016  . Calcaneal spur of both feet 10/24/2016  . Dry mouth 10/24/2016  . Rheumatoid arthritis ([place]) 08/07/2016  . Hypertension, essential 08/07/2016    Past Medical History:  Diagnosis Date  . Arthritis    RA Dx at age 74  . Bell's palsy   . Chicken pox   . Hypertension   . PVC (premature ventricular contraction) 01/02/2017  . Rheumatoid arthritis ([place])   . Snoring 01/02/2017    Family History  Problem Relation Age of Onset  . Rheum arthritis Mother   . Diabetes Neg Hx   . Hypertension Neg Hx   . Hyperlipidemia Neg Hx   . Stroke Neg Hx   . Colon cancer Neg Hx    Past Surgical History:  Procedure Laterality Date  . CESAREAN SECTION    . FOOT SURGERY Left 04/2019   Social History   Social History Narrative  . Not on file   Immunization History  Administered Date(s) Administered  . Influenza Inj Mdck Quad Pf 11/28/2016  . Influenza,inj,Quad PF,6+ Mos 12/05/2015, 12/19/2017  . Tdap 09/26/2014     Objective: Vital Signs: BP 128/85 (BP Location: Right Arm,  Patient Position: Sitting, Cuff Size: Small)   Pulse 88   Resp 14   Ht 5' 2" (1.575 m)   Wt 136 lb 6.4 oz (61.9 kg)   BMI 24.95 kg/m    Physical Exam Vitals and nursing note reviewed.  Constitutional:      Appearance: She is well-developed.  HENT:     Head: Normocephalic and atraumatic.  Eyes:     Conjunctiva/sclera: Conjunctivae normal.  Pulmonary:     Effort: Pulmonary effort is normal.  Abdominal:     Palpations: Abdomen is soft.  Musculoskeletal:     Cervical back: Normal  range of motion.  Skin:    General: Skin is warm and dry.     Capillary Refill: Capillary refill takes less than 2 seconds.  Neurological:     Mental Status: She is alert and oriented to person, place, and time.  Psychiatric:        Behavior: Behavior normal.      Musculoskeletal Exam: C-spine, thoracic spine, and lumbar spine good ROM.  Shoulder joints have full ROM with discomfort bilaterally.  Elbow joints good ROM with no tenderness or inflammation.  Limited ROM of both wrist joints noted on exam.  Thickening over the ulnar styloid bilaterally.  Thickening of MCP joints as well as tenderness of several MCPs and PIP joints as described below.  Hip joints have good range of motion with no discomfort.  Knee joints have good range of motion with no warmth or effusion.  She has tenderness of both knee joints.  Tenderness and warmth of the right ankle joint was noted.  No tenderness of MTP joints.  CDAI Exam: CDAI Score: 14.2  Patient Global: 6 mm; Provider Global: 6 mm Swollen: 1 ; Tender: 14  Joint Exam 11/08/2019      Right  Left  Glenohumeral   Tender   Tender  MCP 1   Tender   Tender  MCP 2   Tender     MCP 3   Tender     PIP 2      Tender  PIP 3   Tender   Tender  PIP 4      Tender  PIP 5      Tender  Knee   Tender   Tender  Ankle  Swollen Tender        Investigation: No additional findings.  Imaging: No results found.  Recent Labs: Lab Results  Component Value Date   WBC 3.7 (L) 10/12/2019   HGB 14.0 10/12/2019   PLT 290 10/12/2019   NA 138 10/12/2019   K 4.1 10/12/2019   CL 104 10/12/2019   CO2 26 10/12/2019   GLUCOSE 98 10/12/2019   BUN 16 10/12/2019   CREATININE 0.59 10/12/2019   BILITOT 1.3 (H) 10/12/2019   ALKPHOS 79 01/02/2017   AST 25 10/12/2019   ALT 16 10/12/2019   PROT 7.2 10/12/2019   ALBUMIN 4.3 01/02/2017   CALCIUM 9.4 10/12/2019   GFRAA 120 10/12/2019   QFTBGOLDPLUS NEGATIVE 01/07/2019    Speciality Comments: No specialty comments  available.  Procedures:  No procedures performed Allergies: Patient has no known allergies.   Assessment / Plan:     Visit Diagnoses: Rheumatoid arthritis involving multiple sites with positive rheumatoid factor (HCC) - Positive RF, positive anti-CCP, elevated ESR, erosive disease with nodulosis: She has been experiencing recurrent flares of rheumatoid arthritis responsive to prednisone.  She presents today with increased pain and joint stiffness in both hands.  She  has tenderness of several MCP joints and PIP joints as described above.  She has been experiencing nocturnal pain in both knees.  She states the pain is severe in the middle of the night.  She also has tenderness and warmth of the right ankle joint.  She is currently on Humira 40 mg subcutaneous injections every 14 days, Rasuvo 20 mg subcutaneous injections once weekly, and folic acid 2 mg by mouth daily.  She has not missed any doses of Humira or Rasuvo recently.  She is due for both injections today and will be proceeding with both injections.  She has been experiencing recurrent flares over the past 1 year on the current treatment regimen.  She previously had an inadequate response to Enbrel.  She had interruption in therapy of Humira and Rasuvo in March 2021 due to undergoing hammertoe correction and first MTP bunionectomy by Dr. Doran Durand.  Since her interruption in therapy she has been experiencing increased pain and intermittent inflammation in multiple joints.  We will check a sed rate today.  Different treatment options were discussed today at length.  Indications, contraindications, potential side effects of Rinvoq were discussed.  She will take Rinvoq 15 mg 1 tablet by mouth daily.  She was advised to notify us if she cannot tolerate taking Rinvoq.  She will continue on Rasuvo and folic acid as prescribed.  A prednisone taper starting at 20 mg tapering by 5 mg every 4 days will be sent to the pharmacy. She will follow-up in the office in  6 weeks to assess her response to Rinvoq.- Plan: Sedimentation rate  Counseled patient that Rinvoq is a JAK inhibitor indicated for Rheumatoid Arthritis.  Counseled patient on purpose, proper use, and adverse effects of Rinvoq.    Reviewed the most common adverse effects including infection, diarrhea, headaches.  Also reviewed rare adverse effects such as bowel injury and the need to contact us if they develop stomach pain during treatment. Counseled on the increase risk of venous thrombosis. Reviewed with patient that there is the possibility of an increased risk of malignancy but it is not well understood if this increased risk is due to the medication or the disease state.  Instructed patient that medication should be held for infection and prior to surgery.  Advised patient to avoid live vaccines. Recommend annual influenza, Pneumovax 23, Prevnar 13, and Shingrix as indicated.  She has received the shingrix vaccination.   Reviewed importance of routine lab monitoring including lipid panel.  Standing orders placed. Provided patient with medication education material and answered all questions.  Patient consented to Rinvoq.  Will upload into patient's chart.  Will apply through patient's insurance and update when we receive a response.    Patient dose will be 15 mg daily.  Prescription will be sent to pharmacy pending lab results and insurance approval.  High risk medication use -We will be applying for Rinvoq 15 mg 1 tablet by mouth daily.  Consent was obtained today in the office on 11/08/2019.  She has all baseline immunosuppressive labs.  She is due to recheck CBC today.  Future order for TB gold was placed.  TB gold was negative on 01/07/2019.  She will return for lab work in 1 month and every 3 months while taking Rinvoq.  Standing orders for CBC and CMP are in place.  Plan: CBC with Differential/Platelet, QuantiFERON-TB Gold Plus She has received both COVID-19 vaccinations and was encouraged to  receive the third dose.  She was advised to  avoid taking NSAIDs and Tylenol 24 hours prior to the third dose.  She will hold Rasuvo and Rinvoq for 1 week after receiving the third dose.  She voiced understanding.  We discussed the importance of holding received on Rinvoq anytime she has an infection and to resume once the infection has completely cleared. She was advised to notify us if she develops the COVID-19 infection in order to receive the antibody infusion.  All questions were addressed and she voiced understanding.   Rheumatoid nodule, right elbow ([place]): Resolved.   Rheumatoid nodule, left elbow ([place]): Resolved.   Metatarsalgia of both feet: Resolved.   S/P bunionectomy: She had hammertoe correction surgery and first MTP bunionectomy performed by Dr. Doran Durand on 05/04/2019.  She is not having discomfort in her feet at this time.  No tenderness of MTP joints  Trigger little finger of left hand: Resolved   Calcaneal spur of both feet: She is not having any discomfort in her heels at this time.  Warmth and tenderness of the right ankle joint noted.   Trochanteric bursitis, left hip: She has intermittent discomfort.  Tenderness to palpation on exam.    History of hypertension: BP was 128/85 today.  Elevated LFTs: LFTs within normal limits on 10/12/2019  Chronic pain of both knees -She has been experiencing significant discomfort in both knee joints.  Her nocturnal pain has been severe.  X-rays of both knees were obtained today.  A prednisone taper starting at 20 mg tapering by 5 mg every 4 days was sent to the pharmacy.  Plan: XR KNEE 3 VIEW LEFT, XR KNEE 3 VIEW RIGHT  Orders: Orders Placed This Encounter  Procedures  . XR KNEE 3 VIEW LEFT  . XR KNEE 3 VIEW RIGHT  . CBC with Differential/Platelet  . QuantiFERON-TB Gold Plus  . Sedimentation rate   Meds ordered this encounter  Medications  . predniSONE (DELTASONE) 5 MG tablet    Sig: Take 4 tabs po qd x 4 days, 3  tabs po qd x 4  days, 2  tabs po qd x 4 days, 1  tab po qd x 4 days    Dispense:  40 tablet    Refill:  0     Follow-Up Instructions: Return in about 3 months (around 02/07/2020) for Rheumatoid arthritis, Osteoarthritis.   Ofilia Neas, PA-C  Note - This record has been created using Dragon software.  Chart creation errors have been sought, but may not always  have been located. Such creation errors do not reflect on  the standard of medical care.,

## 2019-10-29 NOTE — Telephone Encounter (Signed)
Patient scheduled for 11/08/2019 for evaluation.

## 2019-11-08 ENCOUNTER — Telehealth: Payer: Self-pay

## 2019-11-08 ENCOUNTER — Encounter: Payer: Self-pay | Admitting: Physician Assistant

## 2019-11-08 ENCOUNTER — Ambulatory Visit: Payer: Self-pay

## 2019-11-08 ENCOUNTER — Ambulatory Visit (INDEPENDENT_AMBULATORY_CARE_PROVIDER_SITE_OTHER): Payer: 59 | Admitting: Physician Assistant

## 2019-11-08 ENCOUNTER — Other Ambulatory Visit: Payer: Self-pay

## 2019-11-08 VITALS — BP 128/85 | HR 88 | Resp 14 | Ht 62.0 in | Wt 136.4 lb

## 2019-11-08 DIAGNOSIS — Z79899 Other long term (current) drug therapy: Secondary | ICD-10-CM | POA: Diagnosis not present

## 2019-11-08 DIAGNOSIS — M25561 Pain in right knee: Secondary | ICD-10-CM

## 2019-11-08 DIAGNOSIS — M06322 Rheumatoid nodule, left elbow: Secondary | ICD-10-CM | POA: Diagnosis not present

## 2019-11-08 DIAGNOSIS — M0579 Rheumatoid arthritis with rheumatoid factor of multiple sites without organ or systems involvement: Secondary | ICD-10-CM

## 2019-11-08 DIAGNOSIS — M25562 Pain in left knee: Secondary | ICD-10-CM | POA: Diagnosis not present

## 2019-11-08 DIAGNOSIS — M7732 Calcaneal spur, left foot: Secondary | ICD-10-CM

## 2019-11-08 DIAGNOSIS — G8929 Other chronic pain: Secondary | ICD-10-CM

## 2019-11-08 DIAGNOSIS — M65352 Trigger finger, left little finger: Secondary | ICD-10-CM

## 2019-11-08 DIAGNOSIS — Z9889 Other specified postprocedural states: Secondary | ICD-10-CM

## 2019-11-08 DIAGNOSIS — M06321 Rheumatoid nodule, right elbow: Secondary | ICD-10-CM | POA: Diagnosis not present

## 2019-11-08 DIAGNOSIS — M7062 Trochanteric bursitis, left hip: Secondary | ICD-10-CM

## 2019-11-08 DIAGNOSIS — M7731 Calcaneal spur, right foot: Secondary | ICD-10-CM

## 2019-11-08 DIAGNOSIS — R7989 Other specified abnormal findings of blood chemistry: Secondary | ICD-10-CM

## 2019-11-08 DIAGNOSIS — Z8679 Personal history of other diseases of the circulatory system: Secondary | ICD-10-CM

## 2019-11-08 DIAGNOSIS — M7741 Metatarsalgia, right foot: Secondary | ICD-10-CM

## 2019-11-08 DIAGNOSIS — M7742 Metatarsalgia, left foot: Secondary | ICD-10-CM

## 2019-11-08 MED ORDER — PREDNISONE 5 MG PO TABS: ORAL_TABLET | ORAL | 0 refills | Status: DC

## 2019-11-08 NOTE — Patient Instructions (Addendum)
Core Strength Exercises  Core exercises help to build strength in the muscles between your ribs and your hips (abdominal muscles). These muscles help to support your body and keep your spine stable. It is important to maintain strength in your core to prevent injury and pain. Some activities, such as yoga and Pilates, can help to strengthen core muscles. You can also strengthen core muscles with exercises at home. It is important to talk to your health care provider before you start a new exercise routine. What are the benefits of core strength exercises? Core strength exercises can:  Reduce back pain.  Help to rebuild strength after a back or spine injury.  Help to prevent injury during physical activity, especially injuries to the back and knees. How to do core strength exercises Repeat these exercises 10-15 times, or until you are tired. Do exercises exactly as told by your health care provider and adjust them as directed. It is normal to feel mild stretching, pulling, tightness, or discomfort as you do these exercises. If you feel any pain while doing these exercises, stop. If your pain continues or gets worse when doing core exercises, contact your health care provider. You may want to use a padded yoga or exercise mat for strength exercises that are done on the floor. Bridging  1. Lie on your back on a firm surface with your knees bent and your feet flat on the floor. 2. Raise your hips so that your knees, hips, and shoulders form a straight line together. Keep your abdominal muscles tight. 3. Hold this position for 3-5 seconds. 4. Slowly lower your hips to the starting position. 5. Let your muscles relax completely between repetitions. Single-leg bridge 1. Lie on your back on a firm surface with your knees bent and your feet flat on the floor. 2. Raise your hips so that your knees, hips, and shoulders form a straight line together. Keep your abdominal muscles tight. 3. Lift one foot  off the floor, then completely straighten that leg. 4. Hold this position for 3-5 seconds. 5. Put the straight leg back down in the bent position. 6. Slowly lower your hips to the starting position. 7. Repeat these steps using your other leg. Side bridge 1. Lie on your side with your knees bent. Prop yourself up on the elbow that is near the floor. 2. Using your abdominal muscles and your elbow that is on the floor, raise your body off the floor. Raise your hip so that your shoulder, hip, and foot form a straight line together. 3. Hold this position for 10 seconds. Keep your head and neck raised and away from your shoulder (in their normal, neutral position). Keep your abdominal muscles tight. 4. Slowly lower your hip to the starting position. 5. Repeat and try to hold this position longer, working your way up to 30 seconds. Abdominal crunch 1. Lie on your back on a firm surface. Bend your knees and keep your feet flat on the floor. 2. Cross your arms over your chest. 3. Without bending your neck, tip your chin slightly toward your chest. 4. Tighten your abdominal muscles as you lift your chest just high enough to lift your shoulder blades off of the floor. Do not hold your breath. You can do this with short lifts or long lifts. 5. Slowly return to the starting position. Bird dog 1. Get on your hands and knees, with your legs shoulder-width apart and your arms under your shoulders. Keep your back straight. 2. Tighten   your abdominal muscles. 3. Raise one of your legs off the floor and straighten it. Try to keep it parallel to the floor. 4. Slowly lower your leg to the starting position. 5. Raise one of your arms off the floor and straighten it. Try to keep it parallel to the floor. 6. Slowly lower your arm to the starting position. 7. Repeat with the other arm and leg. If possible, try raising a leg and arm at the same time, on opposite sides of the body. For example, raise your left hand and  your right leg. Plank 1. Lie on your belly. 2. Prop up your body onto your forearms and your feet, keeping your legs straight. Your body should make a straight line between your shoulders and feet. 3. Hold this position for 10 seconds while keeping your abdominal muscles tight. 4. Lower your body to the starting position. 5. Repeat and try to hold this position longer, working your way up to 30 seconds. Cross-core strengthening 1. Stand with your feet shoulder-width apart. 2. Hold a ball out in front of you. Keep your arms straight. 3. Tighten your abdominal muscles and slowly rotate at your waist from side to side. Keep your feet flat. 4. Once you are comfortable, try repeating this exercise with a heavier ball. Top core strengthening 1. Stand about 18 inches (46 cm) in front of a wall, with your back to the wall. 2. Keep your feet flat and shoulder-width apart. 3. Tighten your abdominal muscles. 4. Bend your hips and knees. 5. Slowly reach between your legs to touch the wall behind you. 6. Slowly stand back up. 7. Raise your arms over your head and reach behind you. 8. Return to the starting position. General tips  Do not do any exercises that cause pain. If you have pain while exercising, talk to your health care provider.  Always stretch before and after doing these exercises. This can help prevent injury.  Maintain a healthy weight. Ask your health care provider what weight is healthy for you. Contact a health care provider if:  You have back pain that gets worse or does not go away.  You feel pain while doing core strength exercises. Get help right away if:  You have severe pain that does not get better with medicine. Summary  Core exercises help to build strength in the muscles between your ribs and your waist.  Core muscles help to support your body and keep your spine stable.  Some activities, such as yoga and Pilates, can help to strengthen core muscles.  Core  strength exercises can help back pain and can prevent injury.  If you feel any pain while doing core strength exercises, stop. This information is not intended to replace advice given to you by your health care provider. Make sure you discuss any questions you have with your health care provider. Document Revised: 06/03/2018 Document Reviewed: 07/03/2016 Elsevier Patient Education  2020 Else[employer] Inc. Back Exercises These exercises help to make your trunk and back strong. They also help to keep the lower back flexible. Doing these exercises can help to prevent back pain or lessen existing pain.  If you have back pain, try to do these exercises 2-3 times each day or as told by your doctor.  As you get better, do the exercises once each day. Repeat the exercises more often as told by your doctor.  To stop back pain from coming back, do the exercises once each day, or as told by your   doctor. Exercises Single knee to chest Do these steps 3-5 times in a row for each leg: 1. Lie on your back on a firm bed or the floor with your legs stretched out. 2. Bring one knee to your chest. 3. Grab your knee or thigh with both hands and hold them it in place. 4. Pull on your knee until you feel a gentle stretch in your lower back or buttocks. 5. Keep doing the stretch for 10-30 seconds. 6. Slowly let go of your leg and straighten it. Pelvic tilt Do these steps 5-10 times in a row: 1. Lie on your back on a firm bed or the floor with your legs stretched out. 2. Bend your knees so they point up to the ceiling. Your feet should be flat on the floor. 3. Tighten your lower belly (abdomen) muscles to press your lower back against the floor. This will make your tailbone point up to the ceiling instead of pointing down to your feet or the floor. 4. Stay in this position for 5-10 seconds while you gently tighten your muscles and breathe evenly. Cat-cow Do these steps until your lower back bends more  easily: 1. Get on your hands and knees on a firm surface. Keep your hands under your shoulders, and keep your knees under your hips. You may put padding under your knees. 2. Let your head hang down toward your chest. Tighten (contract) the muscles in your belly. Point your tailbone toward the floor so your lower back becomes rounded like the back of a cat. 3. Stay in this position for 5 seconds. 4. Slowly lift your head. Let the muscles of your belly relax. Point your tailbone up toward the ceiling so your back forms a sagging arch like the back of a cow. 5. Stay in this position for 5 seconds.  Press-ups Do these steps 5-10 times in a row: 1. Lie on your belly (face-down) on the floor. 2. Place your hands near your head, about shoulder-width apart. 3. While you keep your back relaxed and keep your hips on the floor, slowly straighten your arms to raise the top half of your body and lift your shoulders. Do not use your back muscles. You may change where you place your hands in order to make yourself more comfortable. 4. Stay in this position for 5 seconds. 5. Slowly return to lying flat on the floor.  Bridges Do these steps 10 times in a row: 1. Lie on your back on a firm surface. 2. Bend your knees so they point up to the ceiling. Your feet should be flat on the floor. Your arms should be flat at your sides, next to your body. 3. Tighten your butt muscles and lift your butt off the floor until your waist is almost as high as your knees. If you do not feel the muscles working in your butt and the back of your thighs, slide your feet 1-2 inches farther away from your butt. 4. Stay in this position for 3-5 seconds. 5. Slowly lower your butt to the floor, and let your butt muscles relax. If this exercise is too easy, try doing it with your arms crossed over your chest. Belly crunches Do these steps 5-10 times in a row: 1. Lie on your back on a firm bed or the floor with your legs stretched  out. 2. Bend your knees so they point up to the ceiling. Your feet should be flat on the floor. 3. Cross your arms over   your chest. 4. Tip your chin a little bit toward your chest but do not bend your neck. 5. Tighten your belly muscles and slowly raise your chest just enough to lift your shoulder blades a tiny bit off of the floor. Avoid raising your body higher than that, because it can put too much stress on your low back. 6. Slowly lower your chest and your head to the floor. Back lifts Do these steps 5-10 times in a row: 1. Lie on your belly (face-down) with your arms at your sides, and rest your forehead on the floor. 2. Tighten the muscles in your legs and your butt. 3. Slowly lift your chest off of the floor while you keep your hips on the floor. Keep the back of your head in line with the curve in your back. Look at the floor while you do this. 4. Stay in this position for 3-5 seconds. 5. Slowly lower your chest and your face to the floor. Contact a doctor if:  Your back pain gets a lot worse when you do an exercise.  Your back pain does not get better 2 hours after you exercise. If you have any of these problems, stop doing the exercises. Do not do them again unless your doctor says it is okay. Get help right away if:  You have sudden, very bad back pain. If this happens, stop doing the exercises. Do not do them again unless your doctor says it is okay. This information is not intended to replace advice given to you by your health care provider. Make sure you discuss any questions you have with your health care provider. Document Revised: 11/06/2017 Document Reviewed: 11/06/2017 Elsevier Patient Education  2020 [employer].   COVID-19 vaccine recommendations:   COVID-19 vaccine is recommended for everyone (unless you are allergic to a vaccine component), even if you are on a medication that suppresses your immune system.   If you are on Methotrexate, Cellcept  (mycophenolate), Rinvoq, Harriette Ohara, and Olumiant- hold the medication for 1 week after each vaccine. Hold Methotrexate for 2 weeks after the single dose COVID-19 vaccine.   If you are on Orencia subcutaneous injection - hold medication one week prior to and one week after the first COVID-19 vaccine dose (only).   If you are on Orencia IV infusions- time vaccination administration so that the first COVID-19 vaccination will occur four weeks after the infusion and postpone the subsequent infusion by one week.   If you are on Cyclophosphamide or Rituxan infusions please contact your doctor prior to receiving the COVID-19 vaccine.   Do not take Tylenol or any anti-inflammatory medications (NSAIDs) 24 hours prior to the COVID-19 vaccination.   There is no direct evidence about the efficacy of the COVID-19 vaccine in individuals who are on medications that suppress the immune system.   Even if you are fully vaccinated, and you are on any medications that suppress your immune system, please continue to wear a mask, maintain at least six feet social distance and practice hand hygiene.   If you develop a COVID-19 infection, please contact your PCP or our office to determine if you need antibody infusion.  The booster vaccine is now available for immunocompromised patients. It is advised that if you had Pfizer vaccine you should get [employer] booster.  If you had a Moderna vaccine then you should get a Moderna booster. Johnson and Laural Benes does not have a booster vaccine at this time.  Please see the following web sites for  updated information.   https://[email or web address]/Portals/0/Files/[email or web address]-Vaccination-Patient-Resources.pdf  [email or web address]://[email or web address]/About-Us/Newsroom/Press-Releases/ID/1159   Upadacitinib extended-release tablets What is this medicine? UPADACITINIB (ue PAD a SYE ti nib) is a medicine that works on the immune system. This medicine is used to treat rheumatoid arthritis. This  medicine may be used for other purposes; ask your health care provider or pharmacist if you have questions. COMMON BRAND NAME(S): RINVOQ What should I tell my health care provider before I take this medicine? They need to know if you have any of these conditions:  cancer  diabetes  high cholesterol  HIV or AIDs  immune system problems  infection (especially a virus infection such as hepatitis B, chickenpox, cold sores, or herpes)  liver disease  low blood counts, like low white cell, platelets, or red cell counts  history of blood clots  lung or breathing disease, like asthma  organ transplant  stomach or intestine problems  tuberculosis, a positive skin test for tuberculosis, or have recently been in close contact with someone who has tuberculosis  an unusual or allergic reaction to upadacitinib, other medicines, foods, dyes or preservatives  pregnant or trying to get pregnant  breast-feeding How should I use this medicine? Take this medicine by mouth with a full glass of water. Follow the directions on the prescription label. Do not cut, crush, or chew this medicine. Swallow the tablets whole. You can take it with or without food. Take your medicine at regular intervals. Do not take more often then directed. Do not stop taking except on your doctor's advice. A special MedGuide will be given to you by the pharmacist with each prescription and refill. Be sure to read this information carefully each time. Talk to your pediatrician regarding the use of this medicine in children. Special care may be needed. Overdosage: If you think you have taken too much of this medicine contact a poison control center or emergency room at once. NOTE: This medicine is only for you. Do not share this medicine with others. What if I miss a dose? If you miss a dose, take it as soon as you can. If it is almost time for your next dose, take only that dose. Do not take double or extra doses. What  may interact with this medicine? Do not take this medicine with any of the following medications:  baricitinib  tofacitinib This medicine may also interact with the following medications:  azathioprine, cyclosporine, or other immunosuppressive drugs  biologic medicines such as abatacept, adalimumab, anakinra, certolizumab, etanercept, golimumab, infliximab, rituximab, secukinumab, tocilizumab, ustekinumab  certain medicines for fungal infections like ketoconazole, itraconazole, or posaconazole  certain medicines for seizures like carbamazepine, phenobarbital, phenytoin  clarithromycin  live vaccines  rifampin  supplements, such as St. John's wort This list may not describe all possible interactions. Give your health care provider a list of all the medicines, herbs, non-prescription drugs, or dietary supplements you use. Also tell them if you smoke, drink alcohol, or use illegal drugs. Some items may interact with your medicine. What should I watch for while using this medicine? Visit your healthcare professional for regular checks on your progress. Tell your healthcare professional if your symptoms do not start to get better or if they get worse. You may need blood work done while you are taking this medicine. Avoid taking products that contain aspirin, acetaminophen, ibuprofen, naproxen, or ketoprofen unless instructed by your doctor. These medicines may hide a fever. Call your doctor or healthcare professional for advice if you get a fever, chills or sore  throat or other symptoms of a cold or flu. Do not treat yourself. This drug decreases your body's ability to fight infections. Try to avoid being around people who are sick. Do not become pregnant while taking this medicine. Women should inform their healthcare professional if they wish to become pregnant or think they might be pregnant. There is potential for serious side effects and harm to an unborn child. Talk to your healthcare  professional for more information. Do not breast-feed an infant while taking this medicine or for 6 days after stopping it. What side effects may I notice from receiving this medicine? Side effects that you should report to your doctor or health care professional as soon as possible:  allergic reactions like skin rash, itching or hives, swelling of the face, lips, or tongue  breathing problems  signs and symptoms of a blood clot such as breathing problems; changes in vision; chest pain; severe, sudden headache; pain, swelling, warmth in the leg; trouble speaking; sudden numbness or weakness of the face, arm, or leg  sign and symptoms of infection like fever or chills; cough; sore throat; pain or trouble passing urine  signs and symptoms of liver injury like dark yellow or brown urine; general ill feeling or flu-like symptoms; light-colored stools; loss of appetite; nausea; right upper belly pain; unusually weak or tired; yellowing of the eyes or skin  stomach pain or a sudden change in bowel habits  unusually weak or tired Side effects that usually do not require medical attention (report these to your doctor or health care professional if they continue or are bothersome):  nausea  runny nose  sinus trouble This list may not describe all possible side effects. Call your doctor for medical advice about side effects. You may report side effects to FDA at 1-800-FDA-1088. Where should I keep my medicine? Keep out of the reach of children. Store between 2 and 25 degrees C (36 and 77 degrees F). Keep this medicine in the original container. Throw away any unused medicine after the expiration date. NOTE: This sheet is a summary. It may not cover all possible information. If you have questions about this medicine, talk to your doctor, pharmacist, or health care provider.  2020 Elsevier/Gold Standard (2017-10-15 01:45:07)    Standing Labs We placed an order today for your standing lab  work.   Please have your standing labs drawn in 1 month then every 3 months   If possible, please have your labs drawn 2 weeks prior to your appointment so that the provider can discuss your results at your appointment.  We have open lab daily Monday through Thursday from 8:30-12:30 PM and 1:30-4:30 PM and Friday from 8:30-12:30 PM and 1:30-4:00 PM at the office of Dr. Pollyann Savoy, [facility].   Please be advised, patients with office appointments requiring lab work will take precedents over walk-in lab work.  If possible, please come for your lab work on Monday and Friday afternoons, as you may experience shorter wait times. The office is located at [address], [address], [place], [place] [address] No appointment is necessary.   Labs are drawn by [facility]. Please bring your co-pay at the time of your lab draw.  You may receive a bill from [facility] for your lab work.  If you wish to have your labs drawn at another location, please call the office 24 hours in advance to send orders.  If you have any questions regarding directions or hours of operation,  please  call [phone].   As a reminder, please drink plenty of water prior to coming for your lab work. Thanks!

## 2019-11-08 NOTE — Telephone Encounter (Signed)
Submitted a Prior Authorization request to [facility] for [facility] via Cover My Meds. Will update once we receive a response.   (KeyGillis Ends) - [id number]  Patient added to [employer]- pending benefits

## 2019-11-08 NOTE — Telephone Encounter (Signed)
Please apply for rinvoq per Sherron Ales, PA-C.   Consent obtained and sent to the scan center. Also provided patient with rinvoq co-pay card.

## 2019-11-09 LAB — CBC WITH DIFFERENTIAL/PLATELET
Absolute Monocytes: 484 cells/uL (ref 200–950)
Basophils Absolute: 22 cells/uL (ref 0–200)
Basophils Relative: 0.4 %
Eosinophils Absolute: 61 cells/uL (ref 15–500)
Eosinophils Relative: 1.1 %
HCT: 40.5 % (ref 35.0–45.0)
Hemoglobin: 13.5 g/dL (ref 11.7–15.5)
Lymphs Abs: 1705 cells/uL (ref 850–3900)
MCH: 31.5 pg (ref 27.0–33.0)
MCHC: 33.3 g/dL (ref 32.0–36.0)
MCV: 94.4 fL (ref 80.0–100.0)
MPV: 10.5 fL (ref 7.5–12.5)
Monocytes Relative: 8.8 %
Neutro Abs: 3229 cells/uL (ref 1500–7800)
Neutrophils Relative %: 58.7 %
Platelets: 314 10*3/uL (ref 140–400)
RBC: 4.29 10*6/uL (ref 3.80–5.10)
RDW: 13.1 % (ref 11.0–15.0)
Total Lymphocyte: 31 %
WBC: 5.5 10*3/uL (ref 3.8–10.8)

## 2019-11-09 LAB — SEDIMENTATION RATE: Sed Rate: 36 mm/h — ABNORMAL HIGH (ref 0–30)

## 2019-11-09 NOTE — Telephone Encounter (Signed)
Received notification from [facility] regarding a prior authorization for [facility]. Authorization has been APPROVED from 11/08/19 to 11/07/20.   Authorization # [id number]  Per plan patient must fill through [facility].  Rinvoq copay card info:  Card: [id number]  Issued: 11/09/2019  Rx GROUP: [id number]  Rx BIN: [id number]  Rx PCN: [id number]  Suf: 01

## 2019-11-09 NOTE — Progress Notes (Signed)
CBC WNL. Please notify the patient that the WBC count has returned to WNL.  ESR is elevated-36.   She will be switching from Humira to Rinvoq.

## 2019-11-10 MED ORDER — RINVOQ 15 MG PO TB24: 15.0000 mg | ORAL_TABLET | Freq: Every day | ORAL | 0 refills | Status: DC

## 2019-11-10 NOTE — Addendum Note (Signed)
Addended by: Verlin Fester C on: 11/10/2019 08:39 AM   Modules accepted: Orders

## 2019-11-10 NOTE — Telephone Encounter (Signed)
Prescription sent to [facility] along with co-pay information.  Nothing further needed.   Verlin Fester, PharmD, [place], CPP Clinical Specialty Pharmacist (Rheumatology and Pulmonology)  11/10/2019 8:36 AM

## 2019-11-11 NOTE — Progress Notes (Signed)
Please call the patient with x-ray results.  I attempted to call her but had to leave a voicemail.

## 2019-11-18 ENCOUNTER — Telehealth: Payer: Self-pay | Admitting: Rheumatology

## 2019-11-18 NOTE — Telephone Encounter (Signed)
Advised patient Ok to start on Rinvoq and restart rasuvo on 11/22/19. Patient verbalized understanding.

## 2019-11-18 NOTE — Telephone Encounter (Signed)
Patient called stating she stopped her Humira and Rasuvo on 11/08/19 in preparation for her COVID booster on Friday, 11/12/19.  Patient is requesting a return call to let her know if she should start her Rinvoq and Rasuvo on Monday, 11/22/19.

## 2019-11-18 NOTE — Telephone Encounter (Signed)
Ok for the patient to start on Rinvoq and restart rasuvo on 11/22/19.

## 2019-12-14 ENCOUNTER — Ambulatory Visit: Payer: 59 | Admitting: Rheumatology

## 2019-12-15 ENCOUNTER — Other Ambulatory Visit: Payer: Self-pay | Admitting: Family Medicine

## 2019-12-15 DIAGNOSIS — I10 Essential (primary) hypertension: Secondary | ICD-10-CM

## 2019-12-27 ENCOUNTER — Other Ambulatory Visit (HOSPITAL_COMMUNITY)
Admission: RE | Admit: 2019-12-27 | Discharge: 2019-12-27 | Disposition: A | Discharge status: Home / Self Care | Payer: 59 | Source: Ambulatory Visit | Attending: Family Medicine | Admitting: Family Medicine

## 2019-12-27 ENCOUNTER — Other Ambulatory Visit: Payer: Self-pay

## 2019-12-27 ENCOUNTER — Encounter: Payer: Self-pay | Admitting: Family Medicine

## 2019-12-27 ENCOUNTER — Ambulatory Visit (INDEPENDENT_AMBULATORY_CARE_PROVIDER_SITE_OTHER): Payer: 59 | Admitting: Family Medicine

## 2019-12-27 VITALS — BP 116/80 | HR 70 | Temp 98.3°F | Resp 12 | Ht 62.0 in | Wt 133.5 lb

## 2019-12-27 DIAGNOSIS — R202 Paresthesia of skin: Secondary | ICD-10-CM | POA: Diagnosis not present

## 2019-12-27 DIAGNOSIS — Z130 Encounter for screening for diseases of the blood and blood-forming organs and certain disorders involving the immune mechanism: Secondary | ICD-10-CM

## 2019-12-27 DIAGNOSIS — R1013 Epigastric pain: Secondary | ICD-10-CM

## 2019-12-27 DIAGNOSIS — Z13228 Encounter for screening for other metabolic disorders: Secondary | ICD-10-CM

## 2019-12-27 DIAGNOSIS — Z1329 Encounter for screening for other suspected endocrine disorder: Secondary | ICD-10-CM | POA: Diagnosis not present

## 2019-12-27 DIAGNOSIS — E785 Hyperlipidemia, unspecified: Secondary | ICD-10-CM

## 2019-12-27 DIAGNOSIS — Z1159 Encounter for screening for other viral diseases: Secondary | ICD-10-CM

## 2019-12-27 DIAGNOSIS — Z0000 Encounter for general adult medical examination without abnormal findings: Secondary | ICD-10-CM | POA: Diagnosis not present

## 2019-12-27 DIAGNOSIS — I10 Essential (primary) hypertension: Secondary | ICD-10-CM

## 2019-12-27 DIAGNOSIS — Z124 Encounter for screening for malignant neoplasm of cervix: Secondary | ICD-10-CM | POA: Diagnosis not present

## 2019-12-27 DIAGNOSIS — Z13220 Encounter for screening for lipoid disorders: Secondary | ICD-10-CM

## 2019-12-27 MED ORDER — FAMOTIDINE 40 MG PO TABS: 40.0000 mg | ORAL_TABLET | Freq: Every day | ORAL | 2 refills | Status: AC

## 2019-12-27 NOTE — Patient Instructions (Signed)
Today you have you routine preventive visit. A few things to remember from today's visit:   Routine general medical examination at a health care facility  Encounter for HCV screening test for low risk patient - Plan: Hepatitis C antibody  Screening for lipoid disorders - Plan: Lipid panel  Screening for endocrine, metabolic and immunity disorder - Plan: Glucose, random  Cervical cancer screening - Plan: Cytology - PAP ([place])  If you need refills please call your pharmacy. Do not use My Chart to request refills or for acute issues that need immediate attention.   Please be sure medication list is accurate. If a new problem present, please set up appointment sooner than planned today.  At least 150 minutes of moderate exercise per week, daily brisk walking for 15-30 min is a good exercise option. Healthy diet low in saturated (animal) fats and sweets and consisting of fresh fruits and vegetables, lean meats such as fish and white chicken and whole grains.  These are some of recommendations for screening depending of age and risk factors:  - Vaccines:  Tdap vaccine every 10 years.  Shingles vaccine recommended at age 67, could be given after 55 years of age but not sure about insurance coverage.   Pneumonia vaccines: Pneumovax at 65. Sometimes Pneumovax is giving earlier if history of smoking, lung disease,diabetes,kidney disease among some.  Screening for diabetes at age 47 and every 3 years.  Cervical cancer prevention:  Pap smear starts at 55 years of age and continues periodically until 55 years old in low risk women. Pap smear every 3 years between 52 and 6 years old. Pap smear every 3-5 years between women 30 and older if pap smear negative and HPV screening negative.   -Breast cancer: Mammogram: There is disagreement between experts about when to start screening in low risk asymptomatic female but recent recommendations are to start screening at 48 and not later  than 55 years old , every 1-2 years and after 55 yo q 2 years. Screening is recommended until 55 years old but some women can continue screening depending of healthy issues.  Colon cancer screening: Has been recently changed to 55 yo. Insurance may not cover until you are 55 years old. Screening is recommended until 55 years old.  Cholesterol disorder screening at age 31 and every 3 years.  Also recommended:  1. Dental visit- Brush and floss your teeth twice daily; visit your dentist twice a year. 2. Eye doctor- Get an eye exam at least every 2 years. 3. Helmet use- Always wear a helmet when riding a bicycle, motorcycle, rollerblading or skateboarding. 4. Safe sex- If you may be exposed to sexually transmitted infections, use a condom. 5. Seat belts- Seat belts can save your live; always wear one. 6. Smoke/Carbon Monoxide detectors- These detectors need to be installed on the appropriate level of your home. Replace batteries at least once a year. 7. Skin cancer- When out in the sun please cover up and use sunscreen 15 SPF or higher. 8. Violence- If anyone is threatening or hurting you, please tell your healthcare provider.  9. Drink alcohol in moderation- Limit alcohol intake to one drink or less per day. Never drink and drive. 10. Calcium supplementation 1000 to 1200 mg daily, ideally through your diet.  Vitamin D supplementation 800 units daily.

## 2019-12-27 NOTE — Progress Notes (Signed)
HPI: Vickie [name] is a 54 y.o. female, who is here today for her routine physical.  Last CPE: 10/31/17.  Regular exercise 3 or more time per week: "Not much", planning on starting this week. Following a healthy diet: In general her diet is pretty good. She lives with her husband and son.  Chronic medical problems: HTN,RA,HLD,and OSA among some.  Pap smear:12/10/2016. Postmenopausal. G:2P:2  Immunization History  Administered Date(s) Administered  . Influenza Inj Mdck Quad Pf 11/28/2016  . Influenza Split 12/17/2019  . Influenza,inj,Quad PF,6+ Mos 12/05/2015, 12/19/2017, 11/12/2018  . PFIZER SARS-COV-2 Vaccination 05/10/2019, 06/07/2019, 11/12/2019  . Tdap 09/26/2014  . Zoster Recombinat (Shingrix) 11/12/2018, 03/25/2019   Mammogram: 02/23/19 Colonoscopy: 04/04/17. DEXA: N/A Hep C screening: Never.  Concerns today:  "Digestive issue" for a few weeks. Epigastric "heavy ball" sensation. Dull/ache, it has been 10/10, 6-8/10 most of the time. Intermittently. ? Acidic food, not sure. No heartburn, + burping. She is eating light meals and seems to help. No N/V or melena. She had same problem occasionally but now it is persistent. No dietary changes.  Daily owel movements. New med for RA from rheumatologist.  Right upper back burning and tingling sensation for several weeks. She has not noted rash,edema,or skin changes on affected area.  HTN: She is on Amlodipine 5 mg daily.  Lab Results  Component Value Date   CREATININE 0.59 10/12/2019   BUN 16 10/12/2019   NA 138 10/12/2019   K 4.1 10/12/2019   CL 104 10/12/2019   CO2 26 10/12/2019   HLD: Elevated TG. She is on non pharmacologic treatment. Component     Latest Ref Rng & Units 10/31/2017  Cholesterol     <200 mg/dL 354  Triglycerides     <150 mg/dL 562.5 (H)  HDL Cholesterol     > OR = 50 mg/dL 63.89  VLDL     0.0 - 37.3 mg/dL 42.8  LDL (calc)     0 - 99 mg/dL 90  Total CHOL/HDL Ratio     <5.0  (calc) 3  NonHDL      121.14    Review of Systems  Constitutional: Negative for appetite change and fever.  HENT: Negative for dental problem, hearing loss, mouth sores and sore throat.   Eyes: Negative for redness and visual disturbance.  Respiratory: Negative for cough, shortness of breath and wheezing.   Cardiovascular: Negative for chest pain and leg swelling.  Gastrointestinal:       No changes in bowel habits.  Endocrine: Negative for cold intolerance, heat intolerance, polydipsia, polyphagia and polyuria.  Genitourinary: Negative for decreased urine volume, dysuria, hematuria, vaginal bleeding and vaginal discharge.  Musculoskeletal: Positive for arthralgias. Negative for gait problem and myalgias.  Skin: Negative for color change and rash.  Allergic/Immunologic: Negative for environmental allergies.  Neurological: Negative for syncope, weakness and headaches.  Hematological: Negative for adenopathy. Does not bruise/bleed easily.  Psychiatric/Behavioral: Negative for confusion and sleep disturbance. The patient is not nervous/anxious.   All other systems reviewed and are negative.  Current Outpatient Medications on File Prior to Visit  Medication Sig Dispense Refill  . amLODipine (NORVASC) 5 MG tablet TAKE 1 TABLET BY MOUTH  DAILY 90 tablet 3  . diclofenac sodium (VOLTAREN) 1 % GEL Apply 2 g to 4 g to affected joints up to 4 times daily PRN 4 Tube 2  . folic acid (FOLVITE) 1 MG tablet Take 2 tablets (2 mg total) by mouth daily. 180 tablet 0  .  Methotrexate, PF, (RASUVO) 20 MG/0.4ML SOAJ INJECT SUBCUTANEOUSLY 20MG   ONCE WEEKLY 4.8 mL 3  . Upadacitinib ER (RINVOQ) 15 MG TB24 Take 15 mg by mouth daily. 90 tablet 0   No current facility-administered medications on file prior to visit.   Past Medical History:  Diagnosis Date  . Arthritis    RA Dx at age 89  . Bell's palsy   . Chicken pox   . Hypertension   . PVC (premature ventricular contraction) 01/02/2017  . Rheumatoid  arthritis (HCC)   . Snoring 01/02/2017    Past Surgical History:  Procedure Laterality Date  . CESAREAN SECTION    . FOOT SURGERY Left 04/2019    No Known Allergies  Family History  Problem Relation Age of Onset  . Rheum arthritis Mother   . Diabetes Neg Hx   . Hypertension Neg Hx   . Hyperlipidemia Neg Hx   . Stroke Neg Hx   . Colon cancer Neg Hx     Social History   Socioeconomic History  . Marital status: Married    Spouse name: Not on file  . Number of children: Not on file  . Years of education: Not on file  . Highest education level: Not on file  Occupational History  . Not on file  Tobacco Use  . Smoking status: Never Smoker  . Smokeless tobacco: Never Used  Vaping Use  . Vaping Use: Never used  Substance and Sexual Activity  . Alcohol use: Yes    Comment: occ  . Drug use: No  . Sexual activity: Not on file  Other Topics Concern  . Not on file  Social History Narrative  . Not on file   Social Determinants of Health   Financial Resource Strain:   . Difficulty of Paying Living Expenses: Not on file  Food Insecurity:   . Worried About Programme researcher, broadcasting/film/video in the Last Year: Not on file  . Ran Out of Food in the Last Year: Not on file  Transportation Needs:   . Lack of Transportation (Medical): Not on file  . Lack of Transportation (Non-Medical): Not on file  Physical Activity:   . Days of Exercise per Week: Not on file  . Minutes of Exercise per Session: Not on file  Stress:   . Feeling of Stress : Not on file  Social Connections:   . Frequency of Communication with Friends and Family: Not on file  . Frequency of Social Gatherings with Friends and Family: Not on file  . Attends Religious Services: Not on file  . Active Member of Clubs or Organizations: Not on file  . Attends Banker Meetings: Not on file  . Marital Status: Not on file    Vitals:   12/27/19 0902  BP: 116/80  Pulse: 70  Resp: 12  Temp: 98.3 F (36.8 C)  SpO2:  98%   Body mass index is 24.42 kg/m.  Wt Readings from Last 3 Encounters:  12/27/19 133 lb 8 oz (60.6 kg)  11/08/19 136 lb 6.4 oz (61.9 kg)  09/08/19 132 lb (59.9 kg)   Physical Exam Vitals and nursing note reviewed. Exam conducted with a chaperone present.  Constitutional:      General: She is not in acute distress.    Appearance: She is well-developed and normal weight.  HENT:     Head: Normocephalic and atraumatic.     Right Ear: Hearing and external ear normal.     Left Ear: Hearing and  external ear normal.     Ears:     Comments: Cerumen excess bilateral,TM seen partially.    Mouth/Throat:     Mouth: Mucous membranes are moist.     Pharynx: Oropharynx is clear. Uvula midline.  Eyes:     Extraocular Movements: Extraocular movements intact.     Conjunctiva/sclera: Conjunctivae normal.     Pupils: Pupils are equal, round, and reactive to light.  Neck:     Thyroid: No thyromegaly.     Trachea: No tracheal deviation.  Cardiovascular:     Rate and Rhythm: Normal rate and regular rhythm.     Pulses:          Dorsalis pedis pulses are 2+ on the right side and 2+ on the left side.     Heart sounds: No murmur heard.   Pulmonary:     Effort: Pulmonary effort is normal. No respiratory distress.     Breath sounds: Normal breath sounds.  Abdominal:     Palpations: Abdomen is soft. There is no hepatomegaly or mass.     Tenderness: There is no abdominal tenderness.  Genitourinary:    Labia:        Right: No rash, tenderness or lesion.        Left: No rash, tenderness or lesion.      Vagina: No vaginal discharge, erythema, tenderness or lesions.     Cervix: No cervical motion tenderness, discharge or friability.     Uterus: Not enlarged and not tender.      Adnexa:        Right: No mass, tenderness or fullness.         Left: No mass, tenderness or fullness.       Comments: Breast: No masses, skin abnormalities, or nipple discharge appreciated bilateral. Pap smear  collected. Musculoskeletal:       Back:     Comments: No signs of synovitis appreciated.  Lymphadenopathy:     Cervical: No cervical adenopathy.     Upper Body:     Right upper body: No supraclavicular or axillary adenopathy.     Left upper body: No supraclavicular or axillary adenopathy.     Lower Body: No right inguinal adenopathy. No left inguinal adenopathy.  Skin:    General: Skin is warm.     Findings: No erythema or rash.  Neurological:     General: No focal deficit present.     Mental Status: She is alert and oriented to person, place, and time.     Cranial Nerves: No cranial nerve deficit.     Coordination: Coordination normal.     Gait: Gait normal.     Deep Tendon Reflexes:     Reflex Scores:      Bicep reflexes are 2+ on the right side and 2+ on the left side.      Patellar reflexes are 2+ on the right side and 2+ on the left side. Psychiatric:        Mood and Affect: Mood and affect normal.     Comments: Well groomed, good eye contact.   ASSESSMENT AND PLAN:  Vickie [name] was here today annual physical examination.  Orders Placed This Encounter  Procedures  . Hepatitis C antibody  . Lipid panel  . Glucose, random   Lab Results  Component Value Date   CHOL 210 (H) 12/27/2019   HDL 88 12/27/2019   LDLCALC 104 (H) 12/27/2019   TRIG 90 12/27/2019   CHOLHDL 2.4 12/27/2019  Routine general medical examination at a health care facility We discussed the importance of regular physical activity and healthy diet for prevention of chronic illness and/or complications.Planning on starting exercising regularly Preventive guidelines reviewed. Vaccination up to date.  Ca++ and vit D supplementation recommended. Next CPE in a year.  The 10-year ASCVD risk score Denman George DC Montez Hageman., et al., 2013) is: 1.5%   Values used to calculate the score:     Age: 41 years     Sex: Female     Is Non-Hispanic African American: No     Diabetic: No     Tobacco smoker: No      Systolic Blood Pressure: 116 mmHg     Is BP treated: Yes     HDL Cholesterol: 88 mg/dL     Total Cholesterol: 210 mg/dL  Encounter for HCV screening test for low risk patient -     Hepatitis C antibody  Hyperlipidemia, unspecified hyperlipidemia type Continue non pharmacologic treatment. Further recommendations according to 10 years CVD risk and lipid numbers.  Screening for endocrine, metabolic and immunity disorder -     Glucose, random  Cervical cancer screening -     Cytology - PAP ([place])  Epigastric abdominal pain Possible etiologies discussed. ? Dyspepsia/gastritis. Trial of Pepcid 40 mg at bedtime recommended. GERD precautions.  -     famotidine (PEPCID) 40 MG tablet; Take 1 tablet (40 mg total) by mouth daily.  Tingling Small area affected, right-sided upper thoracic back.  Recommend OTC icy hot on affected area. Continue monitoring for changes, if worse or persistent thoracic MRI may be necessary.  Hypertension, essential BP adequately controlled. Continue Amlodipine 5 mg daily and low salt diet.   Return in 1 year (on 12/26/2020).   Matisse Roskelley G. [place], MD  [facility]. [place] office.    Today you have you routine preventive visit. A few things to remember from today's visit:   Routine general medical examination at a health care facility  Encounter for HCV screening test for low risk patient - Plan: Hepatitis C antibody  Screening for lipoid disorders - Plan: Lipid panel  Screening for endocrine, metabolic and immunity disorder - Plan: Glucose, random  Cervical cancer screening - Plan: Cytology - PAP ([place])  If you need refills please call your pharmacy. Do not use My Chart to request refills or for acute issues that need immediate attention.   Please be sure medication list is accurate. If a new problem present, please set up appointment sooner than planned today.  At least 150 minutes of moderate exercise per week,  daily brisk walking for 15-30 min is a good exercise option. Healthy diet low in saturated (animal) fats and sweets and consisting of fresh fruits and vegetables, lean meats such as fish and white chicken and whole grains.  These are some of recommendations for screening depending of age and risk factors:  - Vaccines:  Tdap vaccine every 10 years.  Shingles vaccine recommended at age 65, could be given after 55 years of age but not sure about insurance coverage.   Pneumonia vaccines: Pneumovax at 65. Sometimes Pneumovax is giving earlier if history of smoking, lung disease,diabetes,kidney disease among some.  Screening for diabetes at age 61 and every 3 years.  Cervical cancer prevention:  Pap smear starts at 55 years of age and continues periodically until 55 years old in low risk women. Pap smear every 3 years between 48 and 41 years old. Pap smear every 3-5 years between women  30 and older if pap smear negative and HPV screening negative.   -Breast cancer: Mammogram: There is disagreement between experts about when to start screening in low risk asymptomatic female but recent recommendations are to start screening at 21 and not later than 55 years old , every 1-2 years and after 55 yo q 2 years. Screening is recommended until 55 years old but some women can continue screening depending of healthy issues.  Colon cancer screening: Has been recently changed to 55 yo. Insurance may not cover until you are 55 years old. Screening is recommended until 55 years old.  Cholesterol disorder screening at age 17 and every 3 years.  Also recommended:  1. Dental visit- Brush and floss your teeth twice daily; visit your dentist twice a year. 2. Eye doctor- Get an eye exam at least every 2 years. 3. Helmet use- Always wear a helmet when riding a bicycle, motorcycle, rollerblading or skateboarding. 4. Safe sex- If you may be exposed to sexually transmitted infections, use a condom. 5. Seat belts-  Seat belts can save your live; always wear one. 6. Smoke/Carbon Monoxide detectors- These detectors need to be installed on the appropriate level of your home. Replace batteries at least once a year. 7. Skin cancer- When out in the sun please cover up and use sunscreen 15 SPF or higher. 8. Violence- If anyone is threatening or hurting you, please tell your healthcare provider.  9. Drink alcohol in moderation- Limit alcohol intake to one drink or less per day. Never drink and drive. 10. Calcium supplementation 1000 to 1200 mg daily, ideally through your diet.  Vitamin D supplementation 800 units daily.

## 2019-12-28 LAB — HEPATITIS C ANTIBODY
Hepatitis C Ab: NONREACTIVE
SIGNAL TO CUT-OFF: 0.04 (ref <1.00)

## 2019-12-28 LAB — LIPID PANEL
Cholesterol: 210 mg/dL — ABNORMAL HIGH (ref <200)
HDL: 88 mg/dL (ref >=50)
LDL Cholesterol (Calc): 104 mg/dL (calc) — ABNORMAL HIGH
Non-HDL Cholesterol (Calc): 122 mg/dL (calc) (ref <130)
Total CHOL/HDL Ratio: 2.4 (calc) (ref <5.0)
Triglycerides: 90 mg/dL (ref <150)

## 2019-12-28 LAB — CYTOLOGY - PAP
Comment: NEGATIVE
Diagnosis: NEGATIVE
High risk HPV: NEGATIVE

## 2019-12-28 LAB — GLUCOSE, RANDOM: Glucose, Plasma: 103 mg/dL (ref 65–139)

## 2019-12-29 ENCOUNTER — Telehealth: Payer: Self-pay | Admitting: *Deleted

## 2019-12-29 NOTE — Telephone Encounter (Signed)
It is okay for her to take Pepcid.

## 2019-12-29 NOTE — Telephone Encounter (Signed)
Patient states she was prescribed Pepcid from her PCP and wanted to verify that it will not interfere with her Rasuvo or Rinvoq. Please advise.

## 2019-12-29 NOTE — Telephone Encounter (Signed)
Attempted to contact the patient and left message to advise patient it is okay for her to take Pepcid.

## 2020-01-23 ENCOUNTER — Other Ambulatory Visit: Payer: Self-pay | Admitting: Pharmacist

## 2020-01-23 DIAGNOSIS — M0579 Rheumatoid arthritis with rheumatoid factor of multiple sites without organ or systems involvement: Secondary | ICD-10-CM

## 2020-01-24 NOTE — Progress Notes (Signed)
Office Visit Note  Patient: Vickie [name]             Date of Birth: Apr 05, 1965           MRN: [id number]             PCP: Martinique, Betty G, MD Referring: Martinique, Betty G, MD Visit Date: 02/07/2020 Occupation: '@GUAROCC' @  Subjective:  Medication monitoring   History of Present Illness: Vickie [name] is a 54 y.o. female  With history of seropositive rheumatoid arthritis.  She is taking rinvoq 15 mg and Rasuvo 20 mg sq injections once weekly.  She denies any recent rheumatoid arthritis flares.  She denies any joint pain or joint swelling at this time.  She has not had any morning stiffness or nocturnal pain.  She denies any difficulty with ADLs.  She has noticed a significant improvement in her joint pain and inflammation since starting on Rinvoq.  She is tolerating Rinvoq without any side effects.  She has been experiencing some nausea after the weekly Rasuvo injections as well as bruising of the injection site.  She has not missed any doses of Rinvoq or Rasuvo recently.  She denies any recent infections.  She has received all 3 covid-19 vaccine doses and the shingrix vaccine.   Activities of Daily Living:  Patient reports morning stiffness for 0 minutes.   Patient Denies nocturnal pain.  Difficulty dressing/grooming: Denies Difficulty climbing stairs: Denies Difficulty getting out of chair: Denies Difficulty using hands for taps, buttons, cutlery, and/or writing: Denies  Review of Systems  Constitutional: Negative for fatigue.  HENT: Negative for mouth sores, mouth dryness and nose dryness.   Eyes: Negative for pain, itching and dryness.  Respiratory: Negative for shortness of breath and difficulty breathing.   Cardiovascular: Negative for chest pain and palpitations.  Gastrointestinal: Negative for blood in stool, constipation and diarrhea.  Endocrine: Negative for increased urination.  Genitourinary: Negative for difficulty urinating.  Musculoskeletal: Negative for arthralgias,  joint pain, joint swelling, myalgias, morning stiffness, muscle tenderness and myalgias.  Skin: Negative for color change, rash and redness.  Allergic/Immunologic: Negative for susceptible to infections.  Neurological: Negative for dizziness, numbness, headaches, memory loss and weakness.  Hematological: Negative for bruising/bleeding tendency.  Psychiatric/Behavioral: Negative for confusion.    PMFS History:  Patient Active Problem List   Diagnosis Date Noted  . PVC (premature ventricular contraction) 01/02/2017  . Snoring 01/02/2017  . High risk medication use 10/24/2016  . Rheumatoid nodule, left elbow ([place]) 10/24/2016  . Rheumatoid nodule, right elbow ([place]) 10/24/2016  . Calcaneal spur of both feet 10/24/2016  . Rheumatoid arthritis ([place]) 08/07/2016  . Hypertension, essential 08/07/2016    Past Medical History:  Diagnosis Date  . Arthritis    RA Dx at age 35  . Bell's palsy   . Chicken pox   . Hypertension   . PVC (premature ventricular contraction) 01/02/2017  . Rheumatoid arthritis ([place])   . Snoring 01/02/2017    Family History  Problem Relation Age of Onset  . Rheum arthritis Mother   . Diabetes Neg Hx   . Hypertension Neg Hx   . Hyperlipidemia Neg Hx   . Stroke Neg Hx   . Colon cancer Neg Hx    Past Surgical History:  Procedure Laterality Date  . CESAREAN SECTION    . FOOT SURGERY Left 04/2019   Social History   Social History Narrative  . Not on file   Immunization History  Administered Date(s) Administered  .  Influenza Inj Mdck Quad Pf 11/28/2016  . Influenza Split 12/17/2019  . Influenza,inj,Quad PF,6+ Mos 12/05/2015, 12/19/2017, 11/12/2018  . PFIZER SARS-COV-2 Vaccination 05/10/2019, 06/07/2019, 11/12/2019  . Tdap 09/26/2014  . Zoster Recombinat (Shingrix) 11/12/2018, 03/25/2019     Objective: Vital Signs: BP 104/73 (BP Location: Left Arm, Patient Position: Sitting, Cuff Size: Normal)   Pulse 73   Resp 15   Ht '5\' 1"'  (1.549 m)   Wt 136 lb 3.2  oz (61.8 kg)   BMI 25.73 kg/m    Physical Exam Vitals and nursing note reviewed.  Constitutional:      Appearance: She is well-developed and well-nourished.  HENT:     Head: Normocephalic and atraumatic.  Eyes:     Extraocular Movements: EOM normal.     Conjunctiva/sclera: Conjunctivae normal.  Cardiovascular:     Pulses: Intact distal pulses.  Pulmonary:     Effort: Pulmonary effort is normal.  Abdominal:     Palpations: Abdomen is soft.  Musculoskeletal:     Cervical back: Normal range of motion.  Skin:    General: Skin is warm and dry.     Capillary Refill: Capillary refill takes less than 2 seconds.  Neurological:     Mental Status: She is alert and oriented to person, place, and time.  Psychiatric:        Mood and Affect: Mood and affect normal.        Behavior: Behavior normal.      Musculoskeletal Exam: C-spine, thoracic spine, and lumbar spine good ROM.  No midline spinal tenderness.  No SI joint tenderness.  Shoulder joints and elbow joints good ROM with no discomfort.  Limited ROM of both wrist joints with synovial thickening but no tenderness or inflammation. Thickening of MCP joints but no tenderness or synovitis noted. Hip joints good ROM with no discomfort. No tenderness over trochanteric bursa bilaterally.  Knee joints good ROM with no warmth or effusion.  Ankle joints good ROM with no tenderness or inflammation.   CDAI Exam: CDAI Score: 0  Patient Global: 0 mm; Provider Global: 0 mm Swollen: 0 ; Tender: 0  Joint Exam 02/07/2020   No joint exam has been documented for this visit   There is currently no information documented on the homunculus. Go to the Rheumatology activity and complete the homunculus joint exam.  Investigation: No additional findings.  Imaging: No results found.  Recent Labs: Lab Results  Component Value Date   WBC 5.5 11/08/2019   HGB 13.5 11/08/2019   PLT 314 11/08/2019   NA 138 10/12/2019   K 4.1 10/12/2019   CL 104  10/12/2019   CO2 26 10/12/2019   GLUCOSE 98 10/12/2019   BUN 16 10/12/2019   CREATININE 0.59 10/12/2019   BILITOT 1.3 (H) 10/12/2019   ALKPHOS 79 01/02/2017   AST 25 10/12/2019   ALT 16 10/12/2019   PROT 7.2 10/12/2019   ALBUMIN 4.3 01/02/2017   CALCIUM 9.4 10/12/2019   GFRAA 120 10/12/2019   QFTBGOLDPLUS NEGATIVE 01/07/2019    Speciality Comments: No specialty comments available.  Procedures:  No procedures performed Allergies: Latex and Sulfa antibiotics   Assessment / Plan:     Visit Diagnoses: Rheumatoid arthritis involving multiple sites with positive rheumatoid factor (HCC) - Positive RF, positive anti-CCP, elevated ESR, erosive disease with nodulosis: She has no joint tenderness or synovitis on examination today.  She has not had any recent rheumatoid arthritis flares.  She is clinically doing well on Rinvoq 15 mg 1 tablet  by mouth daily and Rasuvo 20 mg sq injections once weekly.  She was started on Rinvoq after her last office visit on 11/08/2019.  She has noticed a significant clinical improvement since starting on Rinvoq in combination with Rasuvo.  She has not missed any doses of these medications recently.  She has been experiencing some nausea and bruising at the injection site after the weekly Rasuvo dose.  She declined a prescription for Zofran at this time.  She is not experiencing any joint pain, swelling, morning stiffness, or nocturnal pain at this time.  She has not had any difficulty with ADLs.  She will continue on the current treatment regimen.  She was advised to notify us if she develops increased joint pain or joint swelling.  She will follow-up in the office in 5 months.  High risk medication use - Rinvoq 15 mg 1 tablet by mouth daily and rasuvo 20 mg sq injections once weekly. Discontinue humira-inadequate response.  CBC drawn on 11/08/2019.  CMP drawn on 10/12/2019.  She is overdue to update lab work.  Orders for CBC and CMP were released.  Her next lab work will  be due in March and every 3 months to monitor for drug toxicity.  TB gold was negative on 01/07/2019.  Order for TB gold was released today.- Plan: CBC with Differential/Platelet, COMPLETE METABOLIC PANEL WITH GFR She has not had any recent infections.  She is aware that she is to hold Rinvoq and Rasuvo if she develops signs or symptoms of an infection and to resume once the infection has completely cleared.  She has received all 3 COVID-19 vaccination doses as well as the Shingrix vaccine.  Screening for tuberculosis -Order for TB gold released today.  Plan: QuantiFERON-TB Gold Plus  Rheumatoid nodule, right elbow (HCC):Resolved   Rheumatoid nodule, left elbow ([place]): Resolved   Metatarsalgia of both feet: Resolved   S/P bunionectomy: Doing well.  She is not having any discomfort in her feet at this time.  She wears proper fitting shoes.   Trigger little finger of left hand: Resolved   Calcaneal spur of both feet: She is not experiencing any discomfort in her feet at this time.   Trochanteric bursitis, left hip: Resolved.  No tenderness to palpation/   History of hypertension: BP was 104/73 today.   Elevated LFTs: WNL on 10/12/19.  Order for CMP released today.     Orders: Orders Placed This Encounter  Procedures  . CBC with Differential/Platelet  . COMPLETE METABOLIC PANEL WITH GFR  . QuantiFERON-TB Gold Plus   No orders of the defined types were placed in this encounter.     Follow-Up Instructions: Return in about 5 months (around 07/07/2020) for Rheumatoid arthritis.   Ofilia Neas, PA-C  Note - This record has been created using Dragon software.  Chart creation errors have been sought, but may not always  have been located. Such creation errors do not reflect on  the standard of medical care.

## 2020-01-24 NOTE — Telephone Encounter (Addendum)
Last Visit: 11/08/2019 Next Visit: 02/07/2020 Labs: 10/12/2019 Total bilirubin is borderline elevated. Rest of CMP WNL.   WBC count is borderline low-3.7 Rest of CBC WNL.  TB Gold: 01/07/2019 Neg    Current Dose per office note 11/08/2019: Rinvoq 15 mg 1 tablet by mouth daily  DX: Rheumatoid arthritis involving multiple sites with positive rheumatoid factor   Patient advised she is due to update labs. Patient states she will try to update this week.  Okay to refill 30 day supply Rinvoq?

## 2020-01-24 NOTE — Telephone Encounter (Signed)
Routing refill request

## 2020-02-07 ENCOUNTER — Other Ambulatory Visit: Payer: Self-pay

## 2020-02-07 ENCOUNTER — Ambulatory Visit (INDEPENDENT_AMBULATORY_CARE_PROVIDER_SITE_OTHER): Payer: 59 | Admitting: Physician Assistant

## 2020-02-07 ENCOUNTER — Encounter: Payer: Self-pay | Admitting: Physician Assistant

## 2020-02-07 VITALS — BP 104/73 | HR 73 | Resp 15 | Ht 61.0 in | Wt 136.2 lb

## 2020-02-07 DIAGNOSIS — M7732 Calcaneal spur, left foot: Secondary | ICD-10-CM

## 2020-02-07 DIAGNOSIS — M06322 Rheumatoid nodule, left elbow: Secondary | ICD-10-CM | POA: Diagnosis not present

## 2020-02-07 DIAGNOSIS — M06321 Rheumatoid nodule, right elbow: Secondary | ICD-10-CM

## 2020-02-07 DIAGNOSIS — M7062 Trochanteric bursitis, left hip: Secondary | ICD-10-CM

## 2020-02-07 DIAGNOSIS — Z9889 Other specified postprocedural states: Secondary | ICD-10-CM

## 2020-02-07 DIAGNOSIS — R7989 Other specified abnormal findings of blood chemistry: Secondary | ICD-10-CM

## 2020-02-07 DIAGNOSIS — M0579 Rheumatoid arthritis with rheumatoid factor of multiple sites without organ or systems involvement: Secondary | ICD-10-CM

## 2020-02-07 DIAGNOSIS — Z79899 Other long term (current) drug therapy: Secondary | ICD-10-CM | POA: Diagnosis not present

## 2020-02-07 DIAGNOSIS — M65352 Trigger finger, left little finger: Secondary | ICD-10-CM

## 2020-02-07 DIAGNOSIS — M7731 Calcaneal spur, right foot: Secondary | ICD-10-CM

## 2020-02-07 DIAGNOSIS — M7742 Metatarsalgia, left foot: Secondary | ICD-10-CM

## 2020-02-07 DIAGNOSIS — Z111 Encounter for screening for respiratory tuberculosis: Secondary | ICD-10-CM

## 2020-02-07 DIAGNOSIS — Z8679 Personal history of other diseases of the circulatory system: Secondary | ICD-10-CM

## 2020-02-07 DIAGNOSIS — M7741 Metatarsalgia, right foot: Secondary | ICD-10-CM

## 2020-02-07 NOTE — Patient Instructions (Signed)
Standing Labs We placed an order today for your standing lab work.   Please have your standing labs drawn in March and every 3 months    If possible, please have your labs drawn 2 weeks prior to your appointment so that the provider can discuss your results at your appointment.  We have open lab daily Monday through Thursday from 8:30-12:30 PM and 1:30-4:30 PM and Friday from 8:30-12:30 PM and 1:30-4:00 PM at the office of Dr. Shaili Deveshwar, [facility].   Please be advised, patients with office appointments requiring lab work will take precedents over walk-in lab work.  If possible, please come for your lab work on Monday and Friday afternoons, as you may experience shorter wait times. The office is located at [address], [address], [place], [place] [address] No appointment is necessary.   Labs are drawn by [facility]. Please bring your co-pay at the time of your lab draw.  You may receive a bill from [facility] for your lab work.  If you wish to have your labs drawn at another location, please call the office 24 hours in advance to send orders.  If you have any questions regarding directions or hours of operation,  please call [phone].   As a reminder, please drink plenty of water prior to coming for your lab work. Thanks!   

## 2020-02-08 NOTE — Progress Notes (Signed)
CBC and CMP WNL

## 2020-02-09 LAB — COMPLETE METABOLIC PANEL WITH GFR
AG Ratio: 1.4 (calc) (ref 1.0–2.5)
ALT: 15 U/L (ref 6–29)
AST: 19 U/L (ref 10–35)
Albumin: 4.2 g/dL (ref 3.6–5.1)
Alkaline phosphatase (APISO): 72 U/L (ref 37–153)
BUN: 20 mg/dL (ref 7–25)
CO2: 28 mmol/L (ref 20–32)
Calcium: 9.7 mg/dL (ref 8.6–10.4)
Chloride: 106 mmol/L (ref 98–110)
Creat: 0.63 mg/dL (ref 0.50–1.05)
GFR, Est African American: 117 mL/min/{1.73_m2} (ref >=60)
GFR, Est Non African American: 101 mL/min/{1.73_m2} (ref >=60)
Globulin: 3.1 g/dL (calc) (ref 1.9–3.7)
Glucose, Bld: 90 mg/dL (ref 65–139)
Potassium: 4 mmol/L (ref 3.5–5.3)
Sodium: 141 mmol/L (ref 135–146)
Total Bilirubin: 0.8 mg/dL (ref 0.2–1.2)
Total Protein: 7.3 g/dL (ref 6.1–8.1)

## 2020-02-09 LAB — CBC WITH DIFFERENTIAL/PLATELET
Absolute Monocytes: 396 cells/uL (ref 200–950)
Basophils Absolute: 18 cells/uL (ref 0–200)
Basophils Relative: 0.4 %
Eosinophils Absolute: 41 cells/uL (ref 15–500)
Eosinophils Relative: 0.9 %
HCT: 37.6 % (ref 35.0–45.0)
Hemoglobin: 12.6 g/dL (ref 11.7–15.5)
Lymphs Abs: 1427 cells/uL (ref 850–3900)
MCH: 30.9 pg (ref 27.0–33.0)
MCHC: 33.5 g/dL (ref 32.0–36.0)
MCV: 92.2 fL (ref 80.0–100.0)
MPV: 10.3 fL (ref 7.5–12.5)
Monocytes Relative: 8.8 %
Neutro Abs: 2619 cells/uL (ref 1500–7800)
Neutrophils Relative %: 58.2 %
Platelets: 397 10*3/uL (ref 140–400)
RBC: 4.08 10*6/uL (ref 3.80–5.10)
RDW: 13.9 % (ref 11.0–15.0)
Total Lymphocyte: 31.7 %
WBC: 4.5 10*3/uL (ref 3.8–10.8)

## 2020-02-09 LAB — QUANTIFERON-TB GOLD PLUS
Mitogen-NIL: 9.74 IU/mL
NIL: 0.05 IU/mL
QuantiFERON-TB Gold Plus: NEGATIVE
TB1-NIL: <0 IU/mL
TB2-NIL: <0 IU/mL

## 2020-02-09 NOTE — Progress Notes (Signed)
TB gold negative

## 2020-02-11 ENCOUNTER — Other Ambulatory Visit: Payer: Self-pay | Admitting: Family Medicine

## 2020-02-11 DIAGNOSIS — Z1231 Encounter for screening mammogram for malignant neoplasm of breast: Secondary | ICD-10-CM

## 2020-02-15 ENCOUNTER — Other Ambulatory Visit: Payer: Self-pay | Admitting: Physician Assistant

## 2020-02-15 DIAGNOSIS — M0579 Rheumatoid arthritis with rheumatoid factor of multiple sites without organ or systems involvement: Secondary | ICD-10-CM

## 2020-02-15 NOTE — Telephone Encounter (Signed)
Last Visit: 02/07/2020 Next Visit: 07/03/2020 Labs: 02/07/2020 CBC and CMP WNL TB Gold: 02/07/2020 Neg   Current Dose per office note 02/07/2020: Rinvoq 15 mg 1 tablet by mouth daily  DX: Rheumatoid arthritis involving multiple sites with positive rheumatoid factor   Okay to refill per Dr. Corliss Skains

## 2020-03-01 ENCOUNTER — Ambulatory Visit
Admission: RE | Admit: 2020-03-01 | Discharge: 2020-03-01 | Disposition: A | Discharge status: Home / Self Care | Payer: 59 | Source: Ambulatory Visit

## 2020-03-01 ENCOUNTER — Other Ambulatory Visit: Payer: Self-pay

## 2020-03-01 DIAGNOSIS — Z1231 Encounter for screening mammogram for malignant neoplasm of breast: Secondary | ICD-10-CM

## 2020-05-16 ENCOUNTER — Other Ambulatory Visit: Payer: Self-pay | Admitting: *Deleted

## 2020-05-16 DIAGNOSIS — M0579 Rheumatoid arthritis with rheumatoid factor of multiple sites without organ or systems involvement: Secondary | ICD-10-CM

## 2020-05-16 MED ORDER — RINVOQ 15 MG PO TB24: 1.0000 | ORAL_TABLET | Freq: Every day | ORAL | 0 refills | Status: DC

## 2020-05-16 NOTE — Telephone Encounter (Signed)
RF faxed from [facility]  Next Visit: 07/03/2020  Last Visit: 02/07/2020,   Last Fill: 02/15/2020  [id number] arthritis involving multiple sites with positive rheumatoid factor   Current Dose per office note 02/07/2020, Rinvoq 15 mg 1 tablet by mouth daily   Labs: 02/07/2020, CBC and CMP WNL  Lipid 12/27/2019, Cholesterol 210, LDL 104  I called patient to have labs updated.  TB Gold: 02/07/2020, negative  Okay to refill Rinvoq?

## 2020-05-26 ENCOUNTER — Other Ambulatory Visit: Payer: Self-pay

## 2020-05-26 DIAGNOSIS — Z79899 Other long term (current) drug therapy: Secondary | ICD-10-CM

## 2020-05-27 LAB — CBC WITH DIFFERENTIAL/PLATELET
Absolute Monocytes: 339 cells/uL (ref 200–950)
Basophils Absolute: 21 cells/uL (ref 0–200)
Basophils Relative: 0.4 %
Eosinophils Absolute: 11 cells/uL — ABNORMAL LOW (ref 15–500)
Eosinophils Relative: 0.2 %
HCT: 42 % (ref 35.0–45.0)
Hemoglobin: 13.7 g/dL (ref 11.7–15.5)
Lymphs Abs: 1500 cells/uL (ref 850–3900)
MCH: 30.2 pg (ref 27.0–33.0)
MCHC: 32.6 g/dL (ref 32.0–36.0)
MCV: 92.5 fL (ref 80.0–100.0)
MPV: 10.4 fL (ref 7.5–12.5)
Monocytes Relative: 6.4 %
Neutro Abs: 3429 cells/uL (ref 1500–7800)
Neutrophils Relative %: 64.7 %
Platelets: 440 10*3/uL — ABNORMAL HIGH (ref 140–400)
RBC: 4.54 10*6/uL (ref 3.80–5.10)
RDW: 12.1 % (ref 11.0–15.0)
Total Lymphocyte: 28.3 %
WBC: 5.3 10*3/uL (ref 3.8–10.8)

## 2020-05-27 LAB — COMPLETE METABOLIC PANEL WITH GFR
AG Ratio: 1.3 (calc) (ref 1.0–2.5)
ALT: 12 U/L (ref 6–29)
AST: 23 U/L (ref 10–35)
Albumin: 4.7 g/dL (ref 3.6–5.1)
Alkaline phosphatase (APISO): 85 U/L (ref 37–153)
BUN: 11 mg/dL (ref 7–25)
CO2: 27 mmol/L (ref 20–32)
Calcium: 10.2 mg/dL (ref 8.6–10.4)
Chloride: 103 mmol/L (ref 98–110)
Creat: 0.69 mg/dL (ref 0.50–1.05)
GFR, Est African American: 114 mL/min/{1.73_m2} (ref >=60)
GFR, Est Non African American: 98 mL/min/{1.73_m2} (ref >=60)
Globulin: 3.6 g/dL (calc) (ref 1.9–3.7)
Glucose, Bld: 92 mg/dL (ref 65–99)
Potassium: 4.4 mmol/L (ref 3.5–5.3)
Sodium: 141 mmol/L (ref 135–146)
Total Bilirubin: 0.5 mg/dL (ref 0.2–1.2)
Total Protein: 8.3 g/dL — ABNORMAL HIGH (ref 6.1–8.1)

## 2020-05-29 NOTE — Progress Notes (Signed)
Total protein is borderline elevated-8.3. rest of CMP WNL.  Platelet count is borderline elevated-440.  Absolute eosinophils are borderline low. WBC count is WNL. We will continue to monitor.

## 2020-06-19 NOTE — Progress Notes (Signed)
Office Visit Note  Patient: Vickie [name]             Date of Birth: 08/05/1965           MRN: [id number]             PCP: Martinique, Betty G, MD Referring: [place], Betty G, MD Visit Date: 07/03/2020 Occupation: '@GUAROCC' @  Subjective:  Medication management.   History of Present Illness: Vickie [name] is a 55 y.o. female with a history of rheumatoid arthritis.  She states she has been tolerating her Rasuvo 20 mg subcu weekly and Rinvoq 15 mg p.o. daily without any problems.  She has not noticed any increased joint pain or joint swelling.  The nodules on her elbows have resolved.  She had good results from left grade 2 bunionectomy last year.  Activities of Daily Living:  Patient reports morning stiffness for 0 minutes.   Patient Denies nocturnal pain.  Difficulty dressing/grooming: Denies Difficulty climbing stairs: Denies Difficulty getting out of chair: Denies Difficulty using hands for taps, buttons, cutlery, and/or writing: Denies  Review of Systems  Constitutional: Negative for fatigue.  HENT: Negative for mouth sores, mouth dryness and nose dryness.   Eyes: Negative for pain, itching and dryness.  Respiratory: Negative for shortness of breath and difficulty breathing.   Cardiovascular: Negative for chest pain and palpitations.  Gastrointestinal: Negative for blood in stool, constipation and diarrhea.  Endocrine: Negative for increased urination.  Genitourinary: Negative for difficulty urinating.  Musculoskeletal: Negative for arthralgias, joint pain, joint swelling, myalgias, morning stiffness, muscle tenderness and myalgias.  Skin: Negative for color change, rash and redness.  Allergic/Immunologic: Negative for susceptible to infections.  Neurological: Negative for dizziness, numbness, headaches, memory loss and weakness.  Hematological: Negative for bruising/bleeding tendency.  Psychiatric/Behavioral: Negative for confusion. The patient is nervous/anxious.     PMFS  History:  Patient Active Problem List   Diagnosis Date Noted  . PVC (premature ventricular contraction) 01/02/2017  . Snoring 01/02/2017  . High risk medication use 10/24/2016  . Calcaneal spur of both feet 10/24/2016  . Rheumatoid arthritis ([place]) 08/07/2016  . Hypertension, essential 08/07/2016    Past Medical History:  Diagnosis Date  . Arthritis    RA Dx at age 39  . Bell's palsy   . Chicken pox   . Hypertension   . PVC (premature ventricular contraction) 01/02/2017  . Rheumatoid arthritis ([place])   . Snoring 01/02/2017    Family History  Problem Relation Age of Onset  . Rheum arthritis Mother   . Diabetes Neg Hx   . Hypertension Neg Hx   . Hyperlipidemia Neg Hx   . Stroke Neg Hx   . Colon cancer Neg Hx    Past Surgical History:  Procedure Laterality Date  . BREAST BIOPSY Left    unsure of time  - ?2016 in [place]  . CESAREAN SECTION    . FOOT SURGERY Left 04/2019   Social History   Social History Narrative  . Not on file   Immunization History  Administered Date(s) Administered  . Influenza Inj Mdck Quad Pf 11/28/2016  . Influenza Split 12/17/2019  . Influenza,inj,Quad PF,6+ Mos 12/05/2015, 12/19/2017, 11/12/2018  . PFIZER Comirnaty([place])Covid-19 Tri-Sucrose Vaccine 05/31/2020  . PFIZER([place])SARS-COV-2 Vaccination 05/10/2019, 06/07/2019, 11/12/2019  . Tdap 09/26/2014  . Zoster Recombinat (Shingrix) 11/12/2018, 03/25/2019     Objective: Vital Signs: BP 120/76 (BP Location: Left Arm, Patient Position: Sitting, Cuff Size: Normal)   Pulse 80   Resp  14   Ht '5\' 2"'  (1.575 m)   Wt 143 lb 3.2 oz (65 kg)   BMI 26.19 kg/m    Physical Exam Vitals and nursing note reviewed.  Constitutional:      Appearance: She is well-developed.  HENT:     Head: Normocephalic and atraumatic.  Eyes:     Conjunctiva/sclera: Conjunctivae normal.  Cardiovascular:     Rate and Rhythm: Normal rate and regular rhythm.     Heart sounds: Normal heart sounds.  Pulmonary:      Effort: Pulmonary effort is normal.     Breath sounds: Normal breath sounds.  Abdominal:     General: Bowel sounds are normal.     Palpations: Abdomen is soft.  Musculoskeletal:     Cervical back: Normal range of motion.  Lymphadenopathy:     Cervical: No cervical adenopathy.  Skin:    General: Skin is warm and dry.     Capillary Refill: Capillary refill takes less than 2 seconds.  Neurological:     Mental Status: She is alert and oriented to person, place, and time.  Psychiatric:        Behavior: Behavior normal.      Musculoskeletal Exam: C-spine was in good range of motion.  Shoulder joints, elbow joints were in good range of motion.  She has limitation range of motion of her wrist joints.  She has bilateral MCP synovial thickening and ulnar deviation.  She is also some PIP and DIP changes due to rheumatoid arthritis but no active synovitis was noted.  Hip joints and knee joints with good range of motion.  She had surgical scar on her left first second and third toe from recent surgery.  No synovitis was noted.  Hammertoes were noted in her right foot.  CDAI Exam: CDAI Score: 0.4  Patient Global: 2 mm; Provider Global: 2 mm Swollen: 0 ; Tender: 0  Joint Exam 07/03/2020   No joint exam has been documented for this visit   There is currently no information documented on the homunculus. Go to the Rheumatology activity and complete the homunculus joint exam.  Investigation: No additional findings.  Imaging: No results found.  Recent Labs: Lab Results  Component Value Date   WBC 5.3 05/26/2020   HGB 13.7 05/26/2020   PLT 440 (H) 05/26/2020   NA 141 05/26/2020   K 4.4 05/26/2020   CL 103 05/26/2020   CO2 27 05/26/2020   GLUCOSE 92 05/26/2020   BUN 11 05/26/2020   CREATININE 0.69 05/26/2020   BILITOT 0.5 05/26/2020   ALKPHOS 79 01/02/2017   AST 23 05/26/2020   ALT 12 05/26/2020   PROT 8.3 (H) 05/26/2020   ALBUMIN 4.3 01/02/2017   CALCIUM 10.2 05/26/2020   GFRAA  114 05/26/2020   QFTBGOLDPLUS NEGATIVE 02/07/2020    Speciality Comments: No specialty comments available.  Procedures:  No procedures performed Allergies: Latex and Sulfa antibiotics   Assessment / Plan:     Visit Diagnoses: Rheumatoid arthritis involving multiple sites with positive rheumatoid factor (HCC) - Positive RF, positive anti-CCP, elevated ESR, erosive disease with nodulosis: She is doing well on the combination therapy with Rinvoq and Rasuvo.  Nodulosis is almost resolved.  She had no synovitis on examination.  High risk medication use - Rinvoq 15 mg 1 tablet by mouth daily and rasuvo 20 mg sq injections once weekly. Discontinue humira-inadequate response.  Labs from May 26, 2020 were reviewed which were within normal limits.  TB gold was negative in  December 2021.  She was advised to discontinue Rasuvo and Rinvoq if she develops an infection and resume once the infection resolves.  She is fully vaccinated against COVID-19.  Patient believes that she had Shingrix vaccine last year as well.  Have advised her to check the status of pneumococcal vaccine.  Increased risk of blood clots and MACE. blackbox warning on Rinvoq was reviewed.  Rheumatoid nodule, right elbow (HCC) - Resolved   Rheumatoid nodule, left elbow (HCC) - Resolved   S/P bunionectomy - left 2021 by Dr. Doran Durand.  She had good results.  Trochanteric bursitis, left hip -she has off-and-on discomfort.  Stretching was discomfort.  Stretching exercises were emphasized.  History of hypertension-her blood pressure is normal today.  Increased risk of heart disease with rheumatoid arthritis was discussed.  Dietary modifications and exercises were emphasized and handout was placed in the AVS.  Anxiety-she has been experiencing increased anxiety.  She is planning to discuss this further with her PCP for treatment.  Sleep apnea-patient states that she was diagnosed with mild sleep apnea.  She is not using a CPAP at this time.   She will have a follow-up appointment.  Orders: No orders of the defined types were placed in this encounter.  No orders of the defined types were placed in this encounter.    Follow-Up Instructions: Return in about 5 months (around 12/03/2020) for Rheumatoid arthritis.   Bo Merino, MD  Note - This record has been created using Editor, commissioning.  Chart creation errors have been sought, but may not always  have been located. Such creation errors do not reflect on  the standard of medical care.

## 2020-07-03 ENCOUNTER — Encounter: Payer: Self-pay | Admitting: Rheumatology

## 2020-07-03 ENCOUNTER — Ambulatory Visit (INDEPENDENT_AMBULATORY_CARE_PROVIDER_SITE_OTHER): Payer: 59 | Admitting: Rheumatology

## 2020-07-03 ENCOUNTER — Other Ambulatory Visit: Payer: Self-pay

## 2020-07-03 VITALS — BP 120/76 | HR 80 | Resp 14 | Ht 62.0 in | Wt 143.2 lb

## 2020-07-03 DIAGNOSIS — R7989 Other specified abnormal findings of blood chemistry: Secondary | ICD-10-CM

## 2020-07-03 DIAGNOSIS — M06321 Rheumatoid nodule, right elbow: Secondary | ICD-10-CM

## 2020-07-03 DIAGNOSIS — M0579 Rheumatoid arthritis with rheumatoid factor of multiple sites without organ or systems involvement: Secondary | ICD-10-CM

## 2020-07-03 DIAGNOSIS — F419 Anxiety disorder, unspecified: Secondary | ICD-10-CM

## 2020-07-03 DIAGNOSIS — Z79899 Other long term (current) drug therapy: Secondary | ICD-10-CM

## 2020-07-03 DIAGNOSIS — M7731 Calcaneal spur, right foot: Secondary | ICD-10-CM

## 2020-07-03 DIAGNOSIS — M7062 Trochanteric bursitis, left hip: Secondary | ICD-10-CM

## 2020-07-03 DIAGNOSIS — Z8679 Personal history of other diseases of the circulatory system: Secondary | ICD-10-CM

## 2020-07-03 DIAGNOSIS — M7741 Metatarsalgia, right foot: Secondary | ICD-10-CM

## 2020-07-03 DIAGNOSIS — Z9889 Other specified postprocedural states: Secondary | ICD-10-CM

## 2020-07-03 DIAGNOSIS — M65352 Trigger finger, left little finger: Secondary | ICD-10-CM

## 2020-07-03 DIAGNOSIS — M06322 Rheumatoid nodule, left elbow: Secondary | ICD-10-CM | POA: Diagnosis not present

## 2020-07-03 NOTE — Patient Instructions (Signed)
Standing Labs We placed an order today for your standing lab work.   Please have your standing labs drawn in July and every 3 months  If possible, please have your labs drawn 2 weeks prior to your appointment so that the provider can discuss your results at your appointment.  We have open lab daily Monday through Thursday from 1:30-4:30 PM and Friday from 1:30-4:00 PM at the office of Dr. Dmitry Macomber, [facility].   Please be advised, all patients with office appointments requiring lab work will take precedents over walk-in lab work.  If possible, please come for your lab work on Monday and Friday afternoons, as you may experience shorter wait times. The office is located at [address], [address], [place], [place] [address] No appointment is necessary.   Labs are drawn by [facility]. Please bring your co-pay at the time of your lab draw.  You may receive a bill from [facility] for your lab work.  If you wish to have your labs drawn at another location, please call the office 24 hours in advance to send orders.  If you have any questions regarding directions or hours of operation,  please call [phone].   As a reminder, please drink plenty of water prior to coming for your lab work. Thanks!  Vaccines You are taking a medication(s) that can suppress your immune system.  The following immunizations are recommended: . Flu annually . Covid-19  . Pneumonia (Pneumovax 23 and Prevnar 13 spaced at least 1 year apart) . Shingrix (after age 50)  Please check with your PCP to make sure you are up to date.  Heart Disease Prevention   Your inflammatory disease increases your risk of heart disease which includes heart attack, stroke, atrial fibrillation (irregular heartbeats), high blood pressure, heart failure and atherosclerosis (plaque in the arteries).  It is important to reduce your risk by:   . Keep blood pressure, cholesterol, and blood sugar at healthy levels   . Smoking  Cessation   . Maintain a healthy weight  o BMI 20-25   . Eat a healthy diet  o Plenty of fresh fruit, vegetables, and whole grains  o Limit saturated fats, foods high in sodium, and added sugars  o DASH and Mediterranean diet   . Increase physical activity  o Recommend moderate physically activity for 150 minutes per week/ 30 minutes a day for five days a week These can be broken up into three separate ten-minute sessions during the day.   . Reduce Stress  . Meditation, slow breathing exercises, yoga, coloring books  . Dental visits twice a year   

## 2020-07-11 ENCOUNTER — Encounter: Payer: Self-pay | Admitting: Rheumatology

## 2020-07-11 NOTE — Telephone Encounter (Signed)
Called [employer], they confirmed patient has exhausted her copay card and was transferred to the copay assistance dept. Took 1 hour to get a rep on the line, rep, Greig Castilla, initiated benefits investigation for patient's copay to confirm why she maxed out her copay card.  Conferenced patient on the line and they discussed the [employer] Rebate program- Patient eligible for $10,000 for copay reimbursements. Patient would have to pay up front for medication, then submit for reimbursement. Reimbursements come through check, then could get direct deposit for rebates. [email or web address] - Payments can not be reimbursed if payment is made with a FSA or HSA account.   Her current copay is $574.21  Patient discussed with rep and said she is interested in this program so she can meet her Max OOP for the year. She will sign up and will reach out to the office with any issues.  RepGreig Castilla  Phone# [phone]  Nothing further is needed at this time.

## 2020-07-12 ENCOUNTER — Ambulatory Visit: Payer: 59 | Admitting: Family Medicine

## 2020-07-19 ENCOUNTER — Telehealth (INDEPENDENT_AMBULATORY_CARE_PROVIDER_SITE_OTHER): Payer: 59 | Admitting: Family Medicine

## 2020-07-19 ENCOUNTER — Encounter: Payer: Self-pay | Admitting: Family Medicine

## 2020-07-19 VITALS — Ht 62.0 in | Wt 143.2 lb

## 2020-07-19 DIAGNOSIS — J019 Acute sinusitis, unspecified: Secondary | ICD-10-CM

## 2020-07-19 MED ORDER — AMOXICILLIN 500 MG PO TABS
500.0000 mg | ORAL_TABLET | Freq: Two times a day (BID) | ORAL | 0 refills | Status: DC
Start: 2020-07-19 — End: 2020-07-26

## 2020-07-19 NOTE — Progress Notes (Signed)
Virtual Visit via Video Note  I connected with Vickie [name] on 07/19/20 at  2:30 PM EDT by a video enabled telemedicine application 2/2 COVID-19 pandemic and verified that I am speaking with the correct person using two identifiers.  Location patient: home Location provider:work or home office Persons participating in the virtual visit: patient, provider  I discussed the limitations of evaluation and management by telemedicine and the availability of in person appointments. The patient expressed understanding and agreed to proceed.   HPI: Pt with nasal drainage, soreness around eyes 1 + wks ago.  Pt took Delsym and Coricidin HBP per Teacher, English as a foreign language advice.  Symptoms improved, but returned.  Pt now with sore throat, ear pain, rhinorrhea, coughing, off and on HA, itchy eyes.  Denies n/v, diarrhea, fever, sick contacts.  Denies history of seasonal allergies.  Has Flonase at home but forgot to use it.   ROS: See pertinent positives and negatives per HPI.  Past Medical History:  Diagnosis Date  . Arthritis    RA Dx at age 67  . Bell's palsy   . Chicken pox   . Hypertension   . PVC (premature ventricular contraction) 01/02/2017  . Rheumatoid arthritis (HCC)   . Snoring 01/02/2017    Past Surgical History:  Procedure Laterality Date  . BREAST BIOPSY Left    unsure of time  - ?2016 in [place]  . CESAREAN SECTION    . FOOT SURGERY Left 04/2019    Family History  Problem Relation Age of Onset  . Rheum arthritis Mother   . Diabetes Neg Hx   . Hypertension Neg Hx   . Hyperlipidemia Neg Hx   . Stroke Neg Hx   . Colon cancer Neg Hx     Current Outpatient Medications:  .  amLODipine (NORVASC) 5 MG tablet, TAKE 1 TABLET BY MOUTH  DAILY, Disp: 90 tablet, Rfl: 3 .  diclofenac sodium (VOLTAREN) 1 % GEL, Apply 2 g to 4 g to affected joints up to 4 times daily PRN, Disp: 4 Tube, Rfl: 2 .  famotidine (PEPCID) 40 MG tablet, Take 1 tablet (40 mg total) by mouth daily., Disp: 30 tablet, Rfl:  2 .  folic acid (FOLVITE) 1 MG tablet, Take 2 tablets (2 mg total) by mouth daily., Disp: 180 tablet, Rfl: 0 .  Methotrexate, PF, (RASUVO) 20 MG/0.4ML SOAJ, INJECT SUBCUTANEOUSLY 20MG   ONCE WEEKLY, Disp: 4.8 mL, Rfl: 3 .  Upadacitinib ER (RINVOQ) 15 MG TB24, Take 1 tablet by mouth daily., Disp: 90 tablet, Rfl: 0  EXAM:  VITALS per patient if applicable:  RR 12-20 bpm  GENERAL: alert, oriented, appears well and in no acute distress  HEENT: atraumatic, conjunctiva clear, no obvious abnormalities on inspection of external nose and ears  NECK: normal movements of the head and neck  LUNGS: on inspection no signs of respiratory distress, breathing rate appears normal, no obvious gross SOB, gasping or wheezing  CV: no obvious cyanosis  MS: moves all visible extremities without noticeable abnormality  PSYCH/NEURO: pleasant and cooperative, no obvious depression or anxiety, speech and thought processing grossly intact  ASSESSMENT AND PLAN:  Discussed the following assessment and plan:  Acute sinusitis, recurrence not specified, unspecified location  -continue supportive care Tylenol prn for pain/discomfort, OTC cough/could med as needed, gargling with warm salt water or Chloraseptic spray, rest, hydration -Discussed using Flonase.  Patient declines Rx as has Flonase at home -Start amoxicillin twice daily x7 days -Patient advised to continue taking methotrexate given current infection. -  Plan: amoxicillin (AMOXIL) 500 MG tablet  Follow-up as needed   I discussed the assessment and treatment plan with the patient. The patient was provided an opportunity to ask questions and all were answered. The patient agreed with the plan and demonstrated an understanding of the instructions.   The patient was advised to call back or seek an in-person evaluation if the symptoms worsen or if the condition fails to improve as anticipated.  Deeann Saint, MD

## 2020-07-25 ENCOUNTER — Telehealth: Payer: Self-pay | Admitting: Family Medicine

## 2020-07-25 NOTE — Telephone Encounter (Signed)
I called and spoke with patient. We went over the information below & she verbalized understanding. Appointment made for tomorrow at 4:30.

## 2020-07-25 NOTE — Telephone Encounter (Signed)
Pt is not any better since her last visit on 05/25/ and still has coughing congestion itchy throat;  her symptoms nothing has improved.   pt asking for a bronchi inhaler or have an RX for Ciprofloxacin

## 2020-07-25 NOTE — Telephone Encounter (Signed)
I do not feel comfortable prescribing an inhaler without appropriate indication/evaluation. No hx of asthma or COPD. Albuterol inh will not help with cough unless there is bronchospasm. Cough and congestion can last a few more days and even weeks after a respiratory infection.  She was already treated with abx for sinusitis. It has been a few days since she was seen, she can wait a few more days and follow if not any better. If concerned about pneumonia, we can arrange for CXR. Thanks, BJ

## 2020-07-26 ENCOUNTER — Other Ambulatory Visit: Payer: Self-pay

## 2020-07-26 ENCOUNTER — Encounter: Payer: Self-pay | Admitting: Family Medicine

## 2020-07-26 ENCOUNTER — Ambulatory Visit (INDEPENDENT_AMBULATORY_CARE_PROVIDER_SITE_OTHER): Payer: 59 | Admitting: Family Medicine

## 2020-07-26 VITALS — BP 120/70 | HR 90 | Resp 16 | Ht 62.0 in

## 2020-07-26 DIAGNOSIS — R059 Cough, unspecified: Secondary | ICD-10-CM | POA: Diagnosis not present

## 2020-07-26 DIAGNOSIS — H6983 Other specified disorders of Eustachian tube, bilateral: Secondary | ICD-10-CM

## 2020-07-26 MED ORDER — HYDROCODONE BIT-HOMATROP MBR 5-1.5 MG/5ML PO SOLN: 5.0000 mL | Freq: Two times a day (BID) | ORAL | 0 refills | Status: AC | PRN

## 2020-07-26 MED ORDER — BENZONATATE 100 MG PO CAPS: 200.0000 mg | ORAL_CAPSULE | Freq: Two times a day (BID) | ORAL | 0 refills | Status: AC | PRN

## 2020-07-26 NOTE — Patient Instructions (Addendum)
A few things to remember from today's visit:   Cough - Plan: HYDROcodone bit-homatropine (HYCODAN) 5-1.5 MG/5ML syrup, benzonatate (TESSALON) 100 MG capsule  Dysfunction of both eustachian tubes  If you need refills please call your pharmacy. Do not use My Chart to request refills or for acute issues that need immediate attention.   Cough can last a few more weeks. Pop your ears a few times per day. Adequate hydration. Plain Mucinex. Hycodan at night. If not any better in 7-10 days, we can arranged for a CXR.  Please be sure medication list is accurate. If a new problem present, please set up appointment sooner than planned today.

## 2020-07-26 NOTE — Progress Notes (Signed)
Chief Complaint  Patient presents with  . Follow-up   HPI: Vickie [name] is a 55 y.o. female with hx of RA,HTN, and PVC's here today complaining of persistent cough. 2 weeks of productive cough with small amount of light yellowish sputum. Intermittent throbbing earache and sore throat. She did fly recently, she was already sick. Ear fullness sensation, mainly when lying in bed. No sick contact. COVID 19 test negative.  She was seen virtually on 07/19/20, treated for acute sinusitis with Amoxicillin x 7 days. She has not noted any improvement, cough is not getting worse. Negative for associated fever,chills,CP,wheezing,or body aches. SOB when she is having coughing spells. She has not identified exacerbating or alleviating factors.  Cough is interfering with sleep. Negative for heartburn,nausea,or vomiting.  Review of Systems  Constitutional: Positive for fatigue. Negative for appetite change.  HENT: Negative for hearing loss and trouble swallowing.   Eyes: Negative for discharge and redness.  Gastrointestinal: Negative for abdominal pain.       Negative for changes in bowel habits.  Skin: Negative for pallor and rash.  Allergic/Immunologic: Positive for environmental allergies.  Neurological: Negative for syncope, weakness and headaches.  Hematological: Negative for adenopathy. Does not bruise/bleed easily.  Psychiatric/Behavioral: Positive for sleep disturbance. Negative for confusion.  Rest see pertinent positives and negatives per HPI.  Current Outpatient Medications on File Prior to Visit  Medication Sig Dispense Refill  . amLODipine (NORVASC) 5 MG tablet TAKE 1 TABLET BY MOUTH  DAILY 90 tablet 3  . diclofenac sodium (VOLTAREN) 1 % GEL Apply 2 g to 4 g to affected joints up to 4 times daily PRN 4 Tube 2  . famotidine (PEPCID) 40 MG tablet Take 1 tablet (40 mg total) by mouth daily. 30 tablet 2  . folic acid (FOLVITE) 1 MG tablet Take 2 tablets (2 mg total) by mouth  daily. 180 tablet 0  . Methotrexate, PF, (RASUVO) 20 MG/0.4ML SOAJ INJECT SUBCUTANEOUSLY 20MG   ONCE WEEKLY 4.8 mL 3  . Upadacitinib ER (RINVOQ) 15 MG TB24 Take 1 tablet by mouth daily. 90 tablet 0   No current facility-administered medications on file prior to visit.   Past Medical History:  Diagnosis Date  . Arthritis    RA Dx at age 32  . Bell's palsy   . Chicken pox   . Hypertension   . PVC (premature ventricular contraction) 01/02/2017  . Rheumatoid arthritis (HCC)   . Snoring 01/02/2017   Allergies  Allergen Reactions  . Latex   . Sulfa Antibiotics     Social History   Socioeconomic History  . Marital status: Married    Spouse name: Not on file  . Number of children: Not on file  . Years of education: Not on file  . Highest education level: Not on file  Occupational History  . Not on file  Tobacco Use  . Smoking status: Never Smoker  . Smokeless tobacco: Never Used  Vaping Use  . Vaping Use: Never used  Substance and Sexual Activity  . Alcohol use: Yes    Comment: occ  . Drug use: No  . Sexual activity: Not on file  Other Topics Concern  . Not on file  Social History Narrative  . Not on file   Social Determinants of Health   Financial Resource Strain: Not on file  Food Insecurity: Not on file  Transportation Needs: Not on file  Physical Activity: Not on file  Stress: Not on file  Social Connections: Not  on file   Vitals:   07/26/20 1612  BP: 120/70  Pulse: 90  Resp: 16  SpO2: 99%   Body mass index is 26.19 kg/m.  Physical Exam Vitals and nursing note reviewed.  Constitutional:      General: She is not in acute distress.    Appearance: She is well-developed. She is not ill-appearing.  HENT:     Head: Normocephalic and atraumatic.     Right Ear: Ear canal and external ear normal. A middle ear effusion is present. Tympanic membrane is not erythematous.     Left Ear: Tympanic membrane, ear canal and external ear normal.     Nose: Septal  deviation present. No mucosal edema or rhinorrhea.     Right Turbinates: Enlarged.     Left Turbinates: Enlarged.     Mouth/Throat:     Mouth: Mucous membranes are moist.     Pharynx: Oropharynx is clear. Uvula midline. No oropharyngeal exudate or posterior oropharyngeal erythema.     Comments: Postnasal drainage. Eyes:     Conjunctiva/sclera: Conjunctivae normal.  Cardiovascular:     Rate and Rhythm: Normal rate and regular rhythm.     Heart sounds: No murmur heard.   Pulmonary:     Effort: Pulmonary effort is normal. No respiratory distress.     Breath sounds: Normal breath sounds. No stridor.  Lymphadenopathy:     Head:     Right side of head: No submandibular adenopathy.     Left side of head: No submandibular adenopathy.     Cervical: No cervical adenopathy.  Skin:    General: Skin is warm.     Findings: No erythema or rash.  Neurological:     General: No focal deficit present.     Mental Status: She is alert and oriented to person, place, and time.  Psychiatric:     Comments: Well groomed, good eye contact.   ASSESSMENT AND PLAN:  [name] was seen today for follow-up.  Diagnoses and all orders for this visit:  Cough Hx and examination do not suggest a serious process. Lung auscultation negative. Explained that cough and congestion after an URI can last a few more days and even weeks. We do not have X ray service today, I do not think we needed at this time. Monitor for new symptoms. Symptomatic treatment recommended. Some side effects discussed, recommend Hycodan at bedtime.  -     HYDROcodone bit-homatropine (HYCODAN) 5-1.5 MG/5ML syrup; Take 5 mLs by mouth every 12 (twelve) hours as needed for up to 10 days for cough. -     benzonatate (TESSALON) 100 MG capsule; Take 2 capsules (200 mg total) by mouth 2 (two) times daily as needed for up to 10 days.  Dysfunction of both eustachian tubes TM's are not erythematous. This problem could explained  earache. Treatment options discussed, decongestants could elevate BP. Auto inflation maneuvers a few times during the day recommended.  Return if symptoms worsen or fail to improve.  Estil Vallee G. [place], MD  [facility]. [place] office.   A few things to remember from today's visit:   Cough - Plan: HYDROcodone bit-homatropine (HYCODAN) 5-1.5 MG/5ML syrup, benzonatate (TESSALON) 100 MG capsule  Dysfunction of both eustachian tubes  If you need refills please call your pharmacy. Do not use My Chart to request refills or for acute issues that need immediate attention.   Cough can last a few more weeks. Pop your ears a few times per day. Adequate hydration. Plain Mucinex. Hycodan  at night. If not any better in 7-10 days, we can arranged for a CXR.  Please be sure medication list is accurate. If a new problem present, please set up appointment sooner than planned today.

## 2020-08-25 ENCOUNTER — Other Ambulatory Visit: Payer: Self-pay | Admitting: Physician Assistant

## 2020-08-25 ENCOUNTER — Other Ambulatory Visit: Payer: Self-pay

## 2020-08-25 DIAGNOSIS — Z79899 Other long term (current) drug therapy: Secondary | ICD-10-CM

## 2020-08-25 DIAGNOSIS — M0579 Rheumatoid arthritis with rheumatoid factor of multiple sites without organ or systems involvement: Secondary | ICD-10-CM

## 2020-08-25 NOTE — Telephone Encounter (Signed)
Next Visit: 12/14/2020  Last Visit: 07/03/2020  Last Fill: 05/16/2020  DX: Rheumatoid arthritis involving multiple sites with positive rheumatoid factor  Current Dose per office note 07/03/2020: Rinvoq 15 mg 1 tablet by mouth daily  Labs: 05/26/2020 Total protein is borderline elevated-8.3. rest of CMP WNL.  Platelet count is borderline elevated-440.  Absolute eosinophils are borderline low. WBC count isWNL. We will continue to monitor.   TB Gold: 02/07/2020 Neg   Left message to advise patient she is due to update labs.   Okay to refill Rinvoq?

## 2020-08-26 LAB — COMPLETE METABOLIC PANEL WITH GFR
AG Ratio: 1.6 (calc) (ref 1.0–2.5)
ALT: 12 U/L (ref 6–29)
AST: 22 U/L (ref 10–35)
Albumin: 4.5 g/dL (ref 3.6–5.1)
Alkaline phosphatase (APISO): 67 U/L (ref 37–153)
BUN: 15 mg/dL (ref 7–25)
CO2: 26 mmol/L (ref 20–32)
Calcium: 9.9 mg/dL (ref 8.6–10.4)
Chloride: 105 mmol/L (ref 98–110)
Creat: 0.65 mg/dL (ref 0.50–1.05)
GFR, Est African American: 116 mL/min/{1.73_m2} (ref >=60)
GFR, Est Non African American: 100 mL/min/{1.73_m2} (ref >=60)
Globulin: 2.9 g/dL (calc) (ref 1.9–3.7)
Glucose, Bld: 88 mg/dL (ref 65–99)
Potassium: 4 mmol/L (ref 3.5–5.3)
Sodium: 140 mmol/L (ref 135–146)
Total Bilirubin: 0.7 mg/dL (ref 0.2–1.2)
Total Protein: 7.4 g/dL (ref 6.1–8.1)

## 2020-08-26 LAB — CBC WITH DIFFERENTIAL/PLATELET
Absolute Monocytes: 363 cells/uL (ref 200–950)
Basophils Absolute: 20 cells/uL (ref 0–200)
Basophils Relative: 0.5 %
Eosinophils Absolute: 20 cells/uL (ref 15–500)
Eosinophils Relative: 0.5 %
HCT: 39 % (ref 35.0–45.0)
Hemoglobin: 13.1 g/dL (ref 11.7–15.5)
Lymphs Abs: 1642 cells/uL (ref 850–3900)
MCH: 31.1 pg (ref 27.0–33.0)
MCHC: 33.6 g/dL (ref 32.0–36.0)
MCV: 92.6 fL (ref 80.0–100.0)
MPV: 10.1 fL (ref 7.5–12.5)
Monocytes Relative: 9.3 %
Neutro Abs: 1856 cells/uL (ref 1500–7800)
Neutrophils Relative %: 47.6 %
Platelets: 377 10*3/uL (ref 140–400)
RBC: 4.21 10*6/uL (ref 3.80–5.10)
RDW: 13.5 % (ref 11.0–15.0)
Total Lymphocyte: 42.1 %
WBC: 3.9 10*3/uL (ref 3.8–10.8)

## 2020-08-29 NOTE — Progress Notes (Signed)
CBC and CMP WNL

## 2020-10-09 ENCOUNTER — Telehealth: Payer: Self-pay | Admitting: Pharmacist

## 2020-10-09 NOTE — Telephone Encounter (Signed)
Submitted a Prior Authorization renewal request to [facility] for RINVO[employer] via CoverMyMeds. Submitted most recent OV notes. Will update once we receive a response.  KeyFritz Pickerel) - [id number]  Chesley Mires, PharmD, MPH, BCPS Clinical Pharmacist (Rheumatology and Pulmonology)

## 2020-10-09 NOTE — Telephone Encounter (Signed)
Received notification from [facility] regarding a prior authorization for [facility]. Authorization has been APPROVED from 10/09/20 to 10/09/21.   Patient can continue to fill through [facility]: [phone]   Authorization # [id number]  Chesley Mires, PharmD, MPH, BCPS Clinical Pharmacist (Rheumatology and Pulmonology)

## 2020-10-11 ENCOUNTER — Other Ambulatory Visit: Payer: Self-pay | Admitting: Physician Assistant

## 2020-10-11 DIAGNOSIS — M0579 Rheumatoid arthritis with rheumatoid factor of multiple sites without organ or systems involvement: Secondary | ICD-10-CM

## 2020-10-11 NOTE — Progress Notes (Signed)
Cardiology Office Note:    Date:  10/12/2020   ID:  Vickie [name], DOB [birth year], MRN [id number]  PCP:  [name], Vickie G, MD   [facility] Providers Cardiologist:  Vickie Si, MD     Referring MD: [name], Vickie G, MD   No chief complaint on file.   History of Present Illness:    Vickie [name] is a 55 y.o. female with a hx of rheumatoid arthritis, hypertension, and PVCs here to re-establish care for evaluation of palpitations. She was seen in 2018 for PVCs. She had an Echo 12/2016 that revealed LVEF 60-65% and was otherwise unremarkable. She had a home sleep study 02/2017 and was prescribed an oral airway device.  Today, she is feeling okay overall. She has been experiencing palpitations maybe once a month or once every couple of weeks. Duration is typically off and on for a few minutes. The episodes make her "sit back and wonder what is going on," but she denies correlating chest pain or shortness of breath. She believes her palpitations are possibly due to anxiety or stress, usually due to her work. She may not be able to handle her stress as well as she used to. Often she communicates with her husband about this, but otherwise does not have much support. For exercise she enjoys biking, walking, and hiking. Arthritis limits her to more mild exercises and aerobics. For her diet, she has a cup of coffee every morning, and a diet coke once in a blue moon. At night, she sleeps with a mouthguard, but still has some snoring and is a restless sleeper. However, she still considers herself to be well rested by the morning. This weekend she plans to travel to the beach. She denies any lightheadedness, headaches, syncope, orthopnea, or PND. Also has no lower extremity edema.  Past Medical History:  Diagnosis Date   Anxiety 10/12/2020   Arthritis    RA Dx at age 67   Bell's palsy    Chicken pox    Hypertension    PVC (premature ventricular contraction) 01/02/2017   Rheumatoid arthritis (HCC)     Snoring 01/02/2017    Past Surgical History:  Procedure Laterality Date   BREAST BIOPSY Left    unsure of time  - ?2016 in [place]   CESAREAN SECTION     FOOT SURGERY Left 04/2019    Current Medications: Current Meds  Medication Sig   amLODipine (NORVASC) 5 MG tablet TAKE 1 TABLET BY MOUTH  DAILY   diclofenac sodium (VOLTAREN) 1 % GEL Apply 2 g to 4 g to affected joints up to 4 times daily PRN   famotidine (PEPCID) 40 MG tablet Take 1 tablet (40 mg total) by mouth daily.   folic acid (FOLVITE) 1 MG tablet Take 2 tablets (2 mg total) by mouth daily.   Methotrexate, PF, (RASUVO) 20 MG/0.4ML SOAJ INJECT SUBCUTANEOUSLY   ONCE WEEKLY   RINVOQ 15 MG TB24 TAKE 1 TABLET BY MOUTH  DAILY     Allergies:   Latex and Sulfa antibiotics   Social History   Socioeconomic History   Marital status: Married    Spouse name: Not on file   Number of children: Not on file   Years of education: Not on file   Highest education level: Not on file  Occupational History   Not on file  Tobacco Use   Smoking status: Never   Smokeless tobacco: Never  Vaping Use   Vaping Use: Never used  Substance and  Sexual Activity   Alcohol use: Yes    Comment: occ   Drug use: No   Sexual activity: Not on file  Other Topics Concern   Not on file  Social History Narrative   Not on file   Social Determinants of Health   Financial Resource Strain: Not on file  Food Insecurity: Not on file  Transportation Needs: Not on file  Physical Activity: Not on file  Stress: Not on file  Social Connections: Not on file     Family History: The patient's family history includes Rheum arthritis in her mother. There is no history of Diabetes, Hypertension, Hyperlipidemia, Stroke, or Colon cancer.  ROS:   Please see the history of present illness.    (+) Palpitations (+) Anxiety/Stress (+) Snores All other systems reviewed and are negative.  EKGs/Labs/Other Studies Reviewed:    The following studies were  reviewed today:  TTE 01/09/2017: - Left ventricle: The cavity size was normal. Wall thickness was    normal. Systolic function was normal. The estimated ejection    fraction was in the range of 60% to 65%. Wall motion was normal;    there were no regional wall motion abnormalities. Left    ventricular diastolic function parameters were normal.  - Mitral valve: There was mild regurgitation.   EKG:   10/12/2020: Sinus rhythm. Rate 71 bpm. 01/02/2017: Sinus rhythm. Rate 77 bpm. 12/06/2016: Sinus rhythm. Rate 73 bpm. 3 PVCs.   Recent Labs: 08/25/2020: ALT 12; BUN 15; Creat 0.65; Hemoglobin 13.1; Platelets 377; Potassium 4.0; Sodium 140  Recent Lipid Panel    Component Value Date/Time   CHOL 210 (H) 12/27/2019 1001   TRIG 90 12/27/2019 1001   HDL 88 12/27/2019 1001   CHOLHDL 2.4 12/27/2019 1001   VLDL 31.4 10/31/2017 0922   LDLCALC 104 (H) 12/27/2019 1001     Risk Assessment/Calculations:           Physical Exam:     Wt Readings from Last 3 Encounters:  10/12/20 139 lb 6.4 oz (63.2 kg)  07/19/20 143 lb 3.2 oz (65 kg)  07/03/20 143 lb 3.2 oz (65 kg)    VS:  BP 106/76 (BP Location: Right Arm, Patient Position: Sitting)   Pulse 71   Ht 5\' 2"  (1.575 m)   Wt 139 lb 6.4 oz (63.2 kg)   BMI 25.50 kg/m  , BMI Body mass index is 25.5 kg/m. GENERAL:  Well appearing HEENT: Pupils equal round and reactive, fundi not visualized, oral mucosa unremarkable NECK:  No jugular venous distention, waveform within normal limits, carotid upstroke brisk and symmetric, no bruits LUNGS:  Clear to auscultation bilaterally HEART:  RRR.  PMI not displaced or sustained,S1 and S2 within normal limits, no S3, no S4, no clicks, no rubs, no murmurs ABD:  Flat, positive bowel sounds normal in frequency in pitch, no bruits, no rebound, no guarding, no midline pulsatile mass, no hepatomegaly, no splenomegaly EXT:  2 plus pulses throughout, no edema, no cyanosis no clubbing SKIN:  No rashes no  nodules NEURO:  Cranial nerves II through XII grossly intact, motor grossly intact throughout PSYCH:  Cognitively intact, oriented to person place and time   ASSESSMENT:    1. PVC (premature ventricular contraction)   2. Hypertension, essential   3. Anxiety    PLAN:    In order of problems listed above: PVC (premature ventricular contraction) She has more palpitations lately.  I suspect it is related to anxiety and stress.  We will  get a 30 day monitor to better assess.    Hypertension, essential BP stable.  Continue amlodipine.    Anxiety She notes that she has been struggling with anxiety lately.  She is not as good handling stress as she was when she was younger.  She is interested in talking to our care guide for stress management techniques.      Disposition: FU with Bernise Sylvain C. Duke Salvia, MD, [facility] in 2-3 months.  Medication Adjustments/Labs and Tests Ordered: Current medicines are reviewed at length with the patient today.  Concerns regarding medicines are outlined above.   Orders Placed This Encounter  Procedures   TSH   CARDIAC EVENT MONITOR   EKG 12-Lead    No orders of the defined types were placed in this encounter.  Patient Instructions  Medication Instructions:  Your physician recommends that you continue on your current medications as directed. Please refer to the Current Medication list given to you today.   *If you need a refill on your cardiac medications before your next appointment, please call your pharmacy*  Lab Work: TSH SOON   If you have labs (blood work) drawn today and your tests are completely normal, you will receive your results only by: MyChart Message (if you have MyChart) OR A paper copy in the mail If you have any lab test that is abnormal or we need to change your treatment, we will call you to review the results.  Testing/Procedures: Your physician has recommended that you wear an event monitor. Event monitors are medical devices  that record the heart's electrical activity. Doctors most often [place] these monitors to diagnose arrhythmias. Arrhythmias are problems with the speed or rhythm of the heartbeat. The monitor is a small, portable device. You can wear one while you do your normal daily activities. This is usually used to diagnose what is causing palpitations/syncope (passing out). 30 DAY   Follow-Up: At [facility], you and your health needs are our priority.  As part of our continuing mission to provide you with exceptional heart care, we have created designated Provider Care Teams.  These Care Teams include your primary Cardiologist (physician) and Advanced Practice Providers (APPs -  Physician Assistants and Nurse Practitioners) who all work together to provide you with the care you need, when you need it.  We recommend signing up for the patient portal called "MyChart".  Sign up information is provided on this After Visit Summary.  MyChart is used to connect with patients for Virtual Visits (Telemedicine).  Patients are able to view lab/test results, encounter notes, upcoming appointments, etc.  Non-urgent messages can be sent to your provider as well.   To learn more about what you can do with MyChart, go to [email or web address].    Your next appointment:   3 month(s)  The format for your next appointment:   In Person  Provider:   Chilton Si, MD or Gillian Shields, NP  Other Instructions WILL HAVE AMY OUR CARE GUIDE REACH OUT TO YOU   [employer] Cardiac Event Monitor Instructions Your physician has requested you wear your cardiac event monitor for __30___ days, (1-30). [employer] may call or text to confirm a shipping address. The monitor will be sent to a land address via UPS. [employer] will not ship a monitor to a PO BOX. It typically takes 3-5 days to receive your monitor after it has been enrolled. [employer] will assist with USPS tracking if your package is delayed. The telephone number  for [employer] is [phone]. Once  you have received your monitor, please review the enclosed instructions. Instruction tutorials can also be viewed under help and settings on the enclosed cell phone. Your monitor has already been registered assigning a specific monitor serial # to you.  Applying the monitor Remove cell phone from case and turn it on. The cell phone works as IT consultant and needs to be within [employer] of you at all times. The cell phone will need to be charged on a daily basis. We recommend you plug the cell phone into the enclosed charger at your bedside table every night.  Monitor batteries: You will receive two monitor batteries labelled #1 and #2. These are your recorders. Plug battery #2 onto the second connection on the enclosed charger. Keep one battery on the charger at all times. This will keep the monitor battery deactivated. It will also keep it fully charged for when you need to switch your monitor batteries. A small light will be blinking on the battery emblem when it is charging. The light on the battery emblem will remain on when the battery is fully charged.  Open package of a Monitor strip. Insert battery #1 into black hood on strip and gently squeeze monitor battery onto connection as indicated in instruction booklet. Set aside while preparing skin.  Choose location for your strip, vertical or horizontal, as indicated in the instruction booklet. Shave to remove all hair from location. There cannot be any lotions, oils, powders, or colognes on skin where monitor is to be applied. Wipe skin clean with enclosed Saline wipe. Dry skin completely.  Peel paper labeled #1 off the back of the Monitor strip exposing the adhesive. Place the monitor on the chest in the vertical or horizontal position shown in the instruction booklet. One arrow on the monitor strip must be pointing upward. Carefully remove paper labeled #2, attaching remainder of strip to your  skin. Try not to create any folds or wrinkles in the strip as you apply it.  Firmly press and release the circle in the center of the monitor battery. You will hear a small beep. This is turning the monitor battery on. The heart emblem on the monitor battery will light up every 5 seconds if the monitor battery in turned on and connected to the patient securely. Do not push and hold the circle down as this turns the monitor battery off. The cell phone will locate the monitor battery. A screen will appear on the cell phone checking the connection of your monitor strip. This may read poor connection initially but change to good connection within the next minute. Once your monitor accepts the connection you will hear a series of 3 beeps followed by a climbing crescendo of beeps. A screen will appear on the cell phone showing the two monitor strip placement options. Touch the picture that demonstrates where you applied the monitor strip.  Your monitor strip and battery are waterproof. You are able to shower, bathe, or swim with the monitor on. They just ask you do not submerge deeper than 3 feet underwater. We recommend removing the monitor if you are swimming in a lake, river, or ocean.  Your monitor battery will need to be switched to a fully charged monitor battery approximately once a week. The cell phone will alert you of an action which needs to be made.  On the cell phone, tap for details to reveal connection status, monitor battery status, and cell phone battery status. The green dots indicates your monitor is  in good status. A red dot indicates there is something that needs your attention.  To record a symptom, click the circle on the monitor battery. In 30-60 seconds a list of symptoms will appear on the cell phone. Select your symptom and tap save. Your monitor will record a sustained or significant arrhythmia regardless of you clicking the button. Some patients do not feel the heart  rhythm irregularities. [employer] will notify us of any serious or critical events.  Refer to instruction booklet for instructions on switching batteries, changing strips, the Do not disturb or Pause features, or any additional questions.  Call [employer] at [phone], to confirm your monitor is transmitting and record your baseline. They will answer any questions you may have regarding the monitor instructions at that time.  Returning the monitor to [employer] Place all equipment back into blue box. Peel off strip of paper to expose adhesive and close box securely. There is a prepaid UPS shipping label on this box. Drop in a UPS drop box, or at a UPS facility like [employer]. You may also contact [employer] to arrange UPS to pick up monitor package at your home.   I,Mathew Stumpf,acting as a Neurosurgeon for Vickie Si, MD.,have documented all relevant documentation on the behalf of Vickie Si, MD,as directed by  Vickie Si, MD while in the presence of Vickie Si, MD.  I, Teairra Millar C. Duke Salvia, MD have reviewed all documentation for this visit.  The documentation of the exam, diagnosis, procedures, and orders on 10/12/2020 are all accurate and complete.   Signed, Vickie Si, MD  10/12/2020 12:51 PM    [facility]

## 2020-10-11 NOTE — Telephone Encounter (Signed)
Next Visit: 12/14/2020  Last Visit: 07/03/2020  Last Fill: 08/25/2020   DX: Rheumatoid arthritis involving multiple sites with positive rheumatoid factor Current Dose per office note on 07/03/2020: Rinvoq 15 mg 1 tablet by mouth daily  Labs: 08/25/2020 CBC and CMP WNL.   TB Gold: 02/07/2020 negative    Okay to refill rinvoq?

## 2020-10-12 ENCOUNTER — Encounter (HOSPITAL_BASED_OUTPATIENT_CLINIC_OR_DEPARTMENT_OTHER): Payer: Self-pay

## 2020-10-12 ENCOUNTER — Encounter (HOSPITAL_BASED_OUTPATIENT_CLINIC_OR_DEPARTMENT_OTHER): Payer: 59

## 2020-10-12 ENCOUNTER — Encounter (HOSPITAL_BASED_OUTPATIENT_CLINIC_OR_DEPARTMENT_OTHER): Payer: Self-pay | Admitting: Cardiovascular Disease

## 2020-10-12 ENCOUNTER — Ambulatory Visit (INDEPENDENT_AMBULATORY_CARE_PROVIDER_SITE_OTHER): Payer: 59 | Admitting: Cardiovascular Disease

## 2020-10-12 ENCOUNTER — Encounter: Payer: Self-pay | Admitting: *Deleted

## 2020-10-12 ENCOUNTER — Other Ambulatory Visit: Payer: Self-pay

## 2020-10-12 DIAGNOSIS — F419 Anxiety disorder, unspecified: Secondary | ICD-10-CM | POA: Diagnosis not present

## 2020-10-12 DIAGNOSIS — I493 Ventricular premature depolarization: Secondary | ICD-10-CM

## 2020-10-12 DIAGNOSIS — I10 Essential (primary) hypertension: Secondary | ICD-10-CM | POA: Diagnosis not present

## 2020-10-12 HISTORY — DX: Anxiety disorder, unspecified: F41.9

## 2020-10-12 NOTE — Assessment & Plan Note (Signed)
BP stable.  Continue amlodipine. 

## 2020-10-12 NOTE — Progress Notes (Signed)
Patient ID: Vickie [name], female   DOB: May 04, 1965, 55 y.o.   MRN: [id number] Patient given [employer] 30 day cardiac event monitor, serial # [id number] from [employer] office inventory.  Patient informed staff she will apply after Labor day due to beach trip. [employer] enrollment was entered by [employer] location on behalf of [employer].  [employer] listed as location on enrollment. [employer] to schedule, charge, import and assign results.

## 2020-10-12 NOTE — Patient Instructions (Addendum)
Medication Instructions:  Your physician recommends that you continue on your current medications as directed. Please refer to the Current Medication list given to you today.   *If you need a refill on your cardiac medications before your next appointment, please call your pharmacy*  Lab Work: TSH SOON   If you have labs (blood work) drawn today and your tests are completely normal, you will receive your results only by: MyChart Message (if you have MyChart) OR A paper copy in the mail If you have any lab test that is abnormal or we need to change your treatment, we will call you to review the results.  Testing/Procedures: Your physician has recommended that you wear an event monitor. Event monitors are medical devices that record the heart's electrical activity. Doctors most often [place] these monitors to diagnose arrhythmias. Arrhythmias are problems with the speed or rhythm of the heartbeat. The monitor is a small, portable device. You can wear one while you do your normal daily activities. This is usually used to diagnose what is causing palpitations/syncope (passing out). 30 DAY   Follow-Up: At [facility], you and your health needs are our priority.  As part of our continuing mission to provide you with exceptional heart care, we have created designated Provider Care Teams.  These Care Teams include your primary Cardiologist (physician) and Advanced Practice Providers (APPs -  Physician Assistants and Nurse Practitioners) who all work together to provide you with the care you need, when you need it.  We recommend signing up for the patient portal called "MyChart".  Sign up information is provided on this After Visit Summary.  MyChart is used to connect with patients for Virtual Visits (Telemedicine).  Patients are able to view lab/test results, encounter notes, upcoming appointments, etc.  Non-urgent messages can be sent to your provider as well.   To learn more about what you can do with  MyChart, go to [email or web address].    Your next appointment:   3 month(s)  The format for your next appointment:   In Person  Provider:   Chilton Si, MD or Gillian Shields, NP  Other Instructions WILL HAVE AMY OUR CARE GUIDE REACH OUT TO YOU   [employer] Cardiac Event Monitor Instructions Your physician has requested you wear your cardiac event monitor for __30___ days, (1-30). [employer] may call or text to confirm a shipping address. The monitor will be sent to a land address via UPS. [employer] will not ship a monitor to a PO BOX. It typically takes 3-5 days to receive your monitor after it has been enrolled. [employer] will assist with USPS tracking if your package is delayed. The telephone number for [employer] is [phone]. Once you have received your monitor, please review the enclosed instructions. Instruction tutorials can also be viewed under help and settings on the enclosed cell phone. Your monitor has already been registered assigning a specific monitor serial # to you.  Applying the monitor Remove cell phone from case and turn it on. The cell phone works as IT consultant and needs to be within [employer] of you at all times. The cell phone will need to be charged on a daily basis. We recommend you plug the cell phone into the enclosed charger at your bedside table every night.  Monitor batteries: You will receive two monitor batteries labelled #1 and #2. These are your recorders. Plug battery #2 onto the second connection on the enclosed charger. Keep one battery on the charger at all times. This will  keep the monitor battery deactivated. It will also keep it fully charged for when you need to switch your monitor batteries. A small light will be blinking on the battery emblem when it is charging. The light on the battery emblem will remain on when the battery is fully charged.  Open package of a Monitor strip. Insert battery #1 into black hood on strip  and gently squeeze monitor battery onto connection as indicated in instruction booklet. Set aside while preparing skin.  Choose location for your strip, vertical or horizontal, as indicated in the instruction booklet. Shave to remove all hair from location. There cannot be any lotions, oils, powders, or colognes on skin where monitor is to be applied. Wipe skin clean with enclosed Saline wipe. Dry skin completely.  Peel paper labeled #1 off the back of the Monitor strip exposing the adhesive. Place the monitor on the chest in the vertical or horizontal position shown in the instruction booklet. One arrow on the monitor strip must be pointing upward. Carefully remove paper labeled #2, attaching remainder of strip to your skin. Try not to create any folds or wrinkles in the strip as you apply it.  Firmly press and release the circle in the center of the monitor battery. You will hear a small beep. This is turning the monitor battery on. The heart emblem on the monitor battery will light up every 5 seconds if the monitor battery in turned on and connected to the patient securely. Do not push and hold the circle down as this turns the monitor battery off. The cell phone will locate the monitor battery. A screen will appear on the cell phone checking the connection of your monitor strip. This may read poor connection initially but change to good connection within the next minute. Once your monitor accepts the connection you will hear a series of 3 beeps followed by a climbing crescendo of beeps. A screen will appear on the cell phone showing the two monitor strip placement options. Touch the picture that demonstrates where you applied the monitor strip.  Your monitor strip and battery are waterproof. You are able to shower, bathe, or swim with the monitor on. They just ask you do not submerge deeper than 3 feet underwater. We recommend removing the monitor if you are swimming in a lake, river, or  ocean.  Your monitor battery will need to be switched to a fully charged monitor battery approximately once a week. The cell phone will alert you of an action which needs to be made.  On the cell phone, tap for details to reveal connection status, monitor battery status, and cell phone battery status. The green dots indicates your monitor is in good status. A red dot indicates there is something that needs your attention.  To record a symptom, click the circle on the monitor battery. In 30-60 seconds a list of symptoms will appear on the cell phone. Select your symptom and tap save. Your monitor will record a sustained or significant arrhythmia regardless of you clicking the button. Some patients do not feel the heart rhythm irregularities. [employer] will notify us of any serious or critical events.  Refer to instruction booklet for instructions on switching batteries, changing strips, the Do not disturb or Pause features, or any additional questions.  Call [employer] at [phone], to confirm your monitor is transmitting and record your baseline. They will answer any questions you may have regarding the monitor instructions at that time.  Returning the monitor to [employer]  Place all equipment back into blue box. Peel off strip of paper to expose adhesive and close box securely. There is a prepaid UPS shipping label on this box. Drop in a UPS drop box, or at a UPS facility like [employer]. You may also contact [employer] to arrange UPS to pick up monitor package at your home.

## 2020-10-12 NOTE — Assessment & Plan Note (Signed)
She has more palpitations lately.  I suspect it is related to anxiety and stress.  We will get a 30 day monitor to better assess.

## 2020-10-12 NOTE — Assessment & Plan Note (Signed)
She notes that she has been struggling with anxiety lately.  She is not as good handling stress as she was when she was younger.  She is interested in talking to our care guide for stress management techniques.

## 2020-10-13 LAB — TSH: TSH: 0.905 u[IU]/mL (ref 0.450–4.500)

## 2020-10-19 ENCOUNTER — Telehealth: Payer: Self-pay

## 2020-10-19 DIAGNOSIS — Z0000 Encounter for general adult medical examination without abnormal findings: Secondary | ICD-10-CM

## 2020-10-19 NOTE — Telephone Encounter (Signed)
Called patient to discuss health coaching for stress management per referral from Dr. Osmond. Left patient a message to return call to Care Guide at [phone]. 

## 2020-10-31 ENCOUNTER — Telehealth: Payer: Self-pay

## 2020-10-31 DIAGNOSIS — I493 Ventricular premature depolarization: Secondary | ICD-10-CM

## 2020-10-31 NOTE — Telephone Encounter (Signed)
Encounter created in error, most recent PA information was never added to [employer] portal. Nothing further needed.

## 2020-11-15 ENCOUNTER — Telehealth (HOSPITAL_BASED_OUTPATIENT_CLINIC_OR_DEPARTMENT_OTHER): Payer: Self-pay | Admitting: Cardiovascular Disease

## 2020-11-15 NOTE — Telephone Encounter (Signed)
Spoke to pt. Left 2 patches at the front desk per her request.

## 2020-11-15 NOTE — Telephone Encounter (Signed)
New Message:    Patient says she need patches for her Monitor please.

## 2020-11-15 NOTE — Telephone Encounter (Signed)
Offered patient to come to [facility] office to pick up additional strips, or we could contact [employer] to ship them to her. Patient would rather pick strips up at [facility] office.  Patient told I would contact Juliette Alcide or Ladona Ridgel at [place] [facility] to please call her back.

## 2020-12-01 NOTE — Progress Notes (Signed)
Office Visit Note  Patient: Vickie [name]             Date of Birth: [birth year]           MRN: [id number]             PCP: Martinique, Betty G, MD Referring: Martinique, Betty G, MD Visit Date: 12/14/2020 Occupation: '@GUAROCC' @  Subjective:  Medication monitoring  History of Present Illness: Vickie [name] is a 55 y.o. female with history of seropositive rheumatoid arthritis.  Patient is currently taking Rinvoq 15 mg 1 tablet by mouth daily.  She reports that she has not been compliant injecting Rasuvo on a weekly basis since she does not like to self administer the injections and experiences some nausea after the injections.  Her last Rasuvo injection was about 1 month ago.  She has not noticed any increased joint pain or joint swelling with spacing the dose of Rasuvo.  She would like to try discontinuing Rasuvo if possible.  She states that she has not had rheumatoid arthritis flare since starting on Rinvoq.  She is not experiencing any joint pain or joint swelling at this time.  She has not noticed any new or worsening symptoms with the cooler weather.  She has not had any morning stiffness, nocturnal pain, or difficulty with ADLs. She denies any recent infections.  She has had the Shingrix vaccine as well as 2 COVID boosters, and the annual flu vaccine.     Activities of Daily Living:  Patient reports morning stiffness for 0 minutes.   Patient Denies nocturnal pain.  Difficulty dressing/grooming: Denies Difficulty climbing stairs: Denies Difficulty getting out of chair: Denies Difficulty using hands for taps, buttons, cutlery, and/or writing: Denies  Review of Systems  Constitutional:  Negative for fatigue.  HENT:  Negative for mouth sores, mouth dryness and nose dryness.   Eyes:  Negative for pain, itching and dryness.  Respiratory:  Negative for shortness of breath and difficulty breathing.   Cardiovascular:  Negative for chest pain and palpitations.  Gastrointestinal:  Negative for  blood in stool, constipation and diarrhea.  Endocrine: Negative for increased urination.  Genitourinary:  Negative for difficulty urinating.  Musculoskeletal:  Negative for joint pain, joint pain, joint swelling, myalgias, morning stiffness, muscle tenderness and myalgias.  Skin:  Negative for color change, rash and redness.  Allergic/Immunologic: Negative for susceptible to infections.  Neurological:  Positive for headaches. Negative for dizziness, numbness, memory loss and weakness.  Hematological:  Negative for bruising/bleeding tendency.  Psychiatric/Behavioral:  Negative for confusion.    PMFS History:  Patient Active Problem List   Diagnosis Date Noted   Anxiety 10/12/2020   PVC (premature ventricular contraction) 01/02/2017   Snoring 01/02/2017   High risk medication use 10/24/2016   Calcaneal spur of both feet 10/24/2016   Rheumatoid arthritis ([place]) 08/07/2016   Hypertension, essential 08/07/2016    Past Medical History:  Diagnosis Date   Anxiety 10/12/2020   Arthritis    RA Dx at age 55   Bell's palsy    Chicken pox    Hypertension    PVC (premature ventricular contraction) 01/02/2017   Rheumatoid arthritis ([place])    Snoring 01/02/2017    Family History  Problem Relation Age of Onset   Rheum arthritis Mother    Diabetes Neg Hx    Hypertension Neg Hx    Hyperlipidemia Neg Hx    Stroke Neg Hx    Colon cancer Neg Hx    Past Surgical  History:  Procedure Laterality Date   BREAST BIOPSY Left    unsure of time  - ?2016 in [place] Left 04/2019   Social History   Social History Narrative   Not on file   Immunization History  Administered Date(s) Administered   Influenza Inj Mdck Quad Pf 11/28/2016   Influenza Split 12/17/2019   Influenza,inj,Quad PF,6+ Mos 12/05/2015, 12/19/2017, 11/12/2018   PFIZER Comirnaty(Gray Top)Covid-19 Tri-Sucrose Vaccine 05/31/2020   PFIZER(Purple Top)SARS-COV-2 Vaccination 05/10/2019, 06/07/2019,  11/12/2019   Tdap 09/26/2014   Zoster Recombinat (Shingrix) 11/12/2018, 03/25/2019     Objective: Vital Signs: BP 139/83 (BP Location: Left Arm, Patient Position: Sitting, Cuff Size: Normal)   Pulse 80   Ht '5\' 2"'  (1.575 m)   Wt 145 lb 3.2 oz (65.9 kg)   BMI 26.56 kg/m    Physical Exam Vitals and nursing note reviewed.  Constitutional:      Appearance: She is well-developed.  HENT:     Head: Normocephalic and atraumatic.  Eyes:     Conjunctiva/sclera: Conjunctivae normal.  Pulmonary:     Effort: Pulmonary effort is normal.  Abdominal:     Palpations: Abdomen is soft.  Musculoskeletal:     Cervical back: Normal range of motion.  Skin:    General: Skin is warm and dry.     Capillary Refill: Capillary refill takes less than 2 seconds.  Neurological:     Mental Status: She is alert and oriented to person, place, and time.  Psychiatric:        Behavior: Behavior normal.     Musculoskeletal Exam: C-spine has good ROM with some stiffness.  Shoulder joints and elbow joints have good ROM with no discomfort.  Limited ROM of both wrist joints.  Synovial thickening of MCPs with ulnar deviation.  No synovitis of MCP joints noted. Hip joints have good ROM with no discomfort.  Knee joints have good ROM with no warmth or effusion.  Ankle joints have good ROM with no tenderness or joint swelling.   CDAI Exam: CDAI Score: 0  Patient Global: 0 mm; Provider Global: 0 mm Swollen: 0 ; Tender: 0  Joint Exam 12/14/2020   No joint exam has been documented for this visit   There is currently no information documented on the homunculus. Go to the Rheumatology activity and complete the homunculus joint exam.  Investigation: No additional findings.  Imaging: No results found.  Recent Labs: Lab Results  Component Value Date   WBC 3.9 08/25/2020   HGB 13.1 08/25/2020   PLT 377 08/25/2020   NA 140 08/25/2020   K 4.0 08/25/2020   CL 105 08/25/2020   CO2 26 08/25/2020   GLUCOSE 88  08/25/2020   BUN 15 08/25/2020   CREATININE 0.65 08/25/2020   BILITOT 0.7 08/25/2020   ALKPHOS 79 01/02/2017   AST 22 08/25/2020   ALT 12 08/25/2020   PROT 7.4 08/25/2020   ALBUMIN 4.3 01/02/2017   CALCIUM 9.9 08/25/2020   GFRAA 116 08/25/2020   QFTBGOLDPLUS NEGATIVE 02/07/2020    Speciality Comments: No specialty comments available.  Procedures:  No procedures performed Allergies: Latex and Sulfa antibiotics   Assessment / Plan:     Visit Diagnoses: Rheumatoid arthritis involving multiple sites with positive rheumatoid factor (HCC) - Positive RF, positive anti-CCP, elevated ESR, erosive disease with nodulosis: She has no tenderness or synovitis on examination today.  She has not had any signs or symptoms of a rheumatoid arthritis  flare since starting on Rinvoq initially.  She has not been compliant with the weekly Rasuvo injections due to experiencing nausea after each injection and not liking to have to self administer the injection.  Her last dose of Rasuvo was over 1 month ago and she has not had any increased joint pain or inflammation.  She has not been experiencing any morning stiffness, nocturnal pain, or difficulty with ADLs.  She would like to try Rinvoq as monotherapy.  Discussed that if she develops increased joint pain or joint swelling she should resume Rasuvo 20 mg subcutaneous injections once weekly.  She was advised to notify us if she develops signs or symptoms of a flare.  She will follow-up in the office in 5 months.  High risk medication use - Rinvoq 15 mg 1 tablet by mouth daily.  Her last dose of rasuvo was about 1 month ago.  She plans on discontinuing rasuvo and would like to see how she does on rinvoq as monotherapy.  She experiencing nausea after the rasuvo injections. Discontinued enbrel and humira-inadequate response.  CBC and CMP within normal limits on 08/25/2020.  She is due to update lab work today.  Orders were released.  Her next lab work will be due in  January and every 3 months to monitor for drug toxicity.  Standing orders for CBC and CMP remain in place.  TB Gold negative on 02/07/2020.  Future order for TB gold placed today. Lipid panel updated on 12/27/2019. - Plan: CBC with Differential/Platelet, COMPLETE METABOLIC PANEL WITH GFR, QuantiFERON-TB Gold Plus She has not had any recent infections.  Discussed the importance of holding Rinvoq if she develops signs or symptoms of an infection and to resume once the infection has completely cleared. She has had the shingrix vaccines, annual influenza vaccine, and 2 covid-19 vaccine boosters.  Discussed the above box warning associated with taking a Jak inhibitor including increased risk for malignancy and MACE.  Discussed the importance of modifying risk factors as well as close monitoring of her blood pressure and lipid panel.  She will be having an updated lipid panel drawn with her physical next month.  She was advised to have the results forwarded to our office. Association of heart disease with rheumatoid arthritis was discussed. Need to monitor blood pressure, cholesterol, and to exercise 30-60 minutes on daily basis was discussed.   Screening for tuberculosis - Future order for TB gold placed today. Plan: QuantiFERON-TB Gold Plus  Rheumatoid nodule, right elbow (HCC) - Resolved.   Rheumatoid nodule, left elbow (HCC) - Resolved.   S/P bunionectomy - left 2021 by Dr. Doran Durand.  Trochanteric bursitis, left hip: Resolved.   Other medical conditions are listed as follows:   Anxiety: She experiences intermittent palpitations while anxious.  She recently wore a heart monitor for 1 month ordered by her cardiologist but has not heard back about the results yet.   History of hypertension: BP was 139/83 today in the office. Discussed the importance of close blood pressure monitoring.   Sleep apnea, unspecified type  Elevated cholesterol: Cholesterol was 210 and LDL was 104 on 12/27/19.  Discussed  the importance of updating lipid panel but she would like to have it drawn with her physical next month.    Orders: Orders Placed This Encounter  Procedures   CBC with Differential/Platelet   COMPLETE METABOLIC PANEL WITH GFR   QuantiFERON-TB Gold Plus    No orders of the defined types were placed in this encounter.  Follow-Up Instructions: Return in about 5 months (around 05/14/2021) for Rheumatoid arthritis.   Ofilia Neas, PA-C  Note - This record has been created using Dragon software.  Chart creation errors have been sought, but may not always  have been located. Such creation errors do not reflect on  the standard of medical care.

## 2020-12-04 ENCOUNTER — Other Ambulatory Visit: Payer: Self-pay | Admitting: Family Medicine

## 2020-12-04 DIAGNOSIS — I10 Essential (primary) hypertension: Secondary | ICD-10-CM

## 2020-12-14 ENCOUNTER — Ambulatory Visit (INDEPENDENT_AMBULATORY_CARE_PROVIDER_SITE_OTHER): Payer: 59 | Admitting: Physician Assistant

## 2020-12-14 ENCOUNTER — Other Ambulatory Visit: Payer: Self-pay

## 2020-12-14 ENCOUNTER — Encounter: Payer: Self-pay | Admitting: Physician Assistant

## 2020-12-14 VITALS — BP 139/83 | HR 80 | Ht 62.0 in | Wt 145.2 lb

## 2020-12-14 DIAGNOSIS — M06321 Rheumatoid nodule, right elbow: Secondary | ICD-10-CM | POA: Diagnosis not present

## 2020-12-14 DIAGNOSIS — M0579 Rheumatoid arthritis with rheumatoid factor of multiple sites without organ or systems involvement: Secondary | ICD-10-CM | POA: Diagnosis not present

## 2020-12-14 DIAGNOSIS — F419 Anxiety disorder, unspecified: Secondary | ICD-10-CM

## 2020-12-14 DIAGNOSIS — Z79899 Other long term (current) drug therapy: Secondary | ICD-10-CM | POA: Diagnosis not present

## 2020-12-14 DIAGNOSIS — Z9889 Other specified postprocedural states: Secondary | ICD-10-CM

## 2020-12-14 DIAGNOSIS — M06322 Rheumatoid nodule, left elbow: Secondary | ICD-10-CM | POA: Diagnosis not present

## 2020-12-14 DIAGNOSIS — E7800 Pure hypercholesterolemia, unspecified: Secondary | ICD-10-CM

## 2020-12-14 DIAGNOSIS — M7062 Trochanteric bursitis, left hip: Secondary | ICD-10-CM

## 2020-12-14 DIAGNOSIS — Z111 Encounter for screening for respiratory tuberculosis: Secondary | ICD-10-CM

## 2020-12-14 DIAGNOSIS — G4730 Sleep apnea, unspecified: Secondary | ICD-10-CM

## 2020-12-14 DIAGNOSIS — Z8679 Personal history of other diseases of the circulatory system: Secondary | ICD-10-CM

## 2020-12-15 LAB — COMPLETE METABOLIC PANEL WITH GFR
AG Ratio: 1.4 (calc) (ref 1.0–2.5)
ALT: 25 U/L (ref 6–29)
AST: 29 U/L (ref 10–35)
Albumin: 4.4 g/dL (ref 3.6–5.1)
Alkaline phosphatase (APISO): 95 U/L (ref 37–153)
BUN: 13 mg/dL (ref 7–25)
CO2: 27 mmol/L (ref 20–32)
Calcium: 9.7 mg/dL (ref 8.6–10.4)
Chloride: 105 mmol/L (ref 98–110)
Creat: 0.67 mg/dL (ref 0.50–1.03)
Globulin: 3.1 g/dL (calc) (ref 1.9–3.7)
Glucose, Bld: 92 mg/dL (ref 65–99)
Potassium: 4.1 mmol/L (ref 3.5–5.3)
Sodium: 141 mmol/L (ref 135–146)
Total Bilirubin: 0.8 mg/dL (ref 0.2–1.2)
Total Protein: 7.5 g/dL (ref 6.1–8.1)
eGFR: 103 mL/min/{1.73_m2} (ref >=60)

## 2020-12-15 LAB — CBC WITH DIFFERENTIAL/PLATELET
Absolute Monocytes: 382 cells/uL (ref 200–950)
Basophils Absolute: 21 cells/uL (ref 0–200)
Basophils Relative: 0.5 %
Eosinophils Absolute: 21 cells/uL (ref 15–500)
Eosinophils Relative: 0.5 %
HCT: 41.1 % (ref 35.0–45.0)
Hemoglobin: 13.8 g/dL (ref 11.7–15.5)
Lymphs Abs: 916 cells/uL (ref 850–3900)
MCH: 31.6 pg (ref 27.0–33.0)
MCHC: 33.6 g/dL (ref 32.0–36.0)
MCV: 94.1 fL (ref 80.0–100.0)
MPV: 10.5 fL (ref 7.5–12.5)
Monocytes Relative: 9.1 %
Neutro Abs: 2860 cells/uL (ref 1500–7800)
Neutrophils Relative %: 68.1 %
Platelets: 357 10*3/uL (ref 140–400)
RBC: 4.37 10*6/uL (ref 3.80–5.10)
RDW: 13.3 % (ref 11.0–15.0)
Total Lymphocyte: 21.8 %
WBC: 4.2 10*3/uL (ref 3.8–10.8)

## 2020-12-15 NOTE — Progress Notes (Signed)
CBC and CMP WNL

## 2020-12-22 ENCOUNTER — Encounter: Payer: Self-pay | Admitting: Physician Assistant

## 2020-12-22 ENCOUNTER — Ambulatory Visit (INDEPENDENT_AMBULATORY_CARE_PROVIDER_SITE_OTHER): Payer: 59

## 2020-12-22 ENCOUNTER — Encounter (HOSPITAL_BASED_OUTPATIENT_CLINIC_OR_DEPARTMENT_OTHER): Payer: Self-pay

## 2020-12-22 ENCOUNTER — Telehealth (INDEPENDENT_AMBULATORY_CARE_PROVIDER_SITE_OTHER): Payer: 59 | Admitting: Physician Assistant

## 2020-12-22 ENCOUNTER — Telehealth: Payer: 59 | Admitting: Family Medicine

## 2020-12-22 DIAGNOSIS — U071 COVID-19: Secondary | ICD-10-CM | POA: Diagnosis not present

## 2020-12-22 DIAGNOSIS — I493 Ventricular premature depolarization: Secondary | ICD-10-CM

## 2020-12-22 MED ORDER — MOLNUPIRAVIR EUA 200MG CAPSULE: 4.0000 | ORAL_CAPSULE | Freq: Two times a day (BID) | ORAL | 0 refills | Status: AC

## 2020-12-22 NOTE — Progress Notes (Signed)
Virtual Visit via Video Note  I connected with  Vickie [name]  on 12/22/20 at 11:30 AM EDT by a video enabled telemedicine application and verified that I am speaking with the correct person using two identifiers.  Location: Patient: home Provider: Nature conservation officer at [employer] Persons present: Patient and myself   I discussed the limitations of evaluation and management by telemedicine and the availability of in person appointments. The patient expressed understanding and agreed to proceed.   History of Present Illness: Chief complaint: COVID-19 + (two positive tests at home yesterday)  Symptom onset: Yesterday morning Pertinent positives: Headache, nasal pressure, congestion, fatigue, cough, body aches  Pertinent negatives: SOB, chest pain, N/V/D Treatments tried: Aleve and Tylenol  Vaccine status: Pfizer x 4  Sick exposure: Recent travel  Hx of rheumatoid arthritis. She does not take her Rinvoq when she has infections.     Observations/Objective:   Gen: Awake, alert, no acute distress Resp: Breathing is even and non-labored Psych: calm/pleasant demeanor Neuro: Alert and Oriented x 3, + facial symmetry, speech is clear.   Assessment and Plan:  1. COVID-19 Diagnosis confirmed via home antigen test. We discussed current algorithm recommendations for prescribing outpatient antivirals.  As the patient is immunocompromised given her status of rheumatoid arthritis, Molnupiravir was agreed upon today as an antiviral therapy.  Possible side effects and risks versus benefits discussed.  Advised self-isolation at home for the next 5 days and then masking around others for at least an additional 5 days.  Treat supportively at this time including sleeping prone, deep breathing exercises, pushing fluids, walking every few hours, vitamins C and D, and Tylenol or ibuprofen as needed.  The patient understands that COVID-19 illness can wax and wane.  Should the symptoms acutely  worsen or patient starts to experience sudden shortness of breath, chest pain, severe weakness, the patient will go straight to the emergency department.  Also advised home pulse oximetry monitoring and for any reading consistently under 92%, should also report to the emergency department.  The patient will continue to keep [place] updated.    Follow Up Instructions:    I discussed the assessment and treatment plan with the patient. The patient was provided an opportunity to ask questions and all were answered. The patient agreed with the plan and demonstrated an understanding of the instructions.   The patient was advised to call back or seek an in-person evaluation if the symptoms worsen or if the condition fails to improve as anticipated.  Glenetta Kiger M Courtnei Ruddell, PA-C

## 2020-12-22 NOTE — Telephone Encounter (Signed)
Monitor uploaded and sent to Dr. Duke Salvia for review.

## 2020-12-28 ENCOUNTER — Encounter: Payer: Self-pay | Admitting: Family Medicine

## 2020-12-28 ENCOUNTER — Telehealth (INDEPENDENT_AMBULATORY_CARE_PROVIDER_SITE_OTHER): Payer: 59 | Admitting: Family Medicine

## 2020-12-28 ENCOUNTER — Telehealth: Payer: Self-pay

## 2020-12-28 DIAGNOSIS — R059 Cough, unspecified: Secondary | ICD-10-CM

## 2020-12-28 DIAGNOSIS — U071 COVID-19: Secondary | ICD-10-CM | POA: Diagnosis not present

## 2020-12-28 MED ORDER — BENZONATATE 100 MG PO CAPS: ORAL_CAPSULE | ORAL | 0 refills | Status: DC

## 2020-12-28 NOTE — Telephone Encounter (Signed)
Reviewed the following with patient.   If you test POSITIVE for COVID19 and have MILD to MODERATE symptoms: First, call your PCP if you would like to receive COVID19 treatment AND Hold your medications during the infection and for at least 1 week after your symptoms have resolved: Injectable medication (Benlysta, Cimzia, Cosentyx, Enbrel, Humira, Orencia, Remicade, Simponi, Stelara, Taltz, Tremfya) Methotrexate Leflunomide (Arava) Azathioprine Mycophenolate (Cellcept) Osborne [place], or Rinvoq Otezla If you take Actemra or Kevzara, you DO NOT need to hold these for COVID19 infection.  Patient states she has a lingering cough. Patient states her PCP advised her the cough could linger. No fever or congestion. Patient states her fever subsided on 12/25/18 and the congestion subsided on 12/26/2020. Please advise when patient may restart her Rinvoq.

## 2020-12-28 NOTE — Progress Notes (Signed)
Virtual Visit via Video Note  I connected with Vickie [name]  on 12/28/20 at  3:40 PM EDT by a video enabled telemedicine application and verified that I am speaking with the correct person using two identifiers.  Location patient: home, [place] Location provider:work or home office Persons participating in the virtual visit: patient, provider  I discussed the limitations of evaluation and management by telemedicine and the availability of in person appointments. The patient expressed understanding and agreed to proceed.   HPI:  Acute telemedicine visit for Covid: -Onset: about 1 week ago, tested positive for covid -she was seen last week and took an antiviral -Symptoms include: reports has improved quite a bit but has a persistent dry cough -Denies:CP, SOB, fevers, mucus production -Has tried: has not tried anything for this yet -Pertinent past medical history: see below -Pertinent medication allergies:  Allergies  Allergen Reactions   Latex    Sulfa Antibiotics   -COVID-19 vaccine status: Immunization History  Administered Date(s) Administered   Influenza Inj Mdck Quad Pf 11/28/2016   Influenza Split 12/17/2019   Influenza,inj,Quad PF,6+ Mos 12/05/2015, 12/19/2017, 11/12/2018   Influenza-Unspecified 12/06/2020   PFIZER Comirnaty(Gray Top)Covid-19 Tri-Sucrose Vaccine 05/31/2020   PFIZER(Purple Top)SARS-COV-2 Vaccination 05/10/2019, 06/07/2019, 11/12/2019   Tdap 09/26/2014   Zoster Recombinat (Shingrix) 11/12/2018, 03/25/2019     ROS: See pertinent positives and negatives per HPI.  Past Medical History:  Diagnosis Date   Anxiety 10/12/2020   Arthritis    RA Dx at age 1   Bell's palsy    Chicken pox    Hypertension    PVC (premature ventricular contraction) 01/02/2017   Rheumatoid arthritis (HCC)    Snoring 01/02/2017    Past Surgical History:  Procedure Laterality Date   BREAST BIOPSY Left    unsure of time  - ?2016 in [place]   CESAREAN SECTION     FOOT SURGERY Left  04/2019     Current Outpatient Medications:    amLODipine (NORVASC) 5 MG tablet, TAKE 1 TABLET BY MOUTH  DAILY, Disp: 90 tablet, Rfl: 3   benzonatate (TESSALON PERLES) 100 MG capsule, 1-2 capsules twice daily as needed for cough, Disp: 40 capsule, Rfl: 0   diclofenac sodium (VOLTAREN) 1 % GEL, Apply 2 g to 4 g to affected joints up to 4 times daily PRN, Disp: 4 Tube, Rfl: 2   famotidine (PEPCID) 40 MG tablet, Take 1 tablet (40 mg total) by mouth daily., Disp: 30 tablet, Rfl: 2   folic acid (FOLVITE) 1 MG tablet, Take 2 tablets (2 mg total) by mouth daily., Disp: 180 tablet, Rfl: 0   RINVOQ 15 MG TB24, TAKE 1 TABLET BY MOUTH  DAILY, Disp: 90 tablet, Rfl: 0  EXAM:  VITALS per patient if applicable:  GENERAL: alert, oriented, appears well and in no acute distress  HEENT: atraumatic, conjunttiva clear, no obvious abnormalities on inspection of external nose and ears  NECK: normal movements of the head and neck  LUNGS: on inspection no signs of respiratory distress, breathing rate appears normal, no obvious gross SOB, gasping or wheezing  CV: no obvious cyanosis  MS: moves all visible extremities without noticeable abnormality  PSYCH/NEURO: pleasant and cooperative, no obvious depression or anxiety, speech and thought processing grossly intact  ASSESSMENT AND PLAN:  Discussed the following assessment and plan:  Cough, unspecified type  COVID-19  -we discussed possible serious and likely etiologies, options for evaluation and workup, limitations of telemedicine visit vs in person visit, treatment, treatment risks and precautions. Pt is  agreeable to treatment via telemedicine at this moment. Glad she is improving. Opted to try some Tessalon for cough. Musinex if this does not work. Discussed potential complications.  Advised to seek prompt in person care if worsening, new symptoms arise, or if is not improving with treatment. Discussed options for inperson care if PCP office not  available. Did let this patient know that I only do telemedicine on Tuesdays and Thursdays for [facility] [address]. Advised to schedule follow up visit with PCP or [facility] if any further questions or concerns to avoid delays in care.   I discussed the assessment and treatment plan with the patient. The patient was provided an opportunity to ask questions and all were answered. The patient agreed with the plan and demonstrated an understanding of the instructions.     Terressa Koyanagi, DO

## 2020-12-28 NOTE — Telephone Encounter (Signed)
Patient left a voicemail stating she tested positive for Covid on 12/21/20 and requested a return call to let her know when she can restart her Rinvoq medication.

## 2020-12-28 NOTE — Telephone Encounter (Signed)
Patient advised Ladona Ridgel would recommend continuing to hold rinvoq for 1 more week.  If she continues to be symptomatic she should continue to hold rinvoq until her symptoms have completely resolved.  Patient expressed understanding.

## 2020-12-28 NOTE — Telephone Encounter (Signed)
I would recommend continuing to hold rinvoq for 1 more week.  If she continues to be symptomatic she should continue to hold rinvoq until her symptoms have completely resolved.

## 2020-12-28 NOTE — Patient Instructions (Signed)
-  I sent the medication(s) we discussed to your pharmacy: Meds ordered this encounter  Medications   benzonatate (TESSALON PERLES) 100 MG capsule    Sig: 1-2 capsules twice daily as needed for cough    Dispense:  40 capsule    Refill:  0   If this does not help you can try some musinex which is available over the counter.  I hope you are feeling better soon!  Seek in person care promptly if your symptoms worsen, new concerns arise or you are not improving with treatment.  It was nice to meet you today. I help [facility] out with telemedicine visits on Tuesdays and Thursdays and am available for visits on those days. If you have any concerns or questions following this visit please schedule a follow up visit with your Primary Care doctor or seek care at a local urgent care clinic to avoid delays in care.

## 2020-12-28 NOTE — Telephone Encounter (Signed)
Attempted to contact the patient and left message for patient to call the office.  

## 2021-01-07 ENCOUNTER — Other Ambulatory Visit: Payer: Self-pay | Admitting: Rheumatology

## 2021-01-07 DIAGNOSIS — M0579 Rheumatoid arthritis with rheumatoid factor of multiple sites without organ or systems involvement: Secondary | ICD-10-CM

## 2021-01-08 NOTE — Telephone Encounter (Signed)
Next Visit: 05/14/2021  Last Visit: 12/14/2020  Last Fill: 10/11/2020  [id number] arthritis involving multiple sites with positive rheumatoid factor   Current Dose per office note 12/14/2020: Rinvoq 15 mg 1 tablet by mouth daily  Labs: 12/14/2020 CBC and CMP WNL  TB Gold: 02/07/2020 Neg    Okay to refill Rinvoq?

## 2021-01-12 ENCOUNTER — Other Ambulatory Visit: Payer: Self-pay

## 2021-01-12 ENCOUNTER — Encounter (HOSPITAL_BASED_OUTPATIENT_CLINIC_OR_DEPARTMENT_OTHER): Payer: Self-pay | Admitting: Cardiovascular Disease

## 2021-01-12 ENCOUNTER — Ambulatory Visit (INDEPENDENT_AMBULATORY_CARE_PROVIDER_SITE_OTHER): Payer: 59 | Admitting: Cardiovascular Disease

## 2021-01-12 DIAGNOSIS — I493 Ventricular premature depolarization: Secondary | ICD-10-CM | POA: Diagnosis not present

## 2021-01-12 DIAGNOSIS — I10 Essential (primary) hypertension: Secondary | ICD-10-CM | POA: Diagnosis not present

## 2021-01-12 NOTE — Assessment & Plan Note (Signed)
Symptoms are much better controlled since her anxiety has been better.  No significant arrhythmias on her monitor.  She is going to work on increasing her exercise.

## 2021-01-12 NOTE — Progress Notes (Signed)
Cardiology Office Note:    Date:  01/12/2021   ID:  Vickie [name], DOB Dec 27, 1965, MRN [id number]  PCP:  [place], Betty G, MD   [facility] Providers Cardiologist:  Chilton Si, MD     Referring MD: [place], Betty G, MD   No chief complaint on file.   History of Present Illness:    Vickie [name] is a 55 y.o. female with a hx of rheumatoid arthritis, hypertension, and PVCs here for follow-up.  She was seen in 2018 for PVCs. She had an Echo 12/2016 that revealed LVEF 60-65% and was otherwise unremarkable. She had a home sleep study 02/2017 and was prescribed an oral airway device.  At her last appointment she was feeling mostly well and reported palpitations once or twice per month.  She was unsure whether may also be related to stress and anxiety.  TSH was normal.  She wore a 30-day monitor that showed less than 1% PACs and PVCs.  Lately she has been feeling well.  She has not been exercising as much lately due to having to have some other renovations around her home.  Her stress levels have been much better.  She also has rheumatoid arthritis and this has been better controlled lately.  She has not been feeling any palpitations.  She has no chest pain, shortness of breath, lower extremity edema, orthopnea, or PND.   Past Medical History:  Diagnosis Date   Anxiety 10/12/2020   Arthritis    RA Dx at age 71   Bell's palsy    Chicken pox    Hypertension    PVC (premature ventricular contraction) 01/02/2017   Rheumatoid arthritis (HCC)    Snoring 01/02/2017    Past Surgical History:  Procedure Laterality Date   BREAST BIOPSY Left    unsure of time  - ?2016 in [place]   CESAREAN SECTION     FOOT SURGERY Left 04/2019    Current Medications: Current Meds  Medication Sig   amLODipine (NORVASC) 5 MG tablet TAKE 1 TABLET BY MOUTH  DAILY   diclofenac sodium (VOLTAREN) 1 % GEL Apply 2 g to 4 g to affected joints up to 4 times daily PRN   famotidine (PEPCID) 40 MG tablet Take 1  tablet (40 mg total) by mouth daily.   folic acid (FOLVITE) 1 MG tablet Take 2 tablets (2 mg total) by mouth daily.   RINVOQ 15 MG TB24 TAKE 1 TABLET BY MOUTH  DAILY     Allergies:   Latex and Sulfa antibiotics   Social History   Socioeconomic History   Marital status: Married    Spouse name: Not on file   Number of children: Not on file   Years of education: Not on file   Highest education level: Not on file  Occupational History   Not on file  Tobacco Use   Smoking status: Never   Smokeless tobacco: Never  Vaping Use   Vaping Use: Never used  Substance and Sexual Activity   Alcohol use: Yes    Comment: occ   Drug use: No   Sexual activity: Not on file  Other Topics Concern   Not on file  Social History Narrative   Not on file   Social Determinants of Health   Financial Resource Strain: Not on file  Food Insecurity: Not on file  Transportation Needs: Not on file  Physical Activity: Not on file  Stress: Not on file  Social Connections: Not on file  Family History: The patient's family history includes Rheum arthritis in her mother. There is no history of Diabetes, Hypertension, Hyperlipidemia, Stroke, or Colon cancer.  ROS:   Please see the history of present illness.    (+) Palpitations (+) Anxiety/Stress (+) Snores All other systems reviewed and are negative.  EKGs/Labs/Other Studies Reviewed:    The following studies were reviewed today:  TTE 01/09/2017: - Left ventricle: The cavity size was normal. Wall thickness was    normal. Systolic function was normal. The estimated ejection    fraction was in the range of 60% to 65%. Wall motion was normal;    there were no regional wall motion abnormalities. Left    ventricular diastolic function parameters were normal.  - Mitral valve: There was mild regurgitation.   EKG:   10/12/2020: Sinus rhythm. Rate 71 bpm. 01/02/2017: Sinus rhythm. Rate 77 bpm. 12/06/2016: Sinus rhythm. Rate 73 bpm. 3 PVCs.  30  Day Event Monitor 11/2020:   Quality: Fair.  Baseline artifact. Predominant rhythm: sinus rhythm Average heart rate: 78 bpm Max heart rate: 111 bpm Min heart rate: 56 bpm Pauses >2.5 seconds: none   <1% PACs, PVCs No significant arrhythmias  Recent Labs: 10/12/2020: TSH 0.905 12/14/2020: ALT 25; BUN 13; Creat 0.67; Hemoglobin 13.8; Platelets 357; Potassium 4.1; Sodium 141  Recent Lipid Panel    Component Value Date/Time   CHOL 210 (H) 12/27/2019 1001   TRIG 90 12/27/2019 1001   HDL 88 12/27/2019 1001   CHOLHDL 2.4 12/27/2019 1001   VLDL 31.4 10/31/2017 0922   LDLCALC 104 (H) 12/27/2019 1001     Physical Exam:     VS:  BP 104/72 (BP Location: Left Arm, Patient Position: Sitting, Cuff Size: Normal)   Pulse 77   Ht 5\' 2"  (1.575 m)   Wt 143 lb 6.4 oz (65 kg)   SpO2 95%   BMI 26.23 kg/m  , BMI Body mass index is 26.23 kg/m. GENERAL:  Well appearing HEENT: Pupils equal round and reactive, fundi not visualized, oral mucosa unremarkable NECK:  No jugular venous distention, waveform within normal limits, carotid upstroke brisk and symmetric, no bruits, no thyromegaly LUNGS:  Clear to auscultation bilaterally HEART:  RRR.  PMI not displaced or sustained,S1 and S2 within normal limits, no S3, no S4, no clicks, no rubs, no murmurs ABD:  Flat, positive bowel sounds normal in frequency in pitch, no bruits, no rebound, no guarding, no midline pulsatile mass, no hepatomegaly, no splenomegaly EXT:  2 plus pulses throughout, no edema, no cyanosis no clubbing SKIN:  No rashes no nodules NEURO:  Cranial nerves II through XII grossly intact, motor grossly intact throughout PSYCH:  Cognitively intact, oriented to person place and time    ASSESSMENT:    1. Hypertension, essential   2. PVC (premature ventricular contraction)     PLAN:    In order of problems listed above: Hypertension, essential Blood pressure is very well controlled on amlodipine.  Continue current regimen.  I  also encouraged her to increase her exercise to 150 minutes weekly.  PVC (premature ventricular contraction) Symptoms are much better controlled since her anxiety has been better.  No significant arrhythmias on her monitor.  She is going to work on increasing her exercise.      Disposition: FU with Shamica Moree C. , MD, [facility] in 2-3 months.  Medication Adjustments/Labs and Tests Ordered: Current medicines are reviewed at length with the patient today.  Concerns regarding medicines are outlined above.   No orders  of the defined types were placed in this encounter.   No orders of the defined types were placed in this encounter.  Patient Instructions  Medication Instructions:  Your physician recommends that you continue on your current medications as directed. Please refer to the Current Medication list given to you today.   Labwork: NONE  Testing/Procedures: NONE  Follow-Up: AS NEEDED   before your next appointment, please call your pharmacy.     Signed, Skeet Latch, MD  01/12/2021 8:58 AM    [place]

## 2021-01-12 NOTE — Patient Instructions (Signed)
Medication Instructions:  Your physician recommends that you continue on your current medications as directed. Please refer to the Current Medication list given to you today.   Labwork: NONE  Testing/Procedures: NONE  Follow-Up: AS NEEDED   before your next appointment, please call your pharmacy.

## 2021-01-12 NOTE — Assessment & Plan Note (Addendum)
Blood pressure is very well controlled on amlodipine.  Continue current regimen.  I also encouraged her to increase her exercise to 150 minutes weekly.

## 2021-02-06 ENCOUNTER — Other Ambulatory Visit: Payer: Self-pay | Admitting: Family Medicine

## 2021-02-06 DIAGNOSIS — Z1231 Encounter for screening mammogram for malignant neoplasm of breast: Secondary | ICD-10-CM

## 2021-03-06 ENCOUNTER — Ambulatory Visit
Admission: RE | Admit: 2021-03-06 | Discharge: 2021-03-06 | Disposition: A | Discharge status: Home / Self Care | Payer: 59 | Source: Ambulatory Visit

## 2021-03-06 ENCOUNTER — Other Ambulatory Visit: Payer: Self-pay

## 2021-03-06 DIAGNOSIS — Z1231 Encounter for screening mammogram for malignant neoplasm of breast: Secondary | ICD-10-CM

## 2021-03-08 ENCOUNTER — Other Ambulatory Visit: Payer: Self-pay | Admitting: Family Medicine

## 2021-03-08 DIAGNOSIS — R928 Other abnormal and inconclusive findings on diagnostic imaging of breast: Secondary | ICD-10-CM

## 2021-03-30 ENCOUNTER — Telehealth: Payer: Self-pay | Admitting: *Deleted

## 2021-03-30 NOTE — Telephone Encounter (Signed)
Error

## 2021-04-03 ENCOUNTER — Ambulatory Visit
Admission: RE | Admit: 2021-04-03 | Discharge: 2021-04-03 | Disposition: A | Discharge status: Home / Self Care | Payer: 59 | Source: Ambulatory Visit | Attending: Family Medicine | Admitting: Family Medicine

## 2021-04-03 ENCOUNTER — Other Ambulatory Visit: Payer: Self-pay

## 2021-04-03 DIAGNOSIS — R928 Other abnormal and inconclusive findings on diagnostic imaging of breast: Secondary | ICD-10-CM

## 2021-04-11 NOTE — Telephone Encounter (Signed)
Patient seen by Dr Attica 12/2020 

## 2021-05-04 NOTE — Progress Notes (Unsigned)
Office Visit Note  Patient: Vickie [name]             Date of Birth: [birth year]           MRN: [id number]             PCP: [place], Betty G, MD Referring: [place], Betty G, MD Visit Date: 05/18/2021 Occupation: @GUAROCC @  Subjective:  No chief complaint on file.   History of Present Illness: Vickie [name] is a 56 y.o. female ***   Activities of Daily Living:  Patient reports morning stiffness for *** {minute/hour:19697}.   Patient {ACTIONS;DENIES/REPORTS:21021675::"Denies"} nocturnal pain.  Difficulty dressing/grooming: {ACTIONS;DENIES/REPORTS:21021675::"Denies"} Difficulty climbing stairs: {ACTIONS;DENIES/REPORTS:21021675::"Denies"} Difficulty getting out of chair: {ACTIONS;DENIES/REPORTS:21021675::"Denies"} Difficulty using hands for taps, buttons, cutlery, and/or writing: {ACTIONS;DENIES/REPORTS:21021675::"Denies"}  No Rheumatology ROS completed.   PMFS History:  Patient Active Problem List   Diagnosis Date Noted   Anxiety 10/12/2020   PVC (premature ventricular contraction) 01/02/2017   Snoring 01/02/2017   High risk medication use 10/24/2016   Calcaneal spur of both feet 10/24/2016   Rheumatoid arthritis (HCC) 08/07/2016   Hypertension, essential 08/07/2016    Past Medical History:  Diagnosis Date   Anxiety 10/12/2020   Arthritis    RA Dx at age 69   Bell's palsy    Chicken pox    Hypertension    PVC (premature ventricular contraction) 01/02/2017   Rheumatoid arthritis (HCC)    Snoring 01/02/2017    Family History  Problem Relation Age of Onset   Rheum arthritis Mother    Diabetes Neg Hx    Hypertension Neg Hx    Hyperlipidemia Neg Hx    Stroke Neg Hx    Colon cancer Neg Hx    Past Surgical History:  Procedure Laterality Date   BREAST BIOPSY Left    unsure of time  - ?2016 in [place]   CESAREAN SECTION     FOOT SURGERY Left 04/2019   Social History   Social History Narrative   Not on file   Immunization History  Administered Date(s) Administered    Influenza Inj Mdck Quad Pf 11/28/2016   Influenza Split 12/17/2019   Influenza,inj,Quad PF,6+ Mos 12/05/2015, 12/19/2017, 11/12/2018   Influenza-Unspecified 12/06/2020   PFIZER Comirnaty(Gray Top)Covid-19 Tri-Sucrose Vaccine 05/31/2020   PFIZER(Purple Top)SARS-COV-2 Vaccination 05/10/2019, 06/07/2019, 11/12/2019   Tdap 09/26/2014   Zoster Recombinat (Shingrix) 11/12/2018, 03/25/2019     Objective: Vital Signs: There were no vitals taken for this visit.   Physical Exam   Musculoskeletal Exam: ***  CDAI Exam: CDAI Score: -- Patient Global: --; Provider Global: -- Swollen: --; Tender: -- Joint Exam 05/18/2021   No joint exam has been documented for this visit   There is currently no information documented on the homunculus. Go to the Rheumatology activity and complete the homunculus joint exam.  Investigation: No additional findings.  Imaging: No results found.  Recent Labs: Lab Results  Component Value Date   WBC 4.2 12/14/2020   HGB 13.8 12/14/2020   PLT 357 12/14/2020   NA 141 12/14/2020   K 4.1 12/14/2020   CL 105 12/14/2020   CO2 27 12/14/2020   GLUCOSE 92 12/14/2020   BUN 13 12/14/2020   CREATININE 0.67 12/14/2020   BILITOT 0.8 12/14/2020   ALKPHOS 79 01/02/2017   AST 29 12/14/2020   ALT 25 12/14/2020   PROT 7.5 12/14/2020   ALBUMIN 4.3 01/02/2017   CALCIUM 9.7 12/14/2020   GFRAA 116 08/25/2020   QFTBGOLDPLUS NEGATIVE 02/07/2020    Speciality  Comments: No specialty comments available.  Procedures:  No procedures performed Allergies: Latex and Sulfa antibiotics   Assessment / Plan:     Visit Diagnoses: Rheumatoid arthritis involving multiple sites with positive rheumatoid factor (HCC)  High risk medication use  Elevated LFTs  Rheumatoid nodule, right elbow (HCC)  Rheumatoid nodule, left elbow (HCC)  S/P bunionectomy  Trochanteric bursitis, left hip  Calcaneal spur of both feet  Metatarsalgia of both feet  Anxiety  History of  hypertension  Elevated cholesterol  Orders: No orders of the defined types were placed in this encounter.  No orders of the defined types were placed in this encounter.   Face-to-face time spent with patient was *** minutes. Greater than 50% of time was spent in counseling and coordination of care.  Follow-Up Instructions: No follow-ups on file.   Ofilia Neas, PA-C  Note - This record has been created using Dragon software.  Chart creation errors have been sought, but may not always  have been located. Such creation errors do not reflect on  the standard of medical care.

## 2021-05-14 ENCOUNTER — Ambulatory Visit: Payer: 59 | Admitting: Rheumatology

## 2021-05-18 ENCOUNTER — Ambulatory Visit (INDEPENDENT_AMBULATORY_CARE_PROVIDER_SITE_OTHER): Payer: 59 | Admitting: Rheumatology

## 2021-05-18 ENCOUNTER — Ambulatory Visit (INDEPENDENT_AMBULATORY_CARE_PROVIDER_SITE_OTHER): Payer: 59

## 2021-05-18 ENCOUNTER — Encounter: Payer: Self-pay | Admitting: Rheumatology

## 2021-05-18 ENCOUNTER — Other Ambulatory Visit: Payer: Self-pay

## 2021-05-18 ENCOUNTER — Telehealth: Payer: Self-pay | Admitting: Family Medicine

## 2021-05-18 VITALS — BP 122/85 | HR 98 | Ht 62.0 in | Wt 139.6 lb

## 2021-05-18 DIAGNOSIS — M0579 Rheumatoid arthritis with rheumatoid factor of multiple sites without organ or systems involvement: Secondary | ICD-10-CM

## 2021-05-18 DIAGNOSIS — M7741 Metatarsalgia, right foot: Secondary | ICD-10-CM

## 2021-05-18 DIAGNOSIS — Z79899 Other long term (current) drug therapy: Secondary | ICD-10-CM | POA: Diagnosis not present

## 2021-05-18 DIAGNOSIS — M79672 Pain in left foot: Secondary | ICD-10-CM

## 2021-05-18 DIAGNOSIS — M7062 Trochanteric bursitis, left hip: Secondary | ICD-10-CM

## 2021-05-18 DIAGNOSIS — M79641 Pain in right hand: Secondary | ICD-10-CM

## 2021-05-18 DIAGNOSIS — Z9889 Other specified postprocedural states: Secondary | ICD-10-CM

## 2021-05-18 DIAGNOSIS — M06321 Rheumatoid nodule, right elbow: Secondary | ICD-10-CM

## 2021-05-18 DIAGNOSIS — M7731 Calcaneal spur, right foot: Secondary | ICD-10-CM

## 2021-05-18 DIAGNOSIS — Z8679 Personal history of other diseases of the circulatory system: Secondary | ICD-10-CM

## 2021-05-18 DIAGNOSIS — R7989 Other specified abnormal findings of blood chemistry: Secondary | ICD-10-CM

## 2021-05-18 DIAGNOSIS — M06322 Rheumatoid nodule, left elbow: Secondary | ICD-10-CM

## 2021-05-18 DIAGNOSIS — M79642 Pain in left hand: Secondary | ICD-10-CM

## 2021-05-18 DIAGNOSIS — M79671 Pain in right foot: Secondary | ICD-10-CM

## 2021-05-18 DIAGNOSIS — M7732 Calcaneal spur, left foot: Secondary | ICD-10-CM

## 2021-05-18 DIAGNOSIS — M7742 Metatarsalgia, left foot: Secondary | ICD-10-CM

## 2021-05-18 DIAGNOSIS — E7800 Pure hypercholesterolemia, unspecified: Secondary | ICD-10-CM

## 2021-05-18 DIAGNOSIS — F419 Anxiety disorder, unspecified: Secondary | ICD-10-CM

## 2021-05-18 NOTE — Telephone Encounter (Signed)
Pt wanted her labs result from 1/25 that was faxed over resulted by doctor [place]. Please advise. Pt stated that Dr. Sw[place]il[place] does this for her every year.  ?

## 2021-05-18 NOTE — Patient Instructions (Signed)
Standing Labs ?We placed an order today for your standing lab work.  ? ?Please have your standing labs drawn in June and every 3 months ? ?If possible, please have your labs drawn 2 weeks prior to your appointment so that the provider can discuss your results at your appointment. ? ?Please note that you may see your imaging and lab results in MyChart before we have reviewed them. ?We may be awaiting multiple results to interpret others before contacting you. ?Please allow our office up to 72 hours to thoroughly review all of the results before contacting the office for clarification of your results. ? ?We have open lab daily: ?Monday through Thursday from 1:30-4:30 PM and Friday from 1:30-4:00 PM ?at the office of Dr. Suleika Donavan, [facility].   ?Please be advised, all patients with office appointments requiring lab work will take precedent over walk-in lab work.  ?If possible, please come for your lab work on Monday and Friday afternoons, as you may experience shorter wait times. ?The office is located at [address], [address], [place], [place] [address] ?No appointment is necessary.   ?Labs are drawn by [facility]. Please bring your co-pay at the time of your lab draw.  You may receive a bill from [facility] for your lab work. ? ?Please note if you are on Hydroxychloroquine and and an order has been placed for a Hydroxychloroquine level, you will need to have it drawn 4 hours or more after your last dose. ? ?If you wish to have your labs drawn at another location, please call the office 24 hours in advance to send orders. ? ?If you have any questions regarding directions or hours of operation,  ?please call [phone].   ?As a reminder, please drink plenty of water prior to coming for your lab work. Thanks!  ? ?Vaccines ?You are taking a medication(s) that can suppress your immune system.  The following immunizations are recommended: ?Flu annually ?Covid-19  ?Td/Tdap (tetanus, diphtheria, pertussis)  every 10 years ?Pneumonia (Prevnar 15 then Pneumovax 23 at least 1 year apart.  Alternatively, can take Prevnar 20 without needing additional dose) ?Shingrix: 2 doses from 4 weeks to 6 months apart ? ?Please check with your PCP to make sure you are up to date.  ? ?If you have signs or symptoms of an infection or start antibiotics: ?First, call your PCP for workup of your infection. ?Hold your medication through the infection, until you complete your antibiotics, and until symptoms resolve if you take the following: ?Injectable medication (Actemra, Benlysta, Cimzia, Cosentyx, Enbrel, Humira, Kevzara, Orencia, Remicade, Simponi, Stelara, Taltz, Tremfya) ?Methotrexate ?Leflunomide (Arava) ?Mycophenolate (Cellcept) ?Xeljanz, Olumiant, or Rinvoq  ? ? ?Heart Disease Prevention  ?Your inflammatory disease increases your risk of heart disease which includes heart attack, stroke, atrial fibrillation (irregular heartbeats), high blood pressure, heart failure and atherosclerosis (plaque in the arteries).  It is important to reduce your risk by:  ? ?Keep blood pressure, cholesterol, and blood sugar at healthy levels  ? ?Smoking Cessation  ? ?Maintain a healthy weight  ?BMI 20-25  ? ?Eat a healthy diet  ?Plenty of fresh fruit, vegetables, and whole grains  ?Limit saturated fats, foods high in sodium, and added sugars  ?DASH and Mediterranean diet  ? ?Increase physical activity  ?Recommend moderate physically activity for 150 minutes per week/ 30 minutes a day for five days a week These can be broken up into three separate ten-minute sessions during the day.  ? ?Reduce Stress  ?Meditation,   slow breathing exercises, yoga, coloring books  ?Dental visits twice a year   ?

## 2021-05-18 NOTE — Telephone Encounter (Signed)
Message routed to PCP.

## 2021-05-18 NOTE — Telephone Encounter (Signed)
Pt call and stated she want a call back about her labs she stated no one call her with her results. ?

## 2021-05-19 ENCOUNTER — Other Ambulatory Visit: Payer: Self-pay | Admitting: Rheumatology

## 2021-05-19 DIAGNOSIS — M0579 Rheumatoid arthritis with rheumatoid factor of multiple sites without organ or systems involvement: Secondary | ICD-10-CM

## 2021-05-20 NOTE — Progress Notes (Signed)
CBC and CMP are normal.

## 2021-05-21 NOTE — Telephone Encounter (Signed)
I received FLP results of labs done on 03/21/21. ?Cholesterol mildly elevated. ?We can discuss results next follow up. ?Last CPE 12/27/19. ?Acute visit for cough 07/26/20. ?Thanks, ?BJ ? ?

## 2021-05-21 NOTE — Telephone Encounter (Signed)
Left message to return phone call.

## 2021-05-21 NOTE — Telephone Encounter (Signed)
Next Visit: 10/18/2021 ? ?Last Visit: 05/18/2021 ? ?Last Fill: 01/08/2021 ? ?DX: Rheumatoid arthritis involving multiple sites with positive rheumatoid factor  ? ?Current Dose per office note 05/18/2021: Rinvoq 15 mg 1 tablet by mouth daily ? ?Labs: 05/18/2021 CBC and CMP are normal. ? ?TB Gold: 02/07/2020 Neg (updated 05/18/2021, results pending) ? ?Okay to refill Rinvoq?  ?

## 2021-05-22 NOTE — Telephone Encounter (Signed)
I spoke with patient, we went over information below and she verbalized understanding.  ?

## 2021-05-22 NOTE — Telephone Encounter (Signed)
I left patient a voicemail to return my call. 

## 2021-05-23 LAB — CBC WITH DIFFERENTIAL/PLATELET
Absolute Monocytes: 614 cells/uL (ref 200–950)
Basophils Absolute: 21 cells/uL (ref 0–200)
Basophils Relative: 0.3 %
Eosinophils Absolute: 7 cells/uL — ABNORMAL LOW (ref 15–500)
Eosinophils Relative: 0.1 %
HCT: 41.2 % (ref 35.0–45.0)
Hemoglobin: 13.7 g/dL (ref 11.7–15.5)
Lymphs Abs: 1194 cells/uL (ref 850–3900)
MCH: 31.2 pg (ref 27.0–33.0)
MCHC: 33.3 g/dL (ref 32.0–36.0)
MCV: 93.8 fL (ref 80.0–100.0)
MPV: 10.5 fL (ref 7.5–12.5)
Monocytes Relative: 8.9 %
Neutro Abs: 5065 cells/uL (ref 1500–7800)
Neutrophils Relative %: 73.4 %
Platelets: 337 10*3/uL (ref 140–400)
RBC: 4.39 10*6/uL (ref 3.80–5.10)
RDW: 13.1 % (ref 11.0–15.0)
Total Lymphocyte: 17.3 %
WBC: 6.9 10*3/uL (ref 3.8–10.8)

## 2021-05-23 LAB — COMPLETE METABOLIC PANEL WITH GFR
AG Ratio: 1.4 (calc) (ref 1.0–2.5)
ALT: 15 U/L (ref 6–29)
AST: 24 U/L (ref 10–35)
Albumin: 4.7 g/dL (ref 3.6–5.1)
Alkaline phosphatase (APISO): 87 U/L (ref 37–153)
BUN: 13 mg/dL (ref 7–25)
CO2: 26 mmol/L (ref 20–32)
Calcium: 10.1 mg/dL (ref 8.6–10.4)
Chloride: 104 mmol/L (ref 98–110)
Creat: 0.73 mg/dL (ref 0.50–1.03)
Globulin: 3.3 g/dL (calc) (ref 1.9–3.7)
Glucose, Bld: 92 mg/dL (ref 65–99)
Potassium: 4.3 mmol/L (ref 3.5–5.3)
Sodium: 139 mmol/L (ref 135–146)
Total Bilirubin: 0.8 mg/dL (ref 0.2–1.2)
Total Protein: 8 g/dL (ref 6.1–8.1)
eGFR: 96 mL/min/{1.73_m2} (ref >=60)

## 2021-05-23 LAB — QUANTIFERON-TB GOLD PLUS
Mitogen-NIL: 5.38 IU/mL
NIL: 0.12 IU/mL
QuantiFERON-TB Gold Plus: NEGATIVE
TB1-NIL: <0 IU/mL
TB2-NIL: 0 IU/mL

## 2021-05-24 NOTE — Progress Notes (Signed)
TB Gold is negative.

## 2021-09-13 ENCOUNTER — Telehealth: Payer: Self-pay | Admitting: *Deleted

## 2021-09-13 ENCOUNTER — Other Ambulatory Visit: Payer: Self-pay | Admitting: Physician Assistant

## 2021-09-13 DIAGNOSIS — Z79899 Other long term (current) drug therapy: Secondary | ICD-10-CM

## 2021-09-13 DIAGNOSIS — M0579 Rheumatoid arthritis with rheumatoid factor of multiple sites without organ or systems involvement: Secondary | ICD-10-CM

## 2021-09-13 NOTE — Telephone Encounter (Signed)
Received fax from [facility] stating they have been trying to reach patient to refill Rinvoq. They have been unsuccessful in reaching patient. Call back number is [phone]. Patient advised to contact the pharmacy to set up shipment.

## 2021-09-13 NOTE — Telephone Encounter (Signed)
Next Visit: 10/18/2021  Last Visit: 05/18/2021  Last Fill: 05/21/2021  DX: Rheumatoid arthritis involving multiple sites with positive rheumatoid factor   Current Dose per office note 05/18/2021: Rinvoq 15 mg 1 tablet by mouth daily.   Labs: 05/18/2021, CBC and CMP are normal.  TB Gold: 05/18/2021, TB Gold is negative.  Okay to refill Rinvoq?

## 2021-09-13 NOTE — Telephone Encounter (Signed)
I called patient to update labs.

## 2021-09-13 NOTE — Telephone Encounter (Signed)
Patient is due to update lab work-CBC and CMP.  Ok to refill 30 day supply of rinvoq.

## 2021-09-14 ENCOUNTER — Encounter: Payer: Self-pay | Admitting: Family Medicine

## 2021-09-14 ENCOUNTER — Telehealth (INDEPENDENT_AMBULATORY_CARE_PROVIDER_SITE_OTHER): Payer: 59 | Admitting: Family Medicine

## 2021-09-14 VITALS — Ht 62.0 in

## 2021-09-14 DIAGNOSIS — I10 Essential (primary) hypertension: Secondary | ICD-10-CM | POA: Diagnosis not present

## 2021-09-14 DIAGNOSIS — M545 Low back pain, unspecified: Secondary | ICD-10-CM

## 2021-09-14 MED ORDER — MELOXICAM 15 MG PO TABS: 15.0000 mg | ORAL_TABLET | Freq: Every day | ORAL | 0 refills | Status: DC | PRN

## 2021-09-14 MED ORDER — CYCLOBENZAPRINE HCL 10 MG PO TABS: 5.0000 mg | ORAL_TABLET | Freq: Three times a day (TID) | ORAL | 0 refills | Status: DC | PRN

## 2021-09-14 NOTE — Progress Notes (Signed)
Virtual Visit via Video Note I connected with Vickie [name] on 09/14/21 by a video enabled telemedicine application and verified that I am speaking with the correct person using two identifiers.  Location patient: home Location provider:work office Persons participating in the virtual visit: patient, provider  I discussed the limitations of evaluation and management by telemedicine and the availability of in person appointments. The patient expressed understanding and agreed to proceed.  Chief Complaint  Patient presents with   Back Pain    Lower back pain x 2 weeks; has used pain relievers, patches & exercise.    HPI: Vickie [name] is a 56 year old female with history of GERD, hypertension, RA, and PVCs complaining of lower back pain as described above. She has had the pain intermittently for a while but this time seems to be more persistent. Pain is intermittently, not radiated, middle of lower back and left-sided, achy pain, 7-8/10. Exacerbated by sitting and alleviated by walking and lying down.  Negative for recent injury or unusual level of activity.  No associated LE numbness, tingling, urinary incontinence or retention, stool incontinence, or saddle anesthesia. Negative for fever, abnormal weight loss, unusual fatigue, abdominal pain, nausea, vomiting, changes in bowel habits, urinary symptoms, rash or edema on area.   OTC medications: She has tried Aleve, which helps. Lumbar x-ray done in 08/2019 at her rheumatologist'soffice: Lumbar scoliosis noted, multilevel spondylosis and facet joint arthropathy.  Hypertension on amlodipine 5 mg daily.. BP is at her rheumatologist office has been in normal range, 120s/80s. Negative for CP, dyspnea, edema, focal neurologic deficit.  Lab Results  Component Value Date   CREATININE 0.73 05/18/2021   BUN 13 05/18/2021   NA 139 05/18/2021   K 4.3 05/18/2021   CL 104 05/18/2021   CO2 26 05/18/2021   ROS: See pertinent positives and negatives  per HPI.  Past Medical History:  Diagnosis Date   Anxiety 10/12/2020   Arthritis    RA Dx at age 18   Bell's palsy    Chicken pox    Hypertension    PVC (premature ventricular contraction) 01/02/2017   Rheumatoid arthritis (HCC)    Snoring 01/02/2017   Past Surgical History:  Procedure Laterality Date   BREAST BIOPSY Left    unsure of time  - ?2016 in [place]   CESAREAN SECTION     FOOT SURGERY Left 04/2019   Family History  Problem Relation Age of Onset   Rheum arthritis Mother    Diabetes Neg Hx    Hypertension Neg Hx    Hyperlipidemia Neg Hx    Stroke Neg Hx    Colon cancer Neg Hx     Social History   Socioeconomic History   Marital status: Married    Spouse name: Not on file   Number of children: Not on file   Years of education: Not on file   Highest education level: Not on file  Occupational History   Not on file  Tobacco Use   Smoking status: Never    Passive exposure: Never   Smokeless tobacco: Never  Vaping Use   Vaping Use: Never used  Substance and Sexual Activity   Alcohol use: Yes    Comment: occ   Drug use: No   Sexual activity: Not on file  Other Topics Concern   Not on file  Social History Narrative   Not on file   Social Determinants of Health   Financial Resource Strain: Not on file  Food Insecurity: Not on  file  Transportation Needs: Not on file  Physical Activity: Not on file  Stress: Not on file  Social Connections: Not on file  Intimate Partner Violence: Not on file   Current Outpatient Medications:    amLODipine (NORVASC) 5 MG tablet, TAKE 1 TABLET BY MOUTH  DAILY, Disp: 90 tablet, Rfl: 3   diclofenac sodium (VOLTAREN) 1 % GEL, Apply 2 g to 4 g to affected joints up to 4 times daily PRN, Disp: 4 Tube, Rfl: 2   famotidine (PEPCID) 40 MG tablet, Take 1 tablet (40 mg total) by mouth daily., Disp: 30 tablet, Rfl: 2   folic acid (FOLVITE) 1 MG tablet, Take 2 tablets (2 mg total) by mouth daily., Disp: 180 tablet, Rfl: 0    Upadacitinib ER (RINVOQ) 15 MG TB24, TAKE 1 TABLET BY MOUTH DAILY, Disp: 30 tablet, Rfl: 0  EXAM:  VITALS per patient if applicable:Ht 5\' 2"  (1.575 m)   BMI 25.53 kg/m   GENERAL: alert, oriented, appears well and in no acute distress  HEENT: atraumatic, conjunctiva clear, no obvious abnormalities on inspection.  NECK: normal movements of the head and neck  LUNGS: on inspection no signs of respiratory distress, breathing rate appears normal, no obvious gross SOB, gasping or wheezing  CV: no obvious cyanosis  MS: moves all visible extremities without noticeable abnormality  PSYCH/NEURO: pleasant and cooperative, no obvious depression or anxiety, speech and thought processing grossly intact  ASSESSMENT AND PLAN:  Discussed the following assessment and plan:  Bilateral low back pain without sciatica, unspecified chronicity - Plan: meloxicam (MOBIC) 15 MG tablet, cyclobenzaprine (FLEXERIL) 10 MG tablet, Ambulatory referral to Physical Therapy Chronic with acute exacerbation. I do not think we need to have imaging at this time. History does not suggest a serious process. Because history of GERD, recommend Mobic instead of Aleve, 15 mg daily for 7 to 10 days and then daily as needed. Flexeril 10 mg 1/2 to 1 tablet daily 3 times daily as needed or chills at bedtime. PT will be arranged. We discussed side effects of medications. If pain is not back to baseline in 4 to 6 weeks, Ortho evaluation will be considered.  Hypertension, essential We discussed some side effects of NSAIDs. Recommend monitoring BP regularly. Continue amlodipine 5 mg daily. She has blood work regularly at her rheumatologist's office.  We discussed possible serious and likely etiologies, options for evaluation and workup, limitations of telemedicine visit vs in person visit, treatment, treatment risks and precautions. The patient was advised to call back or seek an in-person evaluation if the symptoms worsen or  if the condition fails to improve as anticipated. I discussed the assessment and treatment plan with the patient. The patient was provided an opportunity to ask questions and all were answered. The patient agreed with the plan and demonstrated an understanding of the instructions.  Return if symptoms worsen or fail to improve, for Keep next f/u appt..  Pahola Dimmitt G. , MD  [facility].

## 2021-09-18 ENCOUNTER — Telehealth: Payer: Self-pay | Admitting: Pharmacist

## 2021-09-18 NOTE — Telephone Encounter (Signed)
Received notification from [facility] regarding a prior authorization for [facility] Authorization has been APPROVED from 09/18/21 to 09/19/22.   Patient must continue to fill through [facility]: [phone]   Authorization #  [id number]  Chesley Mires, PharmD, MPH, BCPS, CPP Clinical Pharmacist (Rheumatology and Pulmonology)

## 2021-09-18 NOTE — Telephone Encounter (Signed)
Submitted a Prior Authorization RENEWAL request to [facility] for RINVOQ via CoverMyMeds. Will update once we receive a response.  Key: [id number]  Chesley Mires, PharmD, MPH, BCPS, CPP Clinical Pharmacist (Rheumatology and Pulmonology)

## 2021-09-20 ENCOUNTER — Other Ambulatory Visit: Payer: Self-pay | Admitting: *Deleted

## 2021-09-20 DIAGNOSIS — Z79899 Other long term (current) drug therapy: Secondary | ICD-10-CM

## 2021-09-21 LAB — COMPLETE METABOLIC PANEL WITH GFR
AG Ratio: 1.5 (calc) (ref 1.0–2.5)
ALT: 15 U/L (ref 6–29)
AST: 24 U/L (ref 10–35)
Albumin: 4.4 g/dL (ref 3.6–5.1)
Alkaline phosphatase (APISO): 78 U/L (ref 37–153)
BUN: 17 mg/dL (ref 7–25)
CO2: 27 mmol/L (ref 20–32)
Calcium: 9.9 mg/dL (ref 8.6–10.4)
Chloride: 106 mmol/L (ref 98–110)
Creat: 0.66 mg/dL (ref 0.50–1.03)
Globulin: 2.9 g/dL (calc) (ref 1.9–3.7)
Glucose, Bld: 90 mg/dL (ref 65–99)
Potassium: 4.3 mmol/L (ref 3.5–5.3)
Sodium: 141 mmol/L (ref 135–146)
Total Bilirubin: 0.9 mg/dL (ref 0.2–1.2)
Total Protein: 7.3 g/dL (ref 6.1–8.1)
eGFR: 103 mL/min/{1.73_m2} (ref >=60)

## 2021-09-21 LAB — CBC WITH DIFFERENTIAL/PLATELET
Absolute Monocytes: 260 cells/uL (ref 200–950)
Basophils Absolute: 19 cells/uL (ref 0–200)
Basophils Relative: 0.6 %
Eosinophils Absolute: 9 cells/uL — ABNORMAL LOW (ref 15–500)
Eosinophils Relative: 0.3 %
HCT: 40.2 % (ref 35.0–45.0)
Hemoglobin: 13.5 g/dL (ref 11.7–15.5)
Lymphs Abs: 1445 cells/uL (ref 850–3900)
MCH: 31 pg (ref 27.0–33.0)
MCHC: 33.6 g/dL (ref 32.0–36.0)
MCV: 92.2 fL (ref 80.0–100.0)
MPV: 9.9 fL (ref 7.5–12.5)
Monocytes Relative: 8.4 %
Neutro Abs: 1367 cells/uL — ABNORMAL LOW (ref 1500–7800)
Neutrophils Relative %: 44.1 %
Platelets: 370 10*3/uL (ref 140–400)
RBC: 4.36 10*6/uL (ref 3.80–5.10)
RDW: 12.8 % (ref 11.0–15.0)
Total Lymphocyte: 46.6 %
WBC: 3.1 10*3/uL — ABNORMAL LOW (ref 3.8–10.8)

## 2021-09-21 NOTE — Progress Notes (Signed)
White cell count is low most likely due to the use of Rinvoq .  CMP is normal.  We will continue to monitor labs.

## 2021-10-01 ENCOUNTER — Encounter (HOSPITAL_BASED_OUTPATIENT_CLINIC_OR_DEPARTMENT_OTHER): Payer: Self-pay | Admitting: Physical Therapy

## 2021-10-01 ENCOUNTER — Ambulatory Visit (HOSPITAL_BASED_OUTPATIENT_CLINIC_OR_DEPARTMENT_OTHER): Payer: 59 | Attending: Family Medicine | Admitting: Physical Therapy

## 2021-10-01 DIAGNOSIS — M6281 Muscle weakness (generalized): Secondary | ICD-10-CM | POA: Insufficient documentation

## 2021-10-01 DIAGNOSIS — R293 Abnormal posture: Secondary | ICD-10-CM | POA: Diagnosis not present

## 2021-10-01 DIAGNOSIS — M545 Low back pain, unspecified: Secondary | ICD-10-CM | POA: Insufficient documentation

## 2021-10-01 DIAGNOSIS — M5459 Other low back pain: Secondary | ICD-10-CM | POA: Diagnosis not present

## 2021-10-01 NOTE — Therapy (Signed)
OUTPATIENT PHYSICAL THERAPY THORACOLUMBAR EVALUATION   Patient Name: Vickie [name] MRN: [id number] DOB:1965-02-28, 56 y.o., female Today's Date: 10/01/2021   PT End of Session - 10/01/21 1346     Visit Number 1    Number of Visits 13    Date for PT Re-Evaluation 11/12/21    Authorization Type [employer]    Authorization Time Period 10/01/21 to 11/12/21    Authorization - Number of Visits 20    PT Start Time 1302    PT Stop Time 1342    PT Time Calculation (min) 40 min    Activity Tolerance Patient tolerated treatment well    Behavior During Therapy [facility] for tasks assessed/performed             Past Medical History:  Diagnosis Date   Anxiety 10/12/2020   Arthritis    RA Dx at age 26   Bell's palsy    Chicken pox    Hypertension    PVC (premature ventricular contraction) 01/02/2017   Rheumatoid arthritis (HCC)    Snoring 01/02/2017   Past Surgical History:  Procedure Laterality Date   BREAST BIOPSY Left    unsure of time  - ?2016 in [place]   CESAREAN SECTION     FOOT SURGERY Left 04/2019   Patient Active Problem List   Diagnosis Date Noted   Anxiety 10/12/2020   PVC (premature ventricular contraction) 01/02/2017   Snoring 01/02/2017   High risk medication use 10/24/2016   Calcaneal spur of both feet 10/24/2016   Rheumatoid arthritis (HCC) 08/07/2016   Hypertension, essential 08/07/2016    PCP: [name], Vickie G, MD   REFERRING PROVIDER: [place], Vickie G, MD   REFERRING DIAG: M54.50 (ICD-10-CM) - Bilateral low back pain without sciatica, unspecified chronicity   Rationale for Evaluation and Treatment Rehabilitation  THERAPY DIAG:  Other low back pain  Muscle weakness (generalized)  Abnormal posture  ONSET DATE: 09/14/2021   SUBJECTIVE:                                                                                                                                                                                           SUBJECTIVE STATEMENT: I wish I knew what  was going on with my back, it shifts, it goes away; it can be very random. I finally felt good this weekend and went on  a hike and by the time I went home I hurt again. 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 No numbness going down legs. No unintentional weight loss,  no bowel or bladder issues, no saddle numbness. It feels better when I stand. I've been taking all sorts of pain relievers, heat/cold, I've tried everything. I do have RA. I've been working on some low back exercises too like cat cow, child's pose, rotation stretch, etc  PERTINENT HISTORY:  Vickie [name] is a 56 year old female with history of GERD, hypertension, RA, and PVCs complaining of lower back pain as described above. She has had the pain intermittently for a while but this time seems to be more persistent. Pain is intermittently, not radiated, middle of lower back and left-sided, achy pain, 7-8/10. Exacerbated by sitting and alleviated by walking and lying down.   Negative for recent injury or unusual level of activity.   No associated LE numbness, tingling, urinary incontinence or retention, stool incontinence, or saddle anesthesia. Negative for fever, abnormal weight loss, unusual fatigue, abdominal pain, nausea, vomiting, changes in bowel habits, urinary symptoms, rash or edema on area.    OTC medications: She has tried Aleve, which helps. Lumbar x-ray done in 08/2019 at her rheumatologist'soffice: Lumbar scoliosis noted, multilevel spondylosis and facet joint arthropathy.   Hypertension on amlodipine 5 mg daily.. BP is at her rheumatologist office has been in normal range, 120s/80s. Negative for CP, dyspnea, edema, focal neurologic deficit.  PAIN:  Are you having pain? Yes: NPRS scale: 6/10 Pain location: both sides of the low back today Pain description: dull and persistent  Aggravating factors: sitting   Relieving factors: standing    PRECAUTIONS: None  WEIGHT BEARING RESTRICTIONS No  FALLS:  Has patient fallen in last 6 months? 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, get up on step stools   PLOF: Independent, Independent with basic ADLs, Independent with gait, and Independent with transfers  PATIENT GOALS get rid of pain, fix what's causing the problem    OBJECTIVE:   DIAGNOSTIC FINDINGS:    PATIENT SURVEYS:  FOTO 59.7  SCREENING FOR RED FLAGS: Bowel or bladder incontinence: No Spinal tumors: No Cauda equina syndrome: No Compression fracture: No Abdominal aneurysm: No  COGNITION:  Overall cognitive status: Within functional limits for tasks assessed     SENSATION: WFL  MUSCLE LENGTH:  B hamstrings mild limitation L piriformis OK, R piriformis mild limitation  Mild leg length discrepancy noted    POSTURE: rounded shoulders, forward head, decreased lumbar lordosis, and increased thoracic kyphosis  PALPATION: B glutes/piriformis groups mildly TTP   LUMBAR ROM:   Active  A/PROM  eval  Flexion Mild limitation; RFIS no change   Extension WNL; REIS slight increase in pain  Right lateral flexion Mild limitation  Left lateral flexion Mild limitation   Right rotation   Left rotation    (Blank rows = not tested)  LOWER EXTREMITY ROM:     Active  Right eval Left eval  Hip flexion    Hip extension    Hip abduction    Hip adduction    Hip internal rotation    Hip external rotation    Knee flexion    Knee extension    Ankle dorsiflexion    Ankle plantarflexion    Ankle inversion  Ankle eversion     (Blank  rows = not tested)  LOWER EXTREMITY MMT:    Double leg lower test (+)- unable to maintain posterior pelvic tilt  MMT Right eval Left eval  Hip flexion 4+ 4+  Hip extension 3+ 3+  Hip abduction 3+ 4  Hip adduction    Hip internal rotation    Hip external rotation    Knee flexion 4+ 4+  Knee extension 4+ 4+  Ankle dorsiflexion 5 5  Ankle plantarflexion    Ankle inversion    Ankle eversion     (Blank rows = not tested)  LUMBAR SPECIAL TESTS:  Straight leg raise test: Negative  FUNCTIONAL TESTS:    GAIT: Distance walked: in clinic distances  Assistive device utilized: None Level of assistance: Complete Independence Comments: + trendelenburg B, pronation noted B    TODAY'S TREATMENT   Nustep L3 x6 minutes BLEs only   Bridges with hip ABD red band x5  PPT with 3 second holds x5 Quadruped TA sets 5x3 second holds    PATIENT EDUCATION:  Education details: POC, HEP, exam findings Person educated: Patient Education method: Explanation Education comprehension: verbalized understanding and returned demonstration   HOME EXERCISE PROGRAM:  [id number]  ASSESSMENT:  CLINICAL IMPRESSION: Patient is a 57 y.o. female who was seen today for physical therapy evaluation and treatment for low back pain.    OBJECTIVE IMPAIRMENTS Abnormal gait, decreased mobility, difficulty walking, decreased strength, hypomobility, increased fascial restrictions, impaired flexibility, improper body mechanics, postural dysfunction, and pain.   ACTIVITY LIMITATIONS carrying, lifting, bending, standing, squatting, stairs, transfers, and locomotion level  PARTICIPATION LIMITATIONS: shopping, community activity, occupation, and yard work  PERSONAL FACTORS Age, Behavior pattern, Fitness, Past/current experiences, and Time since onset of injury/illness/exacerbation are also affecting patient's functional outcome.   REHAB POTENTIAL: Good  CLINICAL DECISION MAKING:  Stable/uncomplicated  EVALUATION COMPLEXITY: Low   GOALS: Goals reviewed with patient? Yes  SHORT TERM GOALS: Target date: 10/22/2021  Will be compliant with appropriate progressive HEP  Baseline: Goal status: INITIAL  2.  Pain to be no more than 3/10 at worst during all functional tasks and activities  Baseline:  Goal status: INITIAL  3.  Will have better understanding of biomechanics and ergonomics  Baseline:  Goal status: INITIAL   LONG TERM GOALS: Target date: 11/12/2021  MMT to improve to 5/5 globally  Baseline:  Goal status: INITIAL  2.  Will be able to complete double leg lower test without low back arching to show improved core strength Baseline:  Goal status: INITIAL  3.  Pain to be no more than 1/10 with all functional recreational tasks and activities  Baseline:  Goal status: INITIAL  4.  Will be compliant with appropriate gym based exercise program to maintain functional gains and prevent recurrence of pain  Baseline:  Goal status: INITIAL   PLAN: PT FREQUENCY: 2x/week  PT DURATION: 6 weeks  PLANNED INTERVENTIONS: Therapeutic exercises, Therapeutic activity, Neuromuscular re-education, Balance training, Gait training, Patient/Family education, Self Care, Joint mobilization, Stair training, Orthotic/Fit training, Aquatic Therapy, Dry Needling, Spinal mobilization, Cryotherapy, Moist heat, Taping, Traction, Ultrasound, Ionotophoresis 4mg /ml Dexamethasone, Manual therapy, and Re-evaluation.  PLAN FOR NEXT SESSION: focus on core strength, postural strength and training    Yunus Stoklosa U PT DPT PN2  10/01/2021, 2:37 PM

## 2021-10-03 ENCOUNTER — Ambulatory Visit (HOSPITAL_BASED_OUTPATIENT_CLINIC_OR_DEPARTMENT_OTHER): Payer: 59 | Admitting: Physical Therapy

## 2021-10-03 ENCOUNTER — Encounter (HOSPITAL_BASED_OUTPATIENT_CLINIC_OR_DEPARTMENT_OTHER): Payer: Self-pay | Admitting: Physical Therapy

## 2021-10-03 DIAGNOSIS — M545 Low back pain, unspecified: Secondary | ICD-10-CM | POA: Diagnosis not present

## 2021-10-03 DIAGNOSIS — M6281 Muscle weakness (generalized): Secondary | ICD-10-CM

## 2021-10-03 DIAGNOSIS — R293 Abnormal posture: Secondary | ICD-10-CM

## 2021-10-03 DIAGNOSIS — M5459 Other low back pain: Secondary | ICD-10-CM

## 2021-10-03 NOTE — Therapy (Signed)
OUTPATIENT PHYSICAL THERAPY THORACOLUMBAR TREATMENT   Patient Name: JENET DURIO MRN: [id number] DOB:06-Jan-1966, 56 y.o., female Today's Date: 10/03/2021   PT End of Session - 10/03/21 1228     Visit Number 2    Number of Visits 13    Date for PT Re-Evaluation 11/12/21    Authorization Type [employer]    Authorization Time Period 10/01/21 to 11/12/21    Authorization - Number of Visits 20    PT Start Time 1147    PT Stop Time 1226    PT Time Calculation (min) 39 min    Activity Tolerance Patient tolerated treatment well    Behavior During Therapy [facility] for tasks assessed/performed              Past Medical History:  Diagnosis Date   Anxiety 10/12/2020   Arthritis    RA Dx at age 64   Bell's palsy    Chicken pox    Hypertension    PVC (premature ventricular contraction) 01/02/2017   Rheumatoid arthritis (HCC)    Snoring 01/02/2017   Past Surgical History:  Procedure Laterality Date   BREAST BIOPSY Left    unsure of time  - ?2016 in [place]   CESAREAN SECTION     FOOT SURGERY Left 04/2019   Patient Active Problem List   Diagnosis Date Noted   Anxiety 10/12/2020   PVC (premature ventricular contraction) 01/02/2017   Snoring 01/02/2017   High risk medication use 10/24/2016   Calcaneal spur of both feet 10/24/2016   Rheumatoid arthritis (HCC) 08/07/2016   Hypertension, essential 08/07/2016    PCP: [place], Betty G, MD   REFERRING PROVIDER: [place], Betty G, MD   REFERRING DIAG: M54.50 (ICD-10-CM) - Bilateral low back pain without sciatica, unspecified chronicity   Rationale for Evaluation and Treatment Rehabilitation  THERAPY DIAG:  Other low back pain  Muscle weakness (generalized)  Abnormal posture  ONSET DATE: 09/14/2021   SUBJECTIVE:                                                                                                                                                                                           SUBJECTIVE STATEMENT:  I'm feeling a  little achey but I like the exercises; would also like to know which exercises and machines I can safely do at the gym  PERTINENT HISTORY:  Ms. Diebold is a 56 year old female with history of GERD, hypertension, RA, and PVCs complaining of lower back pain as described above. She has had the pain intermittently for a while but this time seems to be more persistent. Pain is intermittently, not radiated, middle of lower  back and left-sided, achy pain, 7-8/10. Exacerbated by sitting and alleviated by walking and lying down.   Negative for recent injury or unusual level of activity.   No associated LE numbness, tingling, urinary incontinence or retention, stool incontinence, or saddle anesthesia. Negative for fever, abnormal weight loss, unusual fatigue, abdominal pain, nausea, vomiting, changes in bowel habits, urinary symptoms, rash or edema on area.    OTC medications: She has tried Aleve, which helps. Lumbar x-ray done in 08/2019 at her rheumatologist'soffice: Lumbar scoliosis noted, multilevel spondylosis and facet joint arthropathy.   Hypertension on amlodipine 5 mg daily.. BP is at her rheumatologist office has been in normal range, 120s/80s. Negative for CP, dyspnea, edema, focal neurologic deficit.  PAIN:  Are you having pain? Yes: NPRS scale: 3/10 Pain location: just on the left side today Pain description: ache Aggravating factors: sitting  Relieving factors: standing    PRECAUTIONS: None  WEIGHT BEARING RESTRICTIONS No  FALLS:  Has patient fallen in last 6 months? 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, get up on step stools   PLOF:  Independent, Independent with basic ADLs, Independent with gait, and Independent with transfers  PATIENT GOALS get rid of pain, fix what's causing the problem    OBJECTIVE:   DIAGNOSTIC FINDINGS:    PATIENT SURVEYS:  FOTO 59.7  SCREENING FOR RED FLAGS: Bowel or bladder incontinence: No Spinal tumors: No Cauda equina syndrome: No Compression fracture: No Abdominal aneurysm: No  COGNITION:  Overall cognitive status: Within functional limits for tasks assessed     SENSATION: WFL  MUSCLE LENGTH:  B hamstrings mild limitation L piriformis OK, R piriformis mild limitation  Mild leg length discrepancy noted    POSTURE: rounded shoulders, forward head, decreased lumbar lordosis, and increased thoracic kyphosis  PALPATION: B glutes/piriformis groups mildly TTP   LUMBAR ROM:   Active  A/PROM  eval  Flexion Mild limitation; RFIS no change   Extension WNL; REIS slight increase in pain  Right lateral flexion Mild limitation  Left lateral flexion Mild limitation   Right rotation   Left rotation    (Blank rows = not tested)  LOWER EXTREMITY ROM:     Active  Right eval Left eval  Hip flexion    Hip extension    Hip abduction    Hip adduction    Hip internal rotation    Hip external rotation    Knee flexion    Knee extension    Ankle dorsiflexion    Ankle plantarflexion    Ankle inversion    Ankle eversion     (Blank rows = not tested)  LOWER EXTREMITY MMT:    Double leg lower test (+)- unable to maintain posterior pelvic tilt  MMT Right eval Left eval  Hip flexion 4+ 4+  Hip extension 3+ 3+  Hip abduction 3+ 4  Hip adduction    Hip internal rotation    Hip external rotation    Knee flexion 4+ 4+  Knee extension 4+ 4+  Ankle dorsiflexion 5 5  Ankle plantarflexion    Ankle inversion    Ankle eversion     (Blank rows =  not tested)  LUMBAR SPECIAL TESTS:  Straight leg raise test: Negative  FUNCTIONAL TESTS:    GAIT: Distance walked: in  clinic distances  Assistive device utilized: None Level of assistance: Complete Independence Comments: + trendelenburg B, pronation noted B    TODAY'S TREATMENT   10/03/21  Nustep L4 BLEs only x6 minutes   Bridges with red TB and clamshell x10  Sidelying clams red TB 1x15 B Prone hip extensions 1x10 B red TB Walking bridges 1x10   DKTC 3x10 second holds Lumbar rotations stretches 3x10 seconds B   PPT with 3 second hold 1x15  PPT with march 1x10  PPT with double toe tap x10 PPT with dead bug x10 B  TrA sets in quadruped 1x15 3 second holds  TrA sets in quadruped with alternating UE raises 1x10 B  Eccentric mini TA sets sitting 1x10  Modified mini-boat holds 5x5 seconds seated at edge of table      Eval  Nustep L3 x6 minutes BLEs only   Bridges with hip ABD red band x5  PPT with 3 second holds x5 Quadruped TA sets 5x3 second holds    PATIENT EDUCATION:  Education details: POC, HEP, exam findings Person educated: Patient Education method: Explanation Education comprehension: verbalized understanding and returned demonstration   HOME EXERCISE PROGRAM:  [id number]  ASSESSMENT:  CLINICAL IMPRESSION:  Conya arrives today feeling a little better- pain already a little better and mostly on the left side now. Education provided on which machines/exercises are appropriate for her to use, also recommendation to avoid back extension machine and TrA machine with focus on core exercises from PT instead. Progressed hip and core strengthening as able and tolerated today, will continue efforts.    OBJECTIVE IMPAIRMENTS Abnormal gait, decreased mobility, difficulty walking, decreased strength, hypomobility, increased fascial restrictions, impaired flexibility, improper body mechanics, postural dysfunction, and pain.   ACTIVITY LIMITATIONS carrying, lifting, bending, standing, squatting, stairs, transfers, and locomotion level  PARTICIPATION LIMITATIONS: shopping, community  activity, occupation, and yard work  PERSONAL FACTORS Age, Behavior pattern, Fitness, Past/current experiences, and Time since onset of injury/illness/exacerbation are also affecting patient's functional outcome.   REHAB POTENTIAL: Good  CLINICAL DECISION MAKING: Stable/uncomplicated  EVALUATION COMPLEXITY: Low   GOALS: Goals reviewed with patient? Yes  SHORT TERM GOALS: Target date: 10/22/2021  Will be compliant with appropriate progressive HEP  Baseline: Goal status: INITIAL  2.  Pain to be no more than 3/10 at worst during all functional tasks and activities  Baseline:  Goal status: INITIAL  3.  Will have better understanding of biomechanics and ergonomics  Baseline:  Goal status: INITIAL   LONG TERM GOALS: Target date: 11/12/2021  MMT to improve to 5/5 globally  Baseline:  Goal status: INITIAL  2.  Will be able to complete double leg lower test without low back arching to show improved core strength Baseline:  Goal status: INITIAL  3.  Pain to be no more than 1/10 with all functional recreational tasks and activities  Baseline:  Goal status: INITIAL  4.  Will be compliant with appropriate gym based exercise program to maintain functional gains and prevent recurrence of pain  Baseline:  Goal status: INITIAL   PLAN: PT FREQUENCY: 2x/week  PT DURATION: 6 weeks  PLANNED INTERVENTIONS: Therapeutic exercises, Therapeutic activity, Neuromuscular re-education, Balance training, Gait training, Patient/Family education, Self Care, Joint mobilization, Stair training, Orthotic/Fit training, Aquatic Therapy, Dry Needling, Spinal mobilization, Cryotherapy, Moist heat, Taping, Traction, Ultrasound, Ionotophoresis 4mg /ml Dexamethasone, Manual therapy, and Re-evaluation.  PLAN  FOR NEXT SESSION: focus on core strength, postural strength and training    Jamariah Tony U PT DPT PN2  10/03/2021, 12:29 PM

## 2021-10-05 NOTE — Progress Notes (Unsigned)
Office Visit Note  Patient: Vickie [name]             Date of Birth: [birth year]           MRN: [id number]             PCP: [place], Betty G, MD Referring: [place], Betty G, MD Visit Date: 10/18/2021 Occupation: '@GUAROCC' @  Subjective:  Low back pain  History of Present Illness: Vickie [name] is a 56 y.o. female with history of seropositive rheumatoid arthritis.  Patient is currently taking Rinvoq 15 mg 1 tablet by mouth daily.  She is tolerating rinvoq without any side effects and has not missed any doses.  She denies any signs or symptoms of a rheumatoid arthritis flare.  She is not experiencing any joint swelling at this time.  Patient reports that she has been experiencing increased discomfort in her lower back for the past 1 month.  She has not had any injury or fall prior to the onset of symptoms.  She had a virtual visit with her PCP and was referred to physical therapy.  She has completed 5 sessions of physical therapy but has not noticed any improvement in her symptoms.  She states most of her discomfort is midline but she occasionally has some bandlike discomfort as well as some tenderness over the left trochanteric bursa.  She denies any symptoms of radiculopathy down her lower extremities or muscle weakness. She denies any recent or recurrent infections.  She denies any new medical conditions.  She is planning on getting the annual flu shot this fall.    Activities of Daily Living:  Patient reports morning stiffness for 0 minutes.   Patient Denies nocturnal pain.  Difficulty dressing/grooming: Denies Difficulty climbing stairs: Reports Difficulty getting out of chair: Reports Difficulty using hands for taps, buttons, cutlery, and/or writing: Denies  Review of Systems  Constitutional:  Positive for fatigue.  HENT:  Negative for mouth sores and mouth dryness.   Eyes:  Negative for dryness.  Respiratory:  Negative for shortness of breath.   Cardiovascular:  Negative for chest  pain and palpitations.  Gastrointestinal:  Negative for blood in stool, constipation and diarrhea.  Endocrine: Negative for increased urination.  Genitourinary:  Negative for involuntary urination.  Musculoskeletal:  Negative for joint pain, gait problem, joint pain, joint swelling, myalgias, muscle weakness, morning stiffness, muscle tenderness and myalgias.  Skin:  Negative for color change, rash, hair loss and sensitivity to sunlight.  Allergic/Immunologic: Negative for susceptible to infections.  Neurological:  Negative for dizziness and headaches.  Hematological:  Negative for swollen glands.  Psychiatric/Behavioral:  Positive for sleep disturbance. Negative for depressed mood. The patient is not nervous/anxious.     PMFS History:  Patient Active Problem List   Diagnosis Date Noted   Anxiety 10/12/2020   PVC (premature ventricular contraction) 01/02/2017   Snoring 01/02/2017   High risk medication use 10/24/2016   Calcaneal spur of both feet 10/24/2016   Rheumatoid arthritis ([place]) 08/07/2016   Hypertension, essential 08/07/2016    Past Medical History:  Diagnosis Date   Anxiety 10/12/2020   Arthritis    RA Dx at age 38   Bell's palsy    Chicken pox    Hypertension    PVC (premature ventricular contraction) 01/02/2017   Rheumatoid arthritis ([place])    Snoring 01/02/2017    Family History  Problem Relation Age of Onset   Rheum arthritis Mother    Diabetes Neg Hx    Hypertension  Neg Hx    Hyperlipidemia Neg Hx    Stroke Neg Hx    Colon cancer Neg Hx    Past Surgical History:  Procedure Laterality Date   BREAST BIOPSY Left    unsure of time  - ?2016 in [place] Left 04/2019   Social History   Social History Narrative   Not on file   Immunization History  Administered Date(s) Administered   Influenza Inj Mdck Quad Pf 11/28/2016   Influenza Split 12/17/2019   Influenza,inj,Quad PF,6+ Mos 12/05/2015, 12/19/2017, 11/12/2018    Influenza-Unspecified 12/06/2020   PFIZER Comirnaty(Gray Top)Covid-19 Tri-Sucrose Vaccine 05/31/2020   PFIZER(Purple Top)SARS-COV-2 Vaccination 05/10/2019, 06/07/2019, 11/12/2019   Tdap 09/26/2014   Zoster Recombinat (Shingrix) 11/12/2018, 03/25/2019     Objective: Vital Signs: BP 110/78 (BP Location: Left Arm, Patient Position: Sitting, Cuff Size: Normal)   Pulse 79   Resp 15   Ht '5\' 1"'  (1.549 m)   Wt 136 lb (61.7 kg)   BMI 25.70 kg/m    Physical Exam Vitals and nursing note reviewed.  Constitutional:      Appearance: She is well-developed.  HENT:     Head: Normocephalic and atraumatic.  Eyes:     Conjunctiva/sclera: Conjunctivae normal.  Cardiovascular:     Rate and Rhythm: Normal rate and regular rhythm.     Heart sounds: Normal heart sounds.  Pulmonary:     Effort: Pulmonary effort is normal.     Breath sounds: Normal breath sounds.  Abdominal:     General: Bowel sounds are normal.     Palpations: Abdomen is soft.  Musculoskeletal:     Cervical back: Normal range of motion.  Skin:    General: Skin is warm and dry.     Capillary Refill: Capillary refill takes less than 2 seconds.  Neurological:     Mental Status: She is alert and oriented to person, place, and time.  Psychiatric:        Behavior: Behavior normal.      Musculoskeletal Exam: C-spine has good range of motion with no discomfort.  Painful range of motion of the lumbar spine.  Midline spinal tenderness in the lumbar region noted.  Mild SI joint tenderness especially on the left side noted.  Tenderness over the left trochanteric bursa.  Shoulder joints and elbow joints have good range of motion with no discomfort.  No rheumatoid nodules palpable on extensor surface of her elbows at this time.  Limited range of motion of both wrist joints with synovial thickening but no tenderness or synovitis.  Ulnar deviation and synovial thickening of all MCP joints noted.  Hip joints have good range of motion with no groin  pain.  Knee joints have good range of motion with no warmth or effusion.  Ankle joints have good range of motion with no tenderness or joint swelling.  Postsurgical changes noted in her feet but no tenderness or synovitis over MTP joints.  CDAI Exam: CDAI Score: 0.2  Patient Global: 1 mm; Provider Global: 1 mm Swollen: 0 ; Tender: 2  Joint Exam 10/18/2021      Right  Left  Lumbar Spine   Tender     Sacroiliac      Tender     Investigation: No additional findings.  Imaging: No results found.  Recent Labs: Lab Results  Component Value Date   WBC 3.1 (L) 09/20/2021   HGB 13.5 09/20/2021   PLT 370 09/20/2021  NA 141 09/20/2021   K 4.3 09/20/2021   CL 106 09/20/2021   CO2 27 09/20/2021   GLUCOSE 90 09/20/2021   BUN 17 09/20/2021   CREATININE 0.66 09/20/2021   BILITOT 0.9 09/20/2021   ALKPHOS 79 01/02/2017   AST 24 09/20/2021   ALT 15 09/20/2021   PROT 7.3 09/20/2021   ALBUMIN 4.3 01/02/2017   CALCIUM 9.9 09/20/2021   GFRAA 116 08/25/2020   QFTBGOLDPLUS NEGATIVE 05/18/2021    Speciality Comments: Rinvoq September 2021, inadequate response to methotrexate and Humira  Procedures:  No procedures performed Allergies: Latex and Sulfa antibiotics   Assessment / Plan:     Visit Diagnoses: Rheumatoid arthritis involving multiple sites with positive rheumatoid factor (HCC) - Positive RF, positive anti-CCP, elevated ESR, erosive disease with nodulosis: She has no joint tenderness or synovitis on examination today.  She has not had any signs or symptoms of a rheumatoid arthritis flare.  She has clinically been doing well taking Rinvoq 15 mg 1 tablet by mouth daily.  She is tolerating Rinvoq without any side effects and has not missed any doses recently.  She has not had any recent or recurrent infections. She is not experiencing any morning stiffness or nocturnal pain.  No difficulty with ADLs.  She will remain on Rinvoq as monotherapy.  She was advised to notify us if she  develops increased joint pain or joint swelling.  She will follow-up in the office in 5 months or sooner if needed.  High risk medication use - Rinvoq 15 mg 1 tablet by mouth daily. D/c Rasuvo due to clinically doing well. - Plan: Lipid panel, CBC with Differential/Platelet CBC and CMP were drawn on 09/20/2021.  CBC with differential will be updated today.  Her next lab work will be due in October and every 3 months to monitor for drug toxicity.  Standing orders for CBC and CMP remain in place.  TB Gold negative on 05/18/2021. She has not had any recent or recurrent infections.  Discussed the importance of holding Rinvoq if she develops signs or symptoms of an infection and to resume once infection has completely cleared. Lipid panel will be checked today since the patient is fasting. Counseled on the increase risk of venous thrombosis. Counseled about FDA black box warning of MACE (major adverse CV events including cardiovascular death, myocardial infarction, and stroke).  Elevated cholesterol -lipid panel was updated on 03/21/2021: Total cholesterol was 250, triglycerides 188, and LDL 144, HDL 74.  She is currently fasting.  A lipid panel will be drawn today.  Plan: Lipid panel  Rheumatoid nodule, right elbow ([place]) - Resolved.   Rheumatoid nodule, left elbow (HCC) - Resolved.   S/P bunionectomy - She had a bunionectomy by Dr. Doran Durand in 2021.  Doing well.  Calcaneal spur of both feet: She is not experiencing discomfort in her feet at this time.  Chronic midline low back pain without sciatica - Patient presents today with increased discomfort in her lower back for the past 1 month.  She had no injury or fall prior to the onset of symptoms.  She had a virtual visit with her PCP and was referred to physical therapy.  She has completed 5 sessions of physical therapy and has noticed very little improvement in her discomfort.  Most of her discomfort has been midline but at times she has bandlike  discomfort and some radiating pain to her left hip.  On examination she has painful range of motion with midline spinal tenderness in  the lumbar region and mild tenderness over the left SI joint and left trochanteric bursa.  She is not experiencing any symptoms of sciatica at this time.  X-rays of the lumbar spine and pelvis were obtained on 09/08/2019 and results were reviewed with the patient today in the office.  MRI of the lumbar spine was also updated on 09/14/2019 and results were reviewed today.  Patient requested to have updated x-rays today for further evaluation.  Patient was strongly encouraged to continue to go to physical therapy.  She will notify us if she develops any new or worsening symptoms and we can refer her to a spine specialist. Plan: XR Pelvis 1-2 Views, XR Lumbar Spine 2-3 Views  Other medical conditions are listed as follows:   History of hypertension: BP was 110/78 today in the office.   Anxiety    Orders: Orders Placed This Encounter  Procedures   XR Pelvis 1-2 Views   XR Lumbar Spine 2-3 Views   Lipid panel   CBC with Differential/Platelet   No orders of the defined types were placed in this encounter.    Follow-Up Instructions: Return in about 5 months (around 03/20/2022) for Rheumatoid arthritis.   Ofilia Neas, PA-C  Note - This record has been created using Dragon software.  Chart creation errors have been sought, but may not always  have been located. Such creation errors do not reflect on  the standard of medical care.

## 2021-10-06 ENCOUNTER — Other Ambulatory Visit: Payer: Self-pay | Admitting: Physician Assistant

## 2021-10-06 DIAGNOSIS — M0579 Rheumatoid arthritis with rheumatoid factor of multiple sites without organ or systems involvement: Secondary | ICD-10-CM

## 2021-10-08 NOTE — Telephone Encounter (Signed)
Next Visit: 10/18/2021   Last Visit: 05/18/2021   Last Fill: 09/13/2021   DX: Rheumatoid arthritis involving multiple sites with positive rheumatoid factor    Current Dose per office note 05/18/2021: Rinvoq 15 mg 1 tablet by mouth daily.    Labs: 09/20/2021 White cell count is low most likely due to the use of Rinvoq .  CMP is normal.  We will continue to monitor labs.   TB Gold: 05/18/2021, TB Gold is negative.   Okay to refill Rinvoq?

## 2021-10-10 ENCOUNTER — Ambulatory Visit (HOSPITAL_BASED_OUTPATIENT_CLINIC_OR_DEPARTMENT_OTHER): Payer: 59 | Admitting: Physical Therapy

## 2021-10-10 ENCOUNTER — Encounter (HOSPITAL_BASED_OUTPATIENT_CLINIC_OR_DEPARTMENT_OTHER): Payer: Self-pay | Admitting: Physical Therapy

## 2021-10-10 DIAGNOSIS — M545 Low back pain, unspecified: Secondary | ICD-10-CM | POA: Diagnosis not present

## 2021-10-10 DIAGNOSIS — R293 Abnormal posture: Secondary | ICD-10-CM

## 2021-10-10 DIAGNOSIS — M5459 Other low back pain: Secondary | ICD-10-CM

## 2021-10-10 DIAGNOSIS — M6281 Muscle weakness (generalized): Secondary | ICD-10-CM

## 2021-10-10 NOTE — Therapy (Signed)
OUTPATIENT PHYSICAL THERAPY THORACOLUMBAR TREATMENT   Patient Name: Vickie [name] MRN: [id number] DOB:March 22, 1965, 56 y.o., female Today's Date: 10/10/2021   PT End of Session - 10/10/21 1107     Visit Number 3    Number of Visits 13    Date for PT Re-Evaluation 11/12/21    Authorization Type [employer]    Authorization Time Period 10/01/21 to 11/12/21    Authorization - Number of Visits 20    PT Start Time 1101    PT Stop Time 1141    PT Time Calculation (min) 40 min    Activity Tolerance Patient tolerated treatment well    Behavior During Therapy [facility] for tasks assessed/performed               Past Medical History:  Diagnosis Date   Anxiety 10/12/2020   Arthritis    RA Dx at age 77   Bell's palsy    Chicken pox    Hypertension    PVC (premature ventricular contraction) 01/02/2017   Rheumatoid arthritis (HCC)    Snoring 01/02/2017   Past Surgical History:  Procedure Laterality Date   BREAST BIOPSY Left    unsure of time  - ?2016 in [place]   CESAREAN SECTION     FOOT SURGERY Left 04/2019   Patient Active Problem List   Diagnosis Date Noted   Anxiety 10/12/2020   PVC (premature ventricular contraction) 01/02/2017   Snoring 01/02/2017   High risk medication use 10/24/2016   Calcaneal spur of both feet 10/24/2016   Rheumatoid arthritis (HCC) 08/07/2016   Hypertension, essential 08/07/2016    PCP: [place], Betty G, MD   REFERRING PROVIDER: [place], Betty G, MD   REFERRING DIAG: M54.50 (ICD-10-CM) - Bilateral low back pain without sciatica, unspecified chronicity   Rationale for Evaluation and Treatment Rehabilitation  THERAPY DIAG:  Other low back pain  Muscle weakness (generalized)  Abnormal posture  ONSET DATE: 09/14/2021   SUBJECTIVE:                                                                                                                                                                                           SUBJECTIVE STATEMENT:  I actually  felt really good after last visit, weekend went ok but I went back to work yesterday and I was really hurting afterwards   PERTINENT HISTORY:  Vickie [name] is a 56 year old female with history of GERD, hypertension, RA, and PVCs complaining of lower back pain as described above. She has had the pain intermittently for a while but this time seems to be more persistent. Pain is intermittently, not radiated, middle of lower  back and left-sided, achy pain, 7-8/10. Exacerbated by sitting and alleviated by walking and lying down.   Negative for recent injury or unusual level of activity.   No associated LE numbness, tingling, urinary incontinence or retention, stool incontinence, or saddle anesthesia. Negative for fever, abnormal weight loss, unusual fatigue, abdominal pain, nausea, vomiting, changes in bowel habits, urinary symptoms, rash or edema on area.    OTC medications: She has tried Aleve, which helps. Lumbar x-ray done in 08/2019 at her rheumatologist'soffice: Lumbar scoliosis noted, multilevel spondylosis and facet joint arthropathy.   Hypertension on amlodipine 5 mg daily.. BP is at her rheumatologist office has been in normal range, 120s/80s. Negative for CP, dyspnea, edema, focal neurologic deficit.  PAIN:  Are you having pain? Yes: NPRS scale: 2/10 Pain location: just on the left side today Pain description: ache Aggravating factors: varies  Relieving factors: varies    PRECAUTIONS: None  WEIGHT BEARING RESTRICTIONS No  FALLS:  Has patient fallen in last 6 months? 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, get up on step stools   PLOF:  Independent, Independent with basic ADLs, Independent with gait, and Independent with transfers  PATIENT GOALS get rid of pain, fix what's causing the problem    OBJECTIVE:   DIAGNOSTIC FINDINGS:    PATIENT SURVEYS:  FOTO 59.7  SCREENING FOR RED FLAGS: Bowel or bladder incontinence: No Spinal tumors: No Cauda equina syndrome: No Compression fracture: No Abdominal aneurysm: No  COGNITION:  Overall cognitive status: Within functional limits for tasks assessed     SENSATION: WFL  MUSCLE LENGTH:  B hamstrings mild limitation L piriformis OK, R piriformis mild limitation  Mild leg length discrepancy noted    POSTURE: rounded shoulders, forward head, decreased lumbar lordosis, and increased thoracic kyphosis  PALPATION: B glutes/piriformis groups mildly TTP   LUMBAR ROM:   Active  A/PROM  eval  Flexion Mild limitation; RFIS no change   Extension WNL; REIS slight increase in pain  Right lateral flexion Mild limitation  Left lateral flexion Mild limitation   Right rotation   Left rotation    (Blank rows = not tested)  LOWER EXTREMITY ROM:     Active  Right eval Left eval  Hip flexion    Hip extension    Hip abduction    Hip adduction    Hip internal rotation    Hip external rotation    Knee flexion    Knee extension    Ankle dorsiflexion    Ankle plantarflexion    Ankle inversion    Ankle eversion     (Blank rows = not tested)  LOWER EXTREMITY MMT:    Double leg lower test (+)- unable to maintain posterior pelvic tilt  MMT Right eval Left eval  Hip flexion 4+ 4+  Hip extension 3+ 3+  Hip abduction 3+ 4  Hip adduction    Hip internal rotation    Hip external rotation    Knee flexion 4+ 4+  Knee extension 4+ 4+  Ankle dorsiflexion 5 5  Ankle plantarflexion    Ankle inversion    Ankle eversion     (Blank rows =  not tested)  LUMBAR SPECIAL TESTS:  Straight leg raise test: Negative  FUNCTIONAL TESTS:    GAIT: Distance walked: in  clinic distances  Assistive device utilized: None Level of assistance: Complete Independence Comments: + trendelenburg B, pronation noted B    TODAY'S TREATMENT   8/16  Nustep L5 x6 minutes BLEs only  Walking bridges 1x15 Sidelying clams green TB 1x10 green TB  Prone hip extensions green TB 1x10   PPT with march 1x10 B (added to HEP) PPT with double bent leg lower 1x10 (added to HEP) PPT with bicycle 1x10 B (added to HEP)  TrA 1x15 3 second holds TrA quadruped with cones on back, cues for TrA activation and not excessively rounding or arching back  Eccentric mini TA sets sitting 1x10  Modified mini-boat holds 5x10 seconds seated at edge of table               Biomechanics: - lifting 5# KB from 8 inch high surface x10  - progressed to 5# KB from 4 inch high surface x10    10/03/21  Nustep L4 BLEs only x6 minutes   Bridges with red TB and clamshell x10  Sidelying clams red TB 1x15 B Prone hip extensions 1x10 B red TB Walking bridges 1x10   DKTC 3x10 second holds Lumbar rotations stretches 3x10 seconds B   PPT with 3 second hold 1x15  PPT with march 1x10  PPT with double toe tap x10 PPT with dead bug x10 B  TrA sets in quadruped 1x15 3 second holds  TrA sets in quadruped with alternating UE raises 1x10 B  Eccentric mini TA sets sitting 1x10  Modified mini-boat holds 5x5 seconds seated at edge of table      Eval  Nustep L3 x6 minutes BLEs only   Bridges with hip ABD red band x5  PPT with 3 second holds x5 Quadruped TA sets 5x3 second holds    PATIENT EDUCATION:  Education details: POC, HEP, exam findings Person educated: Patient Education method: Explanation Education comprehension: verbalized understanding and returned demonstration   HOME EXERCISE PROGRAM:  [id number]  ASSESSMENT:  CLINICAL IMPRESSION:  Vickie [name] arrives today doing OK, felt good after last session then was off work for a few days before returning yesterday and had increased  pain. Took a look at her lifting mechanics, which are very poor and we introduced basics of good lifting form/biomechanics today as well. Progressed all core activities as able and tolerated otherwise. I do think that repetitive faulty mechanics at work may definitely be contributing to her pain.   OBJECTIVE IMPAIRMENTS Abnormal gait, decreased mobility, difficulty walking, decreased strength, hypomobility, increased fascial restrictions, impaired flexibility, improper body mechanics, postural dysfunction, and pain.   ACTIVITY LIMITATIONS carrying, lifting, bending, standing, squatting, stairs, transfers, and locomotion level  PARTICIPATION LIMITATIONS: shopping, community activity, occupation, and yard work  PERSONAL FACTORS Age, Behavior pattern, Fitness, Past/current experiences, and Time since onset of injury/illness/exacerbation are also affecting patient's functional outcome.   REHAB POTENTIAL: Good  CLINICAL DECISION MAKING: Stable/uncomplicated  EVALUATION COMPLEXITY: Low   GOALS: Goals reviewed with patient? Yes  SHORT TERM GOALS: Target date: 10/22/2021  Will be compliant with appropriate progressive HEP  Baseline: Goal status: INITIAL  2.  Pain to be no more than 3/10 at worst during all functional tasks and activities  Baseline:  Goal status: INITIAL  3.  Will have better understanding of biomechanics and ergonomics  Baseline:  Goal status: INITIAL   LONG TERM GOALS: Target date:  11/12/2021  MMT to improve to 5/5 globally  Baseline:  Goal status: INITIAL  2.  Will be able to complete double leg lower test without low back arching to show improved core strength Baseline:  Goal status: INITIAL  3.  Pain to be no more than 1/10 with all functional recreational tasks and activities  Baseline:  Goal status: INITIAL  4.  Will be compliant with appropriate gym based exercise program to maintain functional gains and prevent recurrence of pain  Baseline:  Goal  status: INITIAL   PLAN: PT FREQUENCY: 2x/week  PT DURATION: 6 weeks  PLANNED INTERVENTIONS: Therapeutic exercises, Therapeutic activity, Neuromuscular re-education, Balance training, Gait training, Patient/Family education, Self Care, Joint mobilization, Stair training, Orthotic/Fit training, Aquatic Therapy, Dry Needling, Spinal mobilization, Cryotherapy, Moist heat, Taping, Traction, Ultrasound, Ionotophoresis 4mg /ml Dexamethasone, Manual therapy, and Re-evaluation.  PLAN FOR NEXT SESSION: focus on core strength, postural strength and training; keep progressing biomechanics training    Juana Haralson U PT DPT PN2  10/10/2021, 11:42 AM

## 2021-10-12 ENCOUNTER — Ambulatory Visit (HOSPITAL_BASED_OUTPATIENT_CLINIC_OR_DEPARTMENT_OTHER): Payer: 59 | Admitting: Physical Therapy

## 2021-10-12 ENCOUNTER — Encounter (HOSPITAL_BASED_OUTPATIENT_CLINIC_OR_DEPARTMENT_OTHER): Payer: Self-pay | Admitting: Physical Therapy

## 2021-10-12 DIAGNOSIS — M6281 Muscle weakness (generalized): Secondary | ICD-10-CM

## 2021-10-12 DIAGNOSIS — R293 Abnormal posture: Secondary | ICD-10-CM

## 2021-10-12 DIAGNOSIS — M5459 Other low back pain: Secondary | ICD-10-CM

## 2021-10-12 DIAGNOSIS — M545 Low back pain, unspecified: Secondary | ICD-10-CM | POA: Diagnosis not present

## 2021-10-12 NOTE — Therapy (Signed)
OUTPATIENT PHYSICAL THERAPY THORACOLUMBAR TREATMENT   Patient Name: Vickie [name] MRN: [id number] DOB:01-07-1966, 56 y.o., female Today's Date: 10/12/2021   PT End of Session - 10/12/21 0851     Visit Number 4    Number of Visits 13    Date for PT Re-Evaluation 11/12/21    Authorization Type U[employer]    Authorization Time Period 10/01/21 to 11/12/21    Authorization - Number of Visits 20    PT Start Time 0804    PT Stop Time 0844    PT Time Calculation (min) 40 min    Activity Tolerance Patient tolerated treatment well    Behavior During Therapy [facility] for tasks assessed/performed                Past Medical History:  Diagnosis Date   Anxiety 10/12/2020   Arthritis    RA Dx at age 77   Bell's palsy    Chicken pox    Hypertension    PVC (premature ventricular contraction) 01/02/2017   Rheumatoid arthritis (HCC)    Snoring 01/02/2017   Past Surgical History:  Procedure Laterality Date   BREAST BIOPSY Left    unsure of time  - ?2016 in [place]   CESAREAN SECTION     FOOT SURGERY Left 04/2019   Patient Active Problem List   Diagnosis Date Noted   Anxiety 10/12/2020   PVC (premature ventricular contraction) 01/02/2017   Snoring 01/02/2017   High risk medication use 10/24/2016   Calcaneal spur of both feet 10/24/2016   Rheumatoid arthritis (HCC) 08/07/2016   Hypertension, essential 08/07/2016    PCP: [place], Betty G, MD   REFERRING PROVIDER: [place], Betty G, MD   REFERRING DIAG: M54.50 (ICD-10-CM) - Bilateral low back pain without sciatica, unspecified chronicity   Rationale for Evaluation and Treatment Rehabilitation  THERAPY DIAG:  Other low back pain  Muscle weakness (generalized)  Abnormal posture  ONSET DATE: 09/14/2021   SUBJECTIVE:                                                                                                                                                                                           SUBJECTIVE STATEMENT:  Pt states  she felt good after prior Rx and reports compliance with HEP.  Pt states she had pain yesterday.  She laid down and did her exercises and felt better.  Pt states she feels better after performing her home exercises.  She reports pain being in a different place today. Pt denies pain in lumbar today but has pain in lateral L hip.  Pt states she is improving.    PERTINENT  HISTORY:  Vickie [name] is a 56 year old female with history of GERD, hypertension, RA, and PVCs complaining of lower back pain as described above. She has had the pain intermittently for a while but this time seems to be more persistent. Pain is intermittently, not radiated, middle of lower back and left-sided, achy pain, 7-8/10. Exacerbated by sitting and alleviated by walking and lying down.   Negative for recent injury or unusual level of activity.   No associated LE numbness, tingling, urinary incontinence or retention, stool incontinence, or saddle anesthesia. Negative for fever, abnormal weight loss, unusual fatigue, abdominal pain, nausea, vomiting, changes in bowel habits, urinary symptoms, rash or edema on area.    OTC medications: She has tried Aleve, which helps. Lumbar x-ray done in 08/2019 at her rheumatologist'soffice: Lumbar scoliosis noted, multilevel spondylosis and facet joint arthropathy.   Hypertension on amlodipine 5 mg daily.. BP is at her rheumatologist office has been in normal range, 120s/80s. Negative for CP, dyspnea, edema, focal neurologic deficit.  PAIN:  Are you having pain? Yes: NPRS scale: 2/10 Pain location: L lateral hip Pain description: ache Aggravating factors: varies  Relieving factors: varies    PRECAUTIONS: None  WEIGHT BEARING RESTRICTIONS No  FALLS:  Has patient fallen in last 6 months? 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, get up on step stools   PLOF: Independent, Independent with basic ADLs, Independent with gait, and Independent with transfers  PATIENT GOALS get rid of pain, fix what's causing the problem    OBJECTIVE:   DIAGNOSTIC FINDINGS:     TODAY'S TREATMENT   Nustep L5 x 5 minutes BLEs only PPT with march 1x15 B  Supine alt UE/LE with PPT 2x10 PPT with double bent leg lower 1x10  PPT with bicycle 1x10 B  Walking bridges 1x15 Supine bridge with clamshells with GTB 2x10 Sidelying clams green TB 2x10 green TB  TrA in quadruped with sec hold Q-ped alt UE raises with TrA x10 reps Q-ped birddogs with TrA with dowel on back for form  Therapeutic Activities:             Biomechanics: -  PT educated pt in correct lifting mechanics including how to properly perform squat to lift and to keep load close - lifting 5# KB from 8 inch high surface - progressed to 5# KB from 4 inch high surface    PATIENT EDUCATION:  Education details: POC, HEP, exercise form, rationale of exercises, and body mechanics with lifting Person educated: Patient Education method: Explanation, demonstration, verbal cues Education comprehension: verbalized understanding and returned demonstration, verbal cues required   HOME EXERCISE PROGRAM:  [id number]  ASSESSMENT:  CLINICAL IMPRESSION: Pt reports improved pain and sx's with HEP.  She performed exercises well with cuing for correct form.  Pt is improving with core strength though does have weakness in core mm.  She was challenged with Q-ped birddogs and required cuing for correct form though was able to perform without dowel falling off of her back.  PT educated pt with correct squat to lift body mechanics and pt demonstrates improved form with cuing and repetition.  Pt responded  well to Rx  reporting improved pain from 2/10 before Rx to 0/10 after Rx.  She should cont to benefit from cont skilled PT services to address ongoing goals and to assist in restoring PLOF.    OBJECTIVE IMPAIRMENTS Abnormal gait, decreased mobility, difficulty walking, decreased strength, hypomobility, increased fascial restrictions, impaired flexibility, improper body mechanics, postural dysfunction, and pain.   ACTIVITY LIMITATIONS carrying, lifting, bending, standing, squatting, stairs, transfers, and locomotion level  PARTICIPATION LIMITATIONS: shopping, community activity, occupation, and yard work  PERSONAL FACTORS Age, Behavior pattern, Fitness, Past/current experiences, and Time since onset of injury/illness/exacerbation are also affecting patient's functional outcome.   REHAB POTENTIAL: Good  CLINICAL DECISION MAKING: Stable/uncomplicated  EVALUATION COMPLEXITY: Low   GOALS: Goals reviewed with patient? Yes  SHORT TERM GOALS: Target date: 10/22/2021  Will be compliant with appropriate progressive HEP  Baseline: Goal status: INITIAL  2.  Pain to be no more than 3/10 at worst during all functional tasks and activities  Baseline:  Goal status: INITIAL  3.  Will have better understanding of biomechanics and ergonomics  Baseline:  Goal status: INITIAL   LONG TERM GOALS: Target date: 11/12/2021  MMT to improve to 5/5 globally  Baseline:  Goal status: INITIAL  2.  Will be able to complete double leg lower test without low back arching to show improved core strength Baseline:  Goal status: INITIAL  3.  Pain to be no more than 1/10 with all functional recreational tasks and activities  Baseline:  Goal status: INITIAL  4.  Will be compliant with appropriate gym based exercise program to maintain functional gains and prevent recurrence of pain  Baseline:  Goal status: INITIAL   PLAN: PT FREQUENCY: 2x/week  PT DURATION: 6 weeks  PLANNED INTERVENTIONS: Therapeutic  exercises, Therapeutic activity, Neuromuscular re-education, Balance training, Gait training, Patient/Family education, Self Care, Joint mobilization, Stair training, Orthotic/Fit training, Aquatic Therapy, Dry Needling, Spinal mobilization, Cryotherapy, Moist heat, Taping, Traction, Ultrasound, Ionotophoresis 4mg /ml Dexamethasone, Manual therapy, and Re-evaluation.  PLAN FOR NEXT SESSION: focus on core strength, postural strength and training; keep progressing biomechanics training    Selinda Michaels III PT, DPT 10/12/21 2:20 PM

## 2021-10-16 ENCOUNTER — Ambulatory Visit (HOSPITAL_BASED_OUTPATIENT_CLINIC_OR_DEPARTMENT_OTHER): Payer: 59 | Admitting: Physical Therapy

## 2021-10-16 DIAGNOSIS — M545 Low back pain, unspecified: Secondary | ICD-10-CM | POA: Diagnosis not present

## 2021-10-16 DIAGNOSIS — M5459 Other low back pain: Secondary | ICD-10-CM

## 2021-10-16 DIAGNOSIS — R293 Abnormal posture: Secondary | ICD-10-CM

## 2021-10-16 DIAGNOSIS — M6281 Muscle weakness (generalized): Secondary | ICD-10-CM

## 2021-10-16 NOTE — Therapy (Signed)
OUTPATIENT PHYSICAL THERAPY THORACOLUMBAR TREATMENT   Patient Name: Vickie [name] MRN: [id number] DOB:[birth year], 56 y.o., female Today's Date: 10/17/2021   PT End of Session - 10/16/21 1527     Visit Number 5    Number of Visits 13    Date for PT Re-Evaluation 11/12/21    Authorization Type UHC    Authorization Time Period 10/01/21 to 11/12/21    PT Start Time 1524    PT Stop Time 1607    PT Time Calculation (min) 43 min    Activity Tolerance Patient tolerated treatment well    Behavior During Therapy [facility] for tasks assessed/performed                Past Medical History:  Diagnosis Date   Anxiety 10/12/2020   Arthritis    RA Dx at age 53   Bell's palsy    Chicken pox    Hypertension    PVC (premature ventricular contraction) 01/02/2017   Rheumatoid arthritis (HCC)    Snoring 01/02/2017   Past Surgical History:  Procedure Laterality Date   BREAST BIOPSY Left    unsure of time  - ?2016 in [place]   CESAREAN SECTION     FOOT SURGERY Left 04/2019   Patient Active Problem List   Diagnosis Date Noted   Anxiety 10/12/2020   PVC (premature ventricular contraction) 01/02/2017   Snoring 01/02/2017   High risk medication use 10/24/2016   Calcaneal spur of both feet 10/24/2016   Rheumatoid arthritis (HCC) 08/07/2016   Hypertension, essential 08/07/2016    PCP: [place], Betty G, MD   REFERRING PROVIDER: [place], Betty G, MD   REFERRING DIAG: M54.50 (ICD-10-CM) - Bilateral low back pain without sciatica, unspecified chronicity   Rationale for Evaluation and Treatment Rehabilitation  THERAPY DIAG:  Other low back pain  Muscle weakness (generalized)  Abnormal posture  ONSET DATE: 09/14/2021   SUBJECTIVE:                                                                                                                                                                                           SUBJECTIVE STATEMENT:  Pt denies any adverse effects after prior Rx.  Pt  reports compliance with HEP.  Pt feels better after performing exercises.  Pt states therapy is helping, though still having pain.  Pt states her pain shifts and changes places.  Pt states she was in pain yesterday for no specific reason.  Pt states she used ice last night and felt really good.  Pt sees her RA MD on Thursday.    PERTINENT HISTORY:  [name] is a 56 year old female  with history of GERD, hypertension, RA, and PVCs complaining of lower back pain as described above. She has had the pain intermittently for a while but this time seems to be more persistent. Pain is intermittently, not radiated, middle of lower back and left-sided, achy pain, 7-8/10. Exacerbated by sitting and alleviated by walking and lying down.   Negative for recent injury or unusual level of activity.   No associated LE numbness, tingling, urinary incontinence or retention, stool incontinence, or saddle anesthesia. Negative for fever, abnormal weight loss, unusual fatigue, abdominal pain, nausea, vomiting, changes in bowel habits, urinary symptoms, rash or edema on area.    OTC medications: She has tried Aleve, which helps. Lumbar x-ray done in 08/2019 at her rheumatologist'soffice: Lumbar scoliosis noted, multilevel spondylosis and facet joint arthropathy.   Hypertension on amlodipine 5 mg daily.. BP is at her rheumatologist office has been in normal range, 120s/80s. Negative for CP, dyspnea, edema, focal neurologic deficit.  PAIN:  Are you having pain? Yes: NPRS scale: 2/10 Pain location: L sided lower lumbar and L lateral hip Pain description: ache Aggravating factors: varies  Relieving factors: varies    PRECAUTIONS: None  WEIGHT BEARING RESTRICTIONS No  FALLS:  Has patient fallen in last 6 months? 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, get up on step stools   PLOF: Independent, Independent with basic ADLs, Independent with gait, and Independent with transfers  PATIENT GOALS get rid of pain, fix what's causing the problem    OBJECTIVE:   DIAGNOSTIC FINDINGS:     TODAY'S TREATMENT   Therapeutic Exercise: Nustep L5 x 5 minutes BLEs only Supine alt UE/LE with PPT 2x10 PPT with double bent leg lower 2x10  Supine Alt UE ext with contralateral SLR x10 reps   Walking bridges 1x15 Sidelying clams green TB 2x10 green TB  Q-ped alt UE raises with TrA x10 reps Q-ped alt LE raises with TrA x10 reps Q-ped birddogs with TrA with dowel on back for form Supine dead bug with LE not supported x 10 reps  Paloff Press with TrA with RTB x10 each  Manual Therapy:  STM with trigger point release to L glute in R S/L'ing with pillow b/w knees to improve pain and soft tissue tightness and reduce myofascial restrictions and adhesions.    PATIENT EDUCATION:  Education details: POC, HEP, exercise form, and rationale of exercises Person educated: Patient Education method: Explanation, demonstration, verbal cues Education comprehension: verbalized understanding and returned demonstration, verbal cues required   HOME EXERCISE PROGRAM:  [id number]  ASSESSMENT:  CLINICAL IMPRESSION: Pt reports improved pain and sx's with HEP.  She performed exercises well with cuing for correct form and is improving with core strength.  She was challenged with Q-ped birddogs and had difficulty balancing dowel on her back.  PT progressed core exercises today and pt tolerated progression well.  Pt has some tenderness with palpation and STW to L glute.  She responded well to Rx reporting improved pain from 2/10 before Rx to 0/10 after Rx.  She should cont to benefit from  cont skilled PT services to address ongoing goals and to assist in restoring PLOF.  OBJECTIVE IMPAIRMENTS Abnormal gait, decreased mobility, difficulty walking, decreased strength, hypomobility, increased fascial restrictions, impaired flexibility, improper body mechanics, postural dysfunction, and pain.   ACTIVITY LIMITATIONS carrying, lifting, bending, standing, squatting, stairs, transfers, and locomotion level  PARTICIPATION LIMITATIONS: shopping, community activity, occupation, and yard work  PERSONAL FACTORS Age, Behavior pattern, Fitness, Past/current experiences, and Time since onset of injury/illness/exacerbation are also affecting patient's functional outcome.   REHAB POTENTIAL: Good  CLINICAL DECISION MAKING: Stable/uncomplicated  EVALUATION COMPLEXITY: Low   GOALS: Goals reviewed with patient? Yes  SHORT TERM GOALS: Target date: 10/22/2021  Will be compliant with appropriate progressive HEP  Baseline: Goal status: INITIAL  2.  Pain to be no more than 3/10 at worst during all functional tasks and activities  Baseline:  Goal status: INITIAL  3.  Will have better understanding of biomechanics and ergonomics  Baseline:  Goal status: INITIAL   LONG TERM GOALS: Target date: 11/12/2021  MMT to improve to 5/5 globally  Baseline:  Goal status: INITIAL  2.  Will be able to complete double leg lower test without low back arching to show improved core strength Baseline:  Goal status: INITIAL  3.  Pain to be no more than 1/10 with all functional recreational tasks and activities  Baseline:  Goal status: INITIAL  4.  Will be compliant with appropriate gym based exercise program to maintain functional gains and prevent recurrence of pain  Baseline:  Goal status: INITIAL   PLAN: PT FREQUENCY: 2x/week  PT DURATION: 6 weeks  PLANNED INTERVENTIONS: Therapeutic exercises, Therapeutic activity, Neuromuscular re-education, Balance training, Gait training,  Patient/Family education, Self Care, Joint mobilization, Stair training, Orthotic/Fit training, Aquatic Therapy, Dry Needling, Spinal mobilization, Cryotherapy, Moist heat, Taping, Traction, Ultrasound, Ionotophoresis 4mg /ml Dexamethasone, Manual therapy, and Re-evaluation.  PLAN FOR NEXT SESSION: focus on core strength, postural strength and training; keep progressing biomechanics training.  Cont with STW if pt responds well.   III PT, DPT 10/17/21 10:16 PM

## 2021-10-17 ENCOUNTER — Encounter (HOSPITAL_BASED_OUTPATIENT_CLINIC_OR_DEPARTMENT_OTHER): Payer: Self-pay | Admitting: Physical Therapy

## 2021-10-18 ENCOUNTER — Ambulatory Visit (INDEPENDENT_AMBULATORY_CARE_PROVIDER_SITE_OTHER): Payer: 59

## 2021-10-18 ENCOUNTER — Encounter: Payer: Self-pay | Admitting: Physician Assistant

## 2021-10-18 ENCOUNTER — Ambulatory Visit: Payer: 59 | Attending: Physician Assistant | Admitting: Physician Assistant

## 2021-10-18 VITALS — BP 110/78 | HR 79 | Resp 15 | Ht 61.0 in | Wt 136.0 lb

## 2021-10-18 DIAGNOSIS — M06322 Rheumatoid nodule, left elbow: Secondary | ICD-10-CM

## 2021-10-18 DIAGNOSIS — G8929 Other chronic pain: Secondary | ICD-10-CM

## 2021-10-18 DIAGNOSIS — Z8679 Personal history of other diseases of the circulatory system: Secondary | ICD-10-CM

## 2021-10-18 DIAGNOSIS — M545 Low back pain, unspecified: Secondary | ICD-10-CM

## 2021-10-18 DIAGNOSIS — M0579 Rheumatoid arthritis with rheumatoid factor of multiple sites without organ or systems involvement: Secondary | ICD-10-CM

## 2021-10-18 DIAGNOSIS — M7731 Calcaneal spur, right foot: Secondary | ICD-10-CM

## 2021-10-18 DIAGNOSIS — Z9889 Other specified postprocedural states: Secondary | ICD-10-CM

## 2021-10-18 DIAGNOSIS — M06321 Rheumatoid nodule, right elbow: Secondary | ICD-10-CM

## 2021-10-18 DIAGNOSIS — Z79899 Other long term (current) drug therapy: Secondary | ICD-10-CM | POA: Diagnosis not present

## 2021-10-18 DIAGNOSIS — M7732 Calcaneal spur, left foot: Secondary | ICD-10-CM

## 2021-10-18 DIAGNOSIS — E7800 Pure hypercholesterolemia, unspecified: Secondary | ICD-10-CM

## 2021-10-18 DIAGNOSIS — F419 Anxiety disorder, unspecified: Secondary | ICD-10-CM

## 2021-10-18 NOTE — Patient Instructions (Signed)
Standing Labs We placed an order today for your standing lab work.   Please have your standing labs drawn in October and every 3 months   If possible, please have your labs drawn 2 weeks prior to your appointment so that the provider can discuss your results at your appointment.  Please note that you may see your imaging and lab results in MyChart before we have reviewed them. We may be awaiting multiple results to interpret others before contacting you. Please allow our office up to 72 hours to thoroughly review all of the results before contacting the office for clarification of your results.  We currently have open lab daily: Monday through Thursday from 1:30 PM-4:30 PM and Friday from 1:30 PM- 4:00 PM If possible, please come for your lab work on Monday, Thursday or Friday afternoons, as you may experience shorter wait times.   Effective December 26, 2021 the new lab hours will change to: Monday through Thursday from 1:30 PM-5:00 PM and Friday from 8:30 AM-12:00 PM If possible, please come for your lab work on Monday and Thursday afternoons, as you may experience shorter wait times.  Please be advised, all patients with office appointments requiring lab work will take precedent over walk-in lab work.    The office is located at [address], [address], [place], [place] [address] No appointment is necessary.   Labs are drawn by [facility]. Please bring your co-pay at the time of your lab draw.  You may receive a bill from [facility] for your lab work.  Please note if you are on Hydroxychloroquine and and an order has been placed for a Hydroxychloroquine level, you will need to have it drawn 4 hours or more after your last dose.  If you wish to have your labs drawn at another location, please call the office 24 hours in advance to send orders.  If you have any questions regarding directions or hours of operation,  please call [phone].   As a reminder, please drink plenty of water prior  to coming for your lab work. Thanks!  If you have signs or symptoms of an infection or start antibiotics: First, call your PCP for workup of your infection. Hold your medication through the infection, until you complete your antibiotics, and until symptoms resolve if you take the following: Injectable medication (Actemra, Benlysta, Cimzia, Cosentyx, Enbrel, Humira, Kevzara, Orencia, Remicade, Simponi, Stelara, Taltz, Tremfya) Methotrexate Leflunomide (Arava) Mycophenolate (Cellcept) Osborne [place], or Rinvoq    Because you are taking Harriette Ohara, Rinvoq, or Olumiant, it is very important to know that this class of medications has a FDA BLACK BOX WARNING for major adverse cardiovascular events (MACE), thrombosis, mortality (including sudden cardiovascular death), serious infections, and lymphomas. MACE is defined as cardiovascular death, myocardial infarction, and stroke. Thrombosis includes deep venous thrombosis (DVT), pulmonary embolism (PE), and arterial thrombosis. If you are a current or former smoker, you are at higher risk for MACE.

## 2021-10-19 ENCOUNTER — Telehealth: Payer: Self-pay | Admitting: Rheumatology

## 2021-10-19 LAB — CBC WITH DIFFERENTIAL/PLATELET
Absolute Monocytes: 383 cells/uL (ref 200–950)
Basophils Absolute: 10 cells/uL (ref 0–200)
Basophils Relative: 0.2 %
Eosinophils Absolute: 133 cells/uL (ref 15–500)
Eosinophils Relative: 2.6 %
HCT: 41.4 % (ref 35.0–45.0)
Hemoglobin: 13.8 g/dL (ref 11.7–15.5)
Lymphs Abs: 989 cells/uL (ref 850–3900)
MCH: 31.1 pg (ref 27.0–33.0)
MCHC: 33.3 g/dL (ref 32.0–36.0)
MCV: 93.2 fL (ref 80.0–100.0)
MPV: 9.7 fL (ref 7.5–12.5)
Monocytes Relative: 7.5 %
Neutro Abs: 3585 cells/uL (ref 1500–7800)
Neutrophils Relative %: 70.3 %
Platelets: 383 10*3/uL (ref 140–400)
RBC: 4.44 10*6/uL (ref 3.80–5.10)
RDW: 13 % (ref 11.0–15.0)
Total Lymphocyte: 19.4 %
WBC: 5.1 10*3/uL (ref 3.8–10.8)

## 2021-10-19 LAB — LIPID PANEL
Cholesterol: 236 mg/dL — ABNORMAL HIGH (ref <200)
HDL: 79 mg/dL (ref >=50)
LDL Cholesterol (Calc): 132 mg/dL (calc) — ABNORMAL HIGH
Non-HDL Cholesterol (Calc): 157 mg/dL (calc) — ABNORMAL HIGH (ref <130)
Total CHOL/HDL Ratio: 3 (calc) (ref <5.0)
Triglycerides: 135 mg/dL (ref <150)

## 2021-10-19 NOTE — Telephone Encounter (Signed)
See lab note for details.  

## 2021-10-19 NOTE — Telephone Encounter (Signed)
Patient left a voicemail requesting a call back with lab results.

## 2021-10-19 NOTE — Progress Notes (Signed)
CBC WNL. WBC count has returned to WNL.  Total cholesterol remains elevated-236 and LDL is elevated-132.  Patient remains on rinvoq and will require recheck at least every 6 months.  Please notify the patient and forward results to PCP.

## 2021-10-23 ENCOUNTER — Encounter (HOSPITAL_BASED_OUTPATIENT_CLINIC_OR_DEPARTMENT_OTHER): Payer: 59 | Admitting: Physical Therapy

## 2021-10-25 ENCOUNTER — Encounter (HOSPITAL_BASED_OUTPATIENT_CLINIC_OR_DEPARTMENT_OTHER): Payer: 59 | Admitting: Physical Therapy

## 2021-10-28 NOTE — Therapy (Signed)
OUTPATIENT PHYSICAL THERAPY THORACOLUMBAR TREATMENT   Patient Name: Vickie [name] MRN: [id number] DOB:02-28-1965, 56 y.o., female Today's Date: 10/30/2021   PT End of Session - 10/30/21 1538     Visit Number 6    Number of Visits 13    Date for PT Re-Evaluation 11/12/21    Authorization Type UHC    Authorization Time Period 10/01/21 to 11/12/21    PT Start Time 1519    PT Stop Time 1602    PT Time Calculation (min) 43 min    Activity Tolerance Patient tolerated treatment well    Behavior During Therapy [facility] for tasks assessed/performed                 Past Medical History:  Diagnosis Date   Anxiety 10/12/2020   Arthritis    RA Dx at age 28   Bell's palsy    Chicken pox    Hypertension    PVC (premature ventricular contraction) 01/02/2017   Rheumatoid arthritis (HCC)    Snoring 01/02/2017   Past Surgical History:  Procedure Laterality Date   BREAST BIOPSY Left    unsure of time  - ?2016 in [place]   CESAREAN SECTION     FOOT SURGERY Left 04/2019   Patient Active Problem List   Diagnosis Date Noted   Anxiety 10/12/2020   PVC (premature ventricular contraction) 01/02/2017   Snoring 01/02/2017   High risk medication use 10/24/2016   Calcaneal spur of both feet 10/24/2016   Rheumatoid arthritis (HCC) 08/07/2016   Hypertension, essential 08/07/2016    PCP: [place], Betty G, MD   REFERRING PROVIDER: [place], Betty G, MD   REFERRING DIAG: M54.50 (ICD-10-CM) - Bilateral low back pain without sciatica, unspecified chronicity   Rationale for Evaluation and Treatment Rehabilitation  THERAPY DIAG:  Other low back pain  Muscle weakness (generalized)  Abnormal posture  ONSET DATE: 09/14/2021   SUBJECTIVE:                                                                                                                                                                                           SUBJECTIVE STATEMENT: Pt states she felt really good after prior Rx.  Pt  states "When I do my exercises, I feel good".  Pt states she was doing her exercises though hasn't done them as much recently.  She has been out of town.  Pt saw her rheumatologist and had x rays and blood work.  Pt states x rays look like they did last time and hasn't progressed.  Pt states her pain shifts and changes places.     PERTINENT HISTORY:  Vickie [name] is a 56 year old female with history of GERD, hypertension, RA, and PVCs complaining of lower back pain as described above. She has had the pain intermittently for a while but this time seems to be more persistent. Pain is intermittently, not radiated, middle of lower back and left-sided, achy pain, 7-8/10. Exacerbated by sitting and alleviated by walking and lying down.   Negative for recent injury or unusual level of activity.   No associated LE numbness, tingling, urinary incontinence or retention, stool incontinence, or saddle anesthesia. Negative for fever, abnormal weight loss, unusual fatigue, abdominal pain, nausea, vomiting, changes in bowel habits, urinary symptoms, rash or edema on area.    OTC medications: She has tried Aleve, which helps. Lumbar x-ray done in 08/2019 at her rheumatologist'soffice: Lumbar scoliosis noted, multilevel spondylosis and facet joint arthropathy.   Hypertension on amlodipine 5 mg daily.. BP is at her rheumatologist office has been in normal range, 120s/80s. Negative for CP, dyspnea, edema, focal neurologic deficit.  PAIN:  Are you having pain? Yes: NPRS scale: 3/10 Pain location: R sided lower lateral lumbar  Pain description: ache Aggravating factors: varies  Relieving factors: varies    PRECAUTIONS: None  WEIGHT BEARING RESTRICTIONS No  FALLS:  Has patient fallen in last 6 months? 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, get up on step stools   PLOF: Independent, Independent with basic ADLs, Independent with gait, and Independent with transfers  PATIENT GOALS get rid of pain, fix what's causing the problem    OBJECTIVE:   DIAGNOSTIC FINDINGS:     TODAY'S TREATMENT   Therapeutic Exercise: Nustep L5 x 5 minutes BLEs only Supine alt UE/LE with PPT x10 Supine dead bug with LE not supported x 10 reps  PPT with double bent leg lower 2x10  Supine Alt UE ext with contralateral SLR 1#/2# UE/LE 2x10 reps   Sidelying clams green TB 2x10 green TB  Q-ped birddogs with TrA with dowel on back for form Paloff Press with TrA with RTB x10 each  Manual Therapy:  STM with trigger point release to bilat glute in S/L'ing with pillow b/w knees to improve pain and soft tissue tightness and reduce myofascial restrictions and adhesions.    PATIENT EDUCATION:  Education details: POC, HEP, exercise form, and rationale of exercises Person educated: Patient Education method: Explanation, demonstration, verbal cues Education comprehension: verbalized understanding and returned demonstration, verbal cues required   HOME EXERCISE PROGRAM:  [id number]  ASSESSMENT:  CLINICAL IMPRESSION: Pt reports improved sx's with HEP.  She is progressing well with core strength as evidenced by progression and performance of exercises.  Pt still has difficulty with q-ped birddogs.  She states she feels better performing q ped birddogs without dowel on her back and has been performing at home some.  Pt reports discomfort in bilat hands with q-ped birddogs.  PT had pt perform birddogs on p-ball and Pt had improved comfort in hands and states she likes that exercise better.  Pt had no significant tightness and tenderness in L glute though did have tightness and  trigger points in L glute.  Pt responded well to Rx stating she felt  much better after exercises.   She should cont to benefit from cont skilled PT services to address ongoing goals and to assist in restoring PLOF.  OBJECTIVE IMPAIRMENTS Abnormal gait, decreased mobility, difficulty walking, decreased strength, hypomobility, increased fascial restrictions, impaired flexibility, improper body mechanics, postural dysfunction, and pain.   ACTIVITY LIMITATIONS carrying, lifting, bending, standing, squatting, stairs, transfers, and locomotion level  PARTICIPATION LIMITATIONS: shopping, community activity, occupation, and yard work  PERSONAL FACTORS Age, Behavior pattern, Fitness, Past/current experiences, and Time since onset of injury/illness/exacerbation are also affecting patient's functional outcome.   REHAB POTENTIAL: Good  CLINICAL DECISION MAKING: Stable/uncomplicated  EVALUATION COMPLEXITY: Low   GOALS: Goals reviewed with patient? Yes  SHORT TERM GOALS: Target date: 10/22/2021  Will be compliant with appropriate progressive HEP  Baseline: Goal status: INITIAL  2.  Pain to be no more than 3/10 at worst during all functional tasks and activities  Baseline:  Goal status: INITIAL  3.  Will have better understanding of biomechanics and ergonomics  Baseline:  Goal status: INITIAL   LONG TERM GOALS: Target date: 11/12/2021  MMT to improve to 5/5 globally  Baseline:  Goal status: INITIAL  2.  Will be able to complete double leg lower test without low back arching to show improved core strength Baseline:  Goal status: INITIAL  3.  Pain to be no more than 1/10 with all functional recreational tasks and activities  Baseline:  Goal status: INITIAL  4.  Will be compliant with appropriate gym based exercise program to maintain functional gains and prevent recurrence of pain  Baseline:  Goal status: INITIAL   PLAN: PT FREQUENCY: 2x/week  PT DURATION: 6 weeks  PLANNED  INTERVENTIONS: Therapeutic exercises, Therapeutic activity, Neuromuscular re-education, Balance training, Gait training, Patient/Family education, Self Care, Joint mobilization, Stair training, Orthotic/Fit training, Aquatic Therapy, Dry Needling, Spinal mobilization, Cryotherapy, Moist heat, Taping, Traction, Ultrasound, Ionotophoresis 4mg /ml Dexamethasone, Manual therapy, and Re-evaluation.  PLAN FOR NEXT SESSION: focus on core strength, postural strength and training; keep progressing biomechanics training.  Cont with STW if pt responds well.   Selinda Michaels III PT, DPT 10/30/21 4:21 PM

## 2021-10-30 ENCOUNTER — Encounter (HOSPITAL_BASED_OUTPATIENT_CLINIC_OR_DEPARTMENT_OTHER): Payer: Self-pay | Admitting: Physical Therapy

## 2021-10-30 ENCOUNTER — Ambulatory Visit (HOSPITAL_BASED_OUTPATIENT_CLINIC_OR_DEPARTMENT_OTHER): Payer: 59 | Attending: Family Medicine | Admitting: Physical Therapy

## 2021-10-30 DIAGNOSIS — M5459 Other low back pain: Secondary | ICD-10-CM | POA: Diagnosis present

## 2021-10-30 DIAGNOSIS — R293 Abnormal posture: Secondary | ICD-10-CM | POA: Insufficient documentation

## 2021-10-30 DIAGNOSIS — M6281 Muscle weakness (generalized): Secondary | ICD-10-CM | POA: Insufficient documentation

## 2021-11-01 ENCOUNTER — Ambulatory Visit (HOSPITAL_BASED_OUTPATIENT_CLINIC_OR_DEPARTMENT_OTHER): Payer: 59 | Admitting: Physical Therapy

## 2021-11-01 ENCOUNTER — Encounter: Payer: Self-pay | Admitting: Family Medicine

## 2021-11-01 ENCOUNTER — Encounter (HOSPITAL_BASED_OUTPATIENT_CLINIC_OR_DEPARTMENT_OTHER): Payer: Self-pay | Admitting: Physical Therapy

## 2021-11-01 DIAGNOSIS — R293 Abnormal posture: Secondary | ICD-10-CM

## 2021-11-01 DIAGNOSIS — M6281 Muscle weakness (generalized): Secondary | ICD-10-CM

## 2021-11-01 DIAGNOSIS — M5459 Other low back pain: Secondary | ICD-10-CM | POA: Diagnosis not present

## 2021-11-01 NOTE — Therapy (Signed)
OUTPATIENT PHYSICAL THERAPY THORACOLUMBAR TREATMENT   Patient Name: Vickie [name] MRN: [id number] DOB:[birth year], 56 y.o., female Today's Date: 11/01/2021   PT End of Session - 11/01/21 0940     Visit Number 7    Number of Visits 13    Date for PT Re-Evaluation 11/12/21    Authorization Type U[employer]    Authorization Time Period 10/01/21 to 11/12/21    PT Start Time 0934    PT Stop Time 1014    PT Time Calculation (min) 40 min    Activity Tolerance Patient tolerated treatment well    Behavior During Therapy [facility] for tasks assessed/performed                  Past Medical History:  Diagnosis Date   Anxiety 10/12/2020   Arthritis    RA Dx at age 39   Bell's palsy    Chicken pox    Hypertension    PVC (premature ventricular contraction) 01/02/2017   Rheumatoid arthritis (HCC)    Snoring 01/02/2017   Past Surgical History:  Procedure Laterality Date   BREAST BIOPSY Left    unsure of time  - ?2016 in [place]   CESAREAN SECTION     FOOT SURGERY Left 04/2019   Patient Active Problem List   Diagnosis Date Noted   Anxiety 10/12/2020   PVC (premature ventricular contraction) 01/02/2017   Snoring 01/02/2017   High risk medication use 10/24/2016   Calcaneal spur of both feet 10/24/2016   Rheumatoid arthritis (HCC) 08/07/2016   Hypertension, essential 08/07/2016    PCP: [place], Betty G, MD   REFERRING PROVIDER: [place], Betty G, MD   REFERRING DIAG: M54.50 (ICD-10-CM) - Bilateral low back pain without sciatica, unspecified chronicity   Rationale for Evaluation and Treatment Rehabilitation  THERAPY DIAG:  Other low back pain  Muscle weakness (generalized)  Abnormal posture  ONSET DATE: 09/14/2021   SUBJECTIVE:                                                                                                                                                                                           SUBJECTIVE STATEMENT: Pt states her pain shifts and changes places.  Pt  states she slept better.  Pt states she feels better when she does her exercises.  She performed her exercises yesterday.  Pt states her physioball is the same size as the one she used in the clinic.  Pt denies any adverse effects after prior Rx.    PERTINENT HISTORY:  Vickie [name] is a 56 year old female with history of GERD, hypertension, RA, and PVCs complaining of lower back pain as  described above. She has had the pain intermittently for a while but this time seems to be more persistent. Pain is intermittently, not radiated, middle of lower back and left-sided, achy pain, 7-8/10. Exacerbated by sitting and alleviated by walking and lying down.   Negative for recent injury or unusual level of activity.   No associated LE numbness, tingling, urinary incontinence or retention, stool incontinence, or saddle anesthesia. Negative for fever, abnormal weight loss, unusual fatigue, abdominal pain, nausea, vomiting, changes in bowel habits, urinary symptoms, rash or edema on area.    OTC medications: She has tried Aleve, which helps. Lumbar x-ray done in 08/2019 at her rheumatologist'soffice: Lumbar scoliosis noted, multilevel spondylosis and facet joint arthropathy.   Hypertension on amlodipine 5 mg daily.. BP is at her rheumatologist office has been in normal range, 120s/80s. Negative for CP, dyspnea, edema, focal neurologic deficit.  PAIN:  Are you having pain? Yes: NPRS scale: 1/10 Pain location: L sided lower lumbar  Pain description: ache Aggravating factors: varies  Relieving factors: varies    PRECAUTIONS: None  WEIGHT BEARING RESTRICTIONS No  FALLS:  Has patient fallen in last 6 months? 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, get up on step stools   PLOF: Independent, Independent with basic ADLs, Independent with gait, and Independent with transfers  PATIENT GOALS get rid of pain, fix what's causing the problem    OBJECTIVE:   DIAGNOSTIC FINDINGS:     TODAY'S TREATMENT   Therapeutic Exercise: Nustep L4-5 x 5 minutes BLEs only Supine dead bug with LE not supported 2 x 10 reps  PPT with double bent leg lower 2x10  Supine Alt UE ext with contralateral SLR 1#/2# UE/LE 2x10 reps with TrA Sidelying clams green TB 2x10 green TB  Q-ped birddogs on p-ball 2x10 (Pt also performed last visit) Seated on p-ball:  Alt UE/LE 2x10  LAQ 2x10 Paloff Press with TrA with GTB 2x10 each   PATIENT EDUCATION:  Education details: POC, HEP, exercise form, and rationale of exercises Person educated: Patient Education method: Explanation, demonstration, verbal cues Education comprehension: verbalized understanding and returned demonstration, verbal cues required   HOME EXERCISE PROGRAM:  [id number]  ASSESSMENT:  CLINICAL IMPRESSION: Pt reports improved sx's with HEP.  She is progressing well with core strength as evidenced by progression and performance of exercises.  PT has a physioball at home the same size of the one used here.  PT instructed pt in physioball exercises and she performed them well with cuing.   Pt performed birddogs on p-ball well and prefers on p-ball compared to quadruped.  Pt responded well to Rx reporting improved pain from 1/10 before Rx to 0/10 after Rx.  She should cont to benefit from cont skilled PT services to address ongoing goals and to assist in restoring PLOF.  OBJECTIVE IMPAIRMENTS Abnormal gait, decreased mobility, difficulty walking, decreased strength, hypomobility, increased fascial restrictions, impaired flexibility, improper body mechanics, postural dysfunction, and pain.   ACTIVITY  LIMITATIONS carrying, lifting, bending, standing, squatting, stairs, transfers, and locomotion level  PARTICIPATION LIMITATIONS: shopping, community activity, occupation, and yard work  PERSONAL FACTORS Age, Behavior pattern, Fitness, Past/current experiences, and  Time since onset of injury/illness/exacerbation are also affecting patient's functional outcome.   REHAB POTENTIAL: Good  CLINICAL DECISION MAKING: Stable/uncomplicated  EVALUATION COMPLEXITY: Low   GOALS: Goals reviewed with patient? Yes  SHORT TERM GOALS: Target date: 10/22/2021  Will be compliant with appropriate progressive HEP  Baseline: Goal status: INITIAL  2.  Pain to be no more than 3/10 at worst during all functional tasks and activities  Baseline:  Goal status: INITIAL  3.  Will have better understanding of biomechanics and ergonomics  Baseline:  Goal status: INITIAL   LONG TERM GOALS: Target date: 11/12/2021  MMT to improve to 5/5 globally  Baseline:  Goal status: INITIAL  2.  Will be able to complete double leg lower test without low back arching to show improved core strength Baseline:  Goal status: INITIAL  3.  Pain to be no more than 1/10 with all functional recreational tasks and activities  Baseline:  Goal status: INITIAL  4.  Will be compliant with appropriate gym based exercise program to maintain functional gains and prevent recurrence of pain  Baseline:  Goal status: INITIAL   PLAN: PT FREQUENCY: 2x/week  PT DURATION: 6 weeks  PLANNED INTERVENTIONS: Therapeutic exercises, Therapeutic activity, Neuromuscular re-education, Balance training, Gait training, Patient/Family education, Self Care, Joint mobilization, Stair training, Orthotic/Fit training, Aquatic Therapy, Dry Needling, Spinal mobilization, Cryotherapy, Moist heat, Taping, Traction, Ultrasound, Ionotophoresis 4mg /ml Dexamethasone, Manual therapy, and Re-evaluation.  PLAN FOR NEXT SESSION: focus on core strength, postural  strength and training; keep progressing biomechanics training.  Cont with STW as needed.   III PT, DPT 11/01/21 2:29 PM

## 2021-11-05 ENCOUNTER — Ambulatory Visit (HOSPITAL_BASED_OUTPATIENT_CLINIC_OR_DEPARTMENT_OTHER): Payer: 59 | Admitting: Physical Therapy

## 2021-11-07 ENCOUNTER — Ambulatory Visit (HOSPITAL_BASED_OUTPATIENT_CLINIC_OR_DEPARTMENT_OTHER): Payer: 59 | Admitting: Physical Therapy

## 2021-11-07 ENCOUNTER — Encounter (HOSPITAL_BASED_OUTPATIENT_CLINIC_OR_DEPARTMENT_OTHER): Payer: Self-pay | Admitting: Physical Therapy

## 2021-11-07 DIAGNOSIS — M5459 Other low back pain: Secondary | ICD-10-CM | POA: Diagnosis not present

## 2021-11-07 DIAGNOSIS — R293 Abnormal posture: Secondary | ICD-10-CM

## 2021-11-07 DIAGNOSIS — M6281 Muscle weakness (generalized): Secondary | ICD-10-CM

## 2021-11-07 NOTE — Therapy (Signed)
OUTPATIENT PHYSICAL THERAPY THORACOLUMBAR TREATMENT   Patient Name: Vickie [name] MRN: [id number] DOB:January 24, 1966, 56 y.o., female Today's Date: 11/07/2021   PT End of Session - 11/07/21 0845     Visit Number 8    Number of Visits 13    Date for PT Re-Evaluation 11/12/21    Authorization Type U[employer]    Authorization Time Period 10/01/21 to 11/12/21    PT Start Time 0845    PT Stop Time 0925    PT Time Calculation (min) 40 min    Activity Tolerance Patient tolerated treatment well    Behavior During Therapy [facility] for tasks assessed/performed                  Past Medical History:  Diagnosis Date   Anxiety 10/12/2020   Arthritis    RA Dx at age 64   Bell's palsy    Chicken pox    Hypertension    PVC (premature ventricular contraction) 01/02/2017   Rheumatoid arthritis (HCC)    Snoring 01/02/2017   Past Surgical History:  Procedure Laterality Date   BREAST BIOPSY Left    unsure of time  - ?2016 in [place]   CESAREAN SECTION     FOOT SURGERY Left 04/2019   Patient Active Problem List   Diagnosis Date Noted   Anxiety 10/12/2020   PVC (premature ventricular contraction) 01/02/2017   Snoring 01/02/2017   High risk medication use 10/24/2016   Calcaneal spur of both feet 10/24/2016   Rheumatoid arthritis (HCC) 08/07/2016   Hypertension, essential 08/07/2016    PCP: [place], Betty G, MD   REFERRING PROVIDER: [place], Betty G, MD   REFERRING DIAG: M54.50 (ICD-10-CM) - Bilateral low back pain without sciatica, unspecified chronicity   Rationale for Evaluation and Treatment Rehabilitation  THERAPY DIAG:  Other low back pain  Muscle weakness (generalized)  Abnormal posture  ONSET DATE: 09/14/2021   SUBJECTIVE:                                                                                                                                                                                           SUBJECTIVE STATEMENT: Pt states the back is still improving. She was not  excessively sore after last session. She is steadily improving with minor "twinges" here and there that move around.   PERTINENT HISTORY:  Vickie [name] is a 56 year old female with history of GERD, hypertension, RA, and PVCs complaining of lower back pain as described above. She has had the pain intermittently for a while but this time seems to be more persistent. Pain is intermittently, not radiated, middle of lower back and left-sided,  achy pain, 7-8/10. Exacerbated by sitting and alleviated by walking and lying down.   Negative for recent injury or unusual level of activity.   No associated LE numbness, tingling, urinary incontinence or retention, stool incontinence, or saddle anesthesia. Negative for fever, abnormal weight loss, unusual fatigue, abdominal pain, nausea, vomiting, changes in bowel habits, urinary symptoms, rash or edema on area.    OTC medications: She has tried Aleve, which helps. Lumbar x-ray done in 08/2019 at her rheumatologist'soffice: Lumbar scoliosis noted, multilevel spondylosis and facet joint arthropathy.   Hypertension on amlodipine 5 mg daily.. BP is at her rheumatologist office has been in normal range, 120s/80s. Negative for CP, dyspnea, edema, focal neurologic deficit.  PAIN:  Are you having pain? Yes: NPRS scale: 1-2/10 Pain location: L sided outer hip  Pain description: ache Aggravating factors: varies  Relieving factors: varies    PRECAUTIONS: None  WEIGHT BEARING RESTRICTIONS No  FALLS:  Has patient fallen in last 6 months? 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, get up on step stools   PLOF: Independent,  Independent with basic ADLs, Independent with gait, and Independent with transfers  PATIENT GOALS get rid of pain, fix what's causing the problem    OBJECTIVE:   DIAGNOSTIC FINDINGS:     TODAY'S TREATMENT   Therapeutic Exercise: Nustep L4-5 x 5 minutes BLEs only  Seated on p-ball:  Alt UE/LE 2x10 Paloff Press with TrA with 10lb cable 2x10 each Suitcase carry 15lbs 3ft 1x lap each hand down hallway Cable straight arm pull down 2x10 20lbs Bridge on red swissball 2x10 (knees bent)     PATIENT EDUCATION:  Education details: POC, HEP, exercise form, and rationale of exercises Person educated: Patient Education method: Explanation, demonstration, verbal cues Education comprehension: verbalized understanding and returned demonstration, verbal cues required   HOME EXERCISE PROGRAM:  [id number]  ASSESSMENT:  CLINICAL IMPRESSION: Pt able to progress lumbopelvic strength and motor control exercise today without increase in pain. Pt with good technique once given VC and TC for abdominal bracing and hip engagement. Pt HEP updated today and pt advised on machines she is able to do in the gym. Pt given edu about most machines being safe and to hold on freeweights at this time until form can be assessed. If not adverse response to today's session, consider lifting mechanics edu for progression towards occupational related goals. She should cont to benefit from cont skilled PT services to address ongoing goals and to assist in restoring PLOF.  OBJECTIVE IMPAIRMENTS Abnormal gait, decreased mobility, difficulty walking, decreased strength, hypomobility, increased fascial restrictions, impaired flexibility, improper body mechanics, postural dysfunction, and pain.   ACTIVITY LIMITATIONS carrying, lifting, bending, standing, squatting, stairs, transfers, and locomotion level  PARTICIPATION LIMITATIONS: shopping, community activity, occupation, and yard work  PERSONAL FACTORS Age, Behavior  pattern, Fitness, Past/current experiences, and Time since onset of injury/illness/exacerbation are also affecting patient's functional outcome.   REHAB POTENTIAL: Good  CLINICAL DECISION MAKING: Stable/uncomplicated  EVALUATION COMPLEXITY: Low   GOALS: Goals reviewed with patient? Yes  SHORT TERM GOALS: Target date: 10/22/2021  Will be compliant with appropriate progressive HEP  Baseline: Goal status: INITIAL  2.  Pain to be no more than 3/10 at worst during all functional tasks and activities  Baseline:  Goal status: INITIAL  3.  Will have better understanding of biomechanics and ergonomics  Baseline:  Goal status: INITIAL   LONG TERM GOALS: Target date: 11/12/2021  MMT to improve to 5/5 globally  Baseline:  Goal status: INITIAL  2.  Will be able to complete double leg lower test without low back arching to show improved core strength Baseline:  Goal status: INITIAL  3.  Pain to be no more than 1/10 with all functional recreational tasks and activities  Baseline:  Goal status: INITIAL  4.  Will be compliant with appropriate gym based exercise program to maintain functional gains and prevent recurrence of pain  Baseline:  Goal status: INITIAL   PLAN: PT FREQUENCY: 2x/week  PT DURATION: 6 weeks  PLANNED INTERVENTIONS: Therapeutic exercises, Therapeutic activity, Neuromuscular re-education, Balance training, Gait training, Patient/Family education, Self Care, Joint mobilization, Stair training, Orthotic/Fit training, Aquatic Therapy, Dry Needling, Spinal mobilization, Cryotherapy, Moist heat, Taping, Traction, Ultrasound, Ionotophoresis 4mg /ml Dexamethasone, Manual therapy, and Re-evaluation.  PLAN FOR NEXT SESSION: focus on core strength, postural strength and training; keep progressing biomechanics training.  Cont with STW as needed.   Zebedee Iba PT, DPT 11/07/21 9:29 AM

## 2021-11-12 NOTE — Therapy (Incomplete)
OUTPATIENT PHYSICAL THERAPY THORACOLUMBAR TREATMENT    Patient Name: Vickie [name] MRN: [id number] DOB:Mar 29, 1965, 56 y.o., female Today's Date: 11/14/2021   PT End of Session - 11/13/21 0943     Visit Number 9    Number of Visits 11    Date for PT Re-Evaluation 11/20/21    Authorization Type [employer]    PT Start Time 0849    PT Stop Time 0934    PT Time Calculation (min) 45 min    Activity Tolerance Patient tolerated treatment well    Behavior During Therapy [facility] for tasks assessed/performed                   Past Medical History:  Diagnosis Date   Anxiety 10/12/2020   Arthritis    RA Dx at age 6   Bell's palsy    Chicken pox    Hypertension    PVC (premature ventricular contraction) 01/02/2017   Rheumatoid arthritis ([place])    Snoring 01/02/2017   Past Surgical History:  Procedure Laterality Date   BREAST BIOPSY Left    unsure of time  - ?2016 in [place] Left 04/2019   Patient Active Problem List   Diagnosis Date Noted   Anxiety 10/12/2020   PVC (premature ventricular contraction) 01/02/2017   Snoring 01/02/2017   High risk medication use 10/24/2016   Calcaneal spur of both feet 10/24/2016   Rheumatoid arthritis ([place]) 08/07/2016   Hypertension, essential 08/07/2016    PCP: Martinique, Betty G, MD   REFERRING PROVIDER: Martinique, Betty G, MD   REFERRING DIAG: M54.50 (ICD-10-CM) - Bilateral low back pain without sciatica, unspecified chronicity   Rationale for Evaluation and Treatment Rehabilitation  THERAPY DIAG:  Other low back pain  Muscle weakness (generalized)  Abnormal posture  ONSET DATE: 09/14/2021   SUBJECTIVE:                                                                                                                                                                                           SUBJECTIVE STATEMENT: Pt denies any adverse effects after prior Rx.  "Definitely don't have the pain I had".  Pt  states she is more mindful of what she is doing especially lifting. Pain is worse at the end of the day.  Pt denies any adverse effects after prior Rx.  Pt reports compliance with HEP.     PERTINENT HISTORY:  Ms. Elizondo is a 56 year old female with history of GERD, hypertension, RA, and PVCs complaining of lower back pain as described above. She has had the pain intermittently for  a while but this time seems to be more persistent. Pain is intermittently, not radiated, middle of lower back and left-sided, achy pain, 7-8/10. Exacerbated by sitting and alleviated by walking and lying down.   Negative for recent injury or unusual level of activity.   No associated LE numbness, tingling, urinary incontinence or retention, stool incontinence, or saddle anesthesia. Negative for fever, abnormal weight loss, unusual fatigue, abdominal pain, nausea, vomiting, changes in bowel habits, urinary symptoms, rash or edema on area.    OTC medications: She has tried Aleve, which helps. Lumbar x-ray done in 08/2019 at her rheumatologist'soffice: Lumbar scoliosis noted, multilevel spondylosis and facet joint arthropathy.   Hypertension on amlodipine 5 mg daily.. BP is at her rheumatologist office has been in normal range, 120s/80s. Negative for CP, dyspnea, edema, focal neurologic deficit.  PAIN:  Are you having pain? Yes: NPRS scale:  Current:  1/10; Worst:  4/10; Best: 0/10 Pain location: L sided outer hip  Pain description: ache Aggravating factors: varies  Relieving factors: varies    PRECAUTIONS: None  WEIGHT BEARING RESTRICTIONS No  FALLS:  Has patient fallen in last 6 months? 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# and reach overhead, get up on step stools   PLOF: Independent, Independent with basic ADLs, Independent with gait, and Independent with transfers  PATIENT GOALS get rid of pain, fix what's causing the problem    OBJECTIVE:   DIAGNOSTIC FINDINGS:     TODAY'S TREATMENT   FOTO:  77  PALPATION: TTP:  L glutes/piriformis, not on R    LUMBAR ROM:    Active  AROM    Flexion WFL   Extension WNL  Right lateral flexion WFL, feels tight  Left lateral flexion WFL, feels tight  Right rotation    Left rotation     (Blank rows = not tested)  LOWER EXTREMITY MMT:      MMT Right eval Left eval Right 11/13/21 Left 11/13/2021  Hip flexion 4+ 4+ 5/5 5/5  Hip extension 3+ 3+ 4+/5 4+/5  Hip abduction 3+ 4 5/5 5/5  Hip adduction        Hip internal rotation        Hip external rotation        Knee flexion 4+ 4+ 5/5 5/5  Knee extension 4+ 4+ 5/5 5/5  Ankle dorsiflexion 5 5    Ankle plantarflexion        Ankle inversion        Ankle eversion         (Blank rows = not tested)       Therapeutic Exercise: Reviewed goals, current function, pain levels, and HEP compliance. Assessed lumbar ROM and LE strength.  See above Pt completed FOTO.  See above Pt performed: Supine dead bug with LE not supported 2 x 10 reps  Paloff Press with TrA with 10lb cable 2x10 each Cable straight arm pull down 2x10 20lbs Bridge on red swissball x10 each with knees bent and legs straight  Q-ped birddogs on p-ball 2x10 (Pt also performed last visit) Seated on p-ball:            Alt UE/LE 2x10  LAQ 2x10  PT reviewed HEP and updated HEP.  PT gave pt an updated HEP handout and educated pt in correct form and appropriate frequency.     PATIENT EDUCATION:  Education details: POC, HEP, exercise form, and rationale of exercises Person educated: Patient Education method: Explanation, demonstration, verbal cues Education comprehension: verbalized  understanding and returned demonstration, verbal cues required   HOME EXERCISE PROGRAM:  [id number]  ASSESSMENT:  CLINICAL IMPRESSION: Pt has made great progress in PT including improved pain and sx's.  Pt is compliant with HEP and reports improved pain and sx's with HEP.  Pt demonstrates clinically significant improvement in self perceived disability with FOTO score improving from initially 60 to 77 currently.  She met her FOTO goal.  She knows appropriates body mechanics with squat to lift and is more aware of how she is lifting.  Pt has improved with core strength as evidenced by progression of core exercises.  She demonstrates improved bilat LE strength as evidenced by MMT and also has improved lumbar AROM.  She has met STG's #1,3 and partially met #2 and LTG's #1,3.  PT updated HEP and gave an advanced HEP handout.  PT educated pt with gym exercises and pt demonstrates good understanding.  She would benefit from 1-2 more sessions for independence with HEP and gym based exercises.  Pt is in agreement.      OBJECTIVE IMPAIRMENTS Abnormal gait, decreased mobility, difficulty walking, decreased strength, hypomobility, increased fascial restrictions, impaired flexibility, improper body mechanics, postural dysfunction, and pain.   ACTIVITY LIMITATIONS carrying, lifting, bending, standing, squatting, stairs, transfers, and locomotion level  PARTICIPATION LIMITATIONS: shopping, community activity, occupation, and yard work  PERSONAL FACTORS Age, Behavior pattern, Fitness, Past/current experiences, and Time since onset of injury/illness/exacerbation are also affecting patient's functional outcome.   REHAB POTENTIAL: Good  CLINICAL DECISION MAKING: Stable/uncomplicated  EVALUATION COMPLEXITY: Low   GOALS: Goals reviewed with patient? Yes  SHORT TERM GOALS: Target date: 10/22/2021  Will be compliant with appropriate progressive HEP  Baseline: Goal status: GOAL MET  2.  Pain to be no more  than 3/10 at worst during all functional tasks and activities  Baseline:  Goal status: 90% MET  3.  Will have better understanding of biomechanics and ergonomics  Baseline:  Goal status: GOAL MET   LONG TERM GOALS: Target date: 11/12/2021  MMT to improve to 5/5 globally  Baseline:  Goal status: 95% MET  2.  Will be able to complete double leg lower test without low back arching to show improved core strength Baseline:  Goal status: INITIAL  3.  Pain to be no more than 1/10 with all functional recreational tasks and activities  Baseline:  Goal status: PARTIALLY MET  4.  Will be compliant with appropriate gym based exercise program to maintain functional gains and prevent recurrence of pain  Baseline:  Goal status: PROGRESSING Target date:  11/20/2021   PLAN: PT FREQUENCY: 1-2 more visits  PT DURATION: 1 week  PLANNED INTERVENTIONS: Therapeutic exercises, Therapeutic activity, Neuromuscular re-education, Balance training, Gait training, Patient/Family education, Self Care, Joint mobilization, Stair training, Orthotic/Fit training, Aquatic Therapy, Dry Needling, Spinal mobilization, Cryotherapy, Moist heat, Taping, Traction, Ultrasound, Ionotophoresis 40m/ml Dexamethasone, Manual therapy, and Re-evaluation.  PLAN FOR NEXT SESSION:  1-2 more visits to promote independence with HEP and gym program.  Possible discharge next Rx and pt will transition to personal trainer.    RSelinda MichaelsIII PT, DPT 11/14/21 8:19 AM

## 2021-11-13 ENCOUNTER — Ambulatory Visit (HOSPITAL_BASED_OUTPATIENT_CLINIC_OR_DEPARTMENT_OTHER): Payer: 59 | Admitting: Physical Therapy

## 2021-11-13 ENCOUNTER — Encounter (HOSPITAL_BASED_OUTPATIENT_CLINIC_OR_DEPARTMENT_OTHER): Payer: Self-pay | Admitting: Physical Therapy

## 2021-11-13 DIAGNOSIS — M5459 Other low back pain: Secondary | ICD-10-CM | POA: Diagnosis not present

## 2021-11-13 DIAGNOSIS — M6281 Muscle weakness (generalized): Secondary | ICD-10-CM

## 2021-11-13 DIAGNOSIS — R293 Abnormal posture: Secondary | ICD-10-CM

## 2021-11-14 NOTE — Therapy (Signed)
OUTPATIENT PHYSICAL THERAPY THORACOLUMBAR TREATMENT / DISCHARGE SUMMARY   Patient Name: Vickie [name] MRN: [id number] DOB:02-17-1966, 56 y.o., female Today's Date: 11/14/2021           Past Medical History:  Diagnosis Date   Anxiety 10/12/2020   Arthritis    RA Dx at age 36   Bell's palsy    Chicken pox    Hypertension    PVC (premature ventricular contraction) 01/02/2017   Rheumatoid arthritis ([place])    Snoring 01/02/2017   Past Surgical History:  Procedure Laterality Date   BREAST BIOPSY Left    unsure of time  - ?2016 in [place] Left 04/2019   Patient Active Problem List   Diagnosis Date Noted   Anxiety 10/12/2020   PVC (premature ventricular contraction) 01/02/2017   Snoring 01/02/2017   High risk medication use 10/24/2016   Calcaneal spur of both feet 10/24/2016   Rheumatoid arthritis ([place]) 08/07/2016   Hypertension, essential 08/07/2016    PCP: Martinique, Betty G, MD   REFERRING PROVIDER: [place], Betty G, MD   REFERRING DIAG: M54.50 (ICD-10-CM) - Bilateral low back pain without sciatica, unspecified chronicity   Rationale for Evaluation and Treatment Rehabilitation  THERAPY DIAG:  No diagnosis found.  ONSET DATE: 09/14/2021   SUBJECTIVE:                                                                                                                                                                                           SUBJECTIVE STATEMENT: Pt denies any adverse effects after prior Rx.  "Definitely don't have the pain I had".  Pt states she is more mindful of what she is doing especially lifting. Pain is worse at the end of the day.  Pt denies any adverse effects after prior Rx.  Pt reports compliance with HEP.     PERTINENT HISTORY:  Vickie [name] is a 56 year old female with history of GERD, hypertension, RA, and PVCs complaining of lower back pain as described above. She has had the pain intermittently for a while  but this time seems to be more persistent. Pain is intermittently, not radiated, middle of lower back and left-sided, achy pain, 7-8/10. Exacerbated by sitting and alleviated by walking and lying down.   Negative for recent injury or unusual level of activity.   No associated LE numbness, tingling, urinary incontinence or retention, stool incontinence, or saddle anesthesia. Negative for fever, abnormal weight loss, unusual fatigue, abdominal pain, nausea, vomiting, changes in bowel habits, urinary symptoms, rash or edema on area.    OTC medications: She has tried Aleve,  which helps. Lumbar x-ray done in 08/2019 at her rheumatologist'soffice: Lumbar scoliosis noted, multilevel spondylosis and facet joint arthropathy.   Hypertension on amlodipine 5 mg daily.. BP is at her rheumatologist office has been in normal range, 120s/80s. Negative for CP, dyspnea, edema, focal neurologic deficit.  PAIN:  Are you having pain? Yes: NPRS scale:  Current:  1/10; Worst:  4/10; Best: 0/10 Pain location: L sided outer hip  Pain description: ache Aggravating factors: varies  Relieving factors: varies    PRECAUTIONS: None  WEIGHT BEARING RESTRICTIONS No  FALLS:  Has patient fallen in last 6 months? 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# and reach overhead, get up on step stools   PLOF: Independent, Independent with basic ADLs, Independent with gait, and Independent with transfers  PATIENT GOALS get rid of pain, fix what's causing the problem    OBJECTIVE:   DIAGNOSTIC FINDINGS:     TODAY'S TREATMENT   FOTO:  77  PALPATION: TTP:  L glutes/piriformis, not on R     LUMBAR ROM:    Active  AROM    Flexion WFL   Extension WNL  Right lateral flexion WFL, feels tight  Left lateral flexion WFL, feels tight  Right rotation    Left rotation     (Blank rows = not tested)  LOWER EXTREMITY MMT:      MMT Right eval Left eval Right 11/13/21 Left 11/13/2021  Hip flexion 4+ 4+ 5/5 5/5  Hip extension 3+ 3+ 4+/5 4+/5  Hip abduction 3+ 4 5/5 5/5  Hip adduction        Hip internal rotation        Hip external rotation        Knee flexion 4+ 4+ 5/5 5/5  Knee extension 4+ 4+ 5/5 5/5  Ankle dorsiflexion 5 5    Ankle plantarflexion        Ankle inversion        Ankle eversion         (Blank rows = not tested)       Therapeutic Exercise: Reviewed goals, current function, pain levels, and HEP compliance. Assessed lumbar ROM and LE strength.  See above Pt completed FOTO.  See above Pt performed: Supine dead bug with LE not supported 2 x 10 reps  Paloff Press with TrA with 10lb cable 2x10 each Cable straight arm pull down 2x10 20lbs Bridge on red swissball x10 each with knees bent and legs straight  Q-ped birddogs on p-ball 2x10 (Pt also performed last visit) Seated on p-ball:            Alt UE/LE 2x10            LAQ 2x10  PT reviewed HEP and updated HEP.  PT gave pt an updated HEP handout and educated pt in correct form and appropriate frequency.     PATIENT EDUCATION:  Education details: POC, HEP, exercise form, and rationale of exercises Person educated: Patient Education method: Explanation, demonstration, verbal cues Education comprehension: verbalized understanding and returned demonstration, verbal cues required   HOME EXERCISE PROGRAM:  [id number]  ASSESSMENT:  CLINICAL IMPRESSION: Pt has made great progress in PT including  improved pain and sx's.  Pt is compliant with HEP and reports improved pain and sx's with HEP.  Pt demonstrates clinically significant improvement in self perceived disability with FOTO score improving from  initially 60 to 77 currently.  She met her FOTO goal.  She knows appropriates body mechanics with squat to lift and is more aware of how she is lifting.  Pt has improved with core strength as evidenced by progression of core exercises.  She demonstrates improved bilat LE strength as evidenced by MMT and also has improved lumbar AROM.  She has met STG's #1,3 and partially met #2 and LTG's #1,3.  PT updated HEP and gave an advanced HEP handout.  PT educated pt with gym exercises and pt demonstrates good understanding.  She would benefit from 1-2 more sessions for independence with HEP and gym based exercises.  Pt is in agreement.      OBJECTIVE IMPAIRMENTS Abnormal gait, decreased mobility, difficulty walking, decreased strength, hypomobility, increased fascial restrictions, impaired flexibility, improper body mechanics, postural dysfunction, and pain.   ACTIVITY LIMITATIONS carrying, lifting, bending, standing, squatting, stairs, transfers, and locomotion level  PARTICIPATION LIMITATIONS: shopping, community activity, occupation, and yard work  PERSONAL FACTORS Age, Behavior pattern, Fitness, Past/current experiences, and Time since onset of injury/illness/exacerbation are also affecting patient's functional outcome.   REHAB POTENTIAL: Good  CLINICAL DECISION MAKING: Stable/uncomplicated  EVALUATION COMPLEXITY: Low   GOALS: Goals reviewed with patient? Yes  SHORT TERM GOALS: Target date: 10/22/2021  Will be compliant with appropriate progressive HEP  Baseline: Goal status: GOAL MET  2.  Pain to be no more than 3/10 at worst during all functional tasks and activities  Baseline:  Goal status: 90% MET  3.  Will have better understanding of biomechanics and ergonomics  Baseline:  Goal status: GOAL MET   LONG TERM GOALS: Target date: 11/12/2021  MMT to improve to 5/5 globally  Baseline:  Goal status: 95% MET  2.  Will be able to complete double leg lower test without low back arching  to show improved core strength Baseline:  Goal status: INITIAL  3.  Pain to be no more than 1/10 with all functional recreational tasks and activities  Baseline:  Goal status: PARTIALLY MET  4.  Will be compliant with appropriate gym based exercise program to maintain functional gains and prevent recurrence of pain  Baseline:  Goal status: PROGRESSING Target date:  11/20/2021   PLAN: PT FREQUENCY: 1-2 more visits  PT DURATION: 1 week  PLANNED INTERVENTIONS: Therapeutic exercises, Therapeutic activity, Neuromuscular re-education, Balance training, Gait training, Patient/Family education, Self Care, Joint mobilization, Stair training, Orthotic/Fit training, Aquatic Therapy, Dry Needling, Spinal mobilization, Cryotherapy, Moist heat, Taping, Traction, Ultrasound, Ionotophoresis 54m/ml Dexamethasone, Manual therapy, and Re-evaluation.  PLAN FOR NEXT SESSION:  1-2 more visits to promote independence with HEP and gym program.  Possible discharge next Rx and pt will transition to personal trainer.    RSelinda MichaelsIII PT, DPT 11/14/21 10:45 PM

## 2021-11-14 NOTE — Addendum Note (Signed)
Addended by: Marijo Sanes on: 11/14/2021 08:24 AM   Modules accepted: Orders

## 2021-11-15 ENCOUNTER — Encounter (HOSPITAL_BASED_OUTPATIENT_CLINIC_OR_DEPARTMENT_OTHER): Payer: Self-pay | Admitting: Physical Therapy

## 2021-11-15 ENCOUNTER — Ambulatory Visit (HOSPITAL_BASED_OUTPATIENT_CLINIC_OR_DEPARTMENT_OTHER): Payer: 59 | Admitting: Physical Therapy

## 2021-11-15 DIAGNOSIS — M6281 Muscle weakness (generalized): Secondary | ICD-10-CM

## 2021-11-15 DIAGNOSIS — M5459 Other low back pain: Secondary | ICD-10-CM | POA: Diagnosis not present

## 2021-12-09 ENCOUNTER — Other Ambulatory Visit: Payer: Self-pay | Admitting: Family Medicine

## 2021-12-09 DIAGNOSIS — I10 Essential (primary) hypertension: Secondary | ICD-10-CM

## 2022-01-07 NOTE — Progress Notes (Unsigned)
ACUTE VISIT No chief complaint on file.  HPI: [name] is a 56 y.o. female, who is here today complaining of *** HPI  Review of Systems See other pertinent positives and negatives in HPI.  Current Outpatient Medications on File Prior to Visit  Medication Sig Dispense Refill   amLODipine (NORVASC) 5 MG tablet TAKE 1 TABLET BY MOUTH  DAILY 90 tablet 3   cyclobenzaprine (FLEXERIL) 10 MG tablet Take 0.5-1 tablets (5-10 mg total) by mouth 3 (three) times daily as needed for muscle spasms. 30 tablet 0   famotidine (PEPCID) 40 MG tablet Take 1 tablet (40 mg total) by mouth daily. 30 tablet 2   folic acid (FOLVITE) 1 MG tablet Take 2 tablets (2 mg total) by mouth daily. 180 tablet 0   meloxicam (MOBIC) 15 MG tablet Take 1 tablet (15 mg total) by mouth daily as needed for pain. 30 tablet 0   RINVOQ 15 MG TB24 TAKE 1 TABLET BY MOUTH DAILY 90 tablet 0   No current facility-administered medications on file prior to visit.    Past Medical History:  Diagnosis Date   Anxiety 10/12/2020   Arthritis    RA Dx at age 35   Bell's palsy    Chicken pox    Hypertension    PVC (premature ventricular contraction) 01/02/2017   Rheumatoid arthritis ([facility])    Snoring 01/02/2017   Allergies  Allergen Reactions   Latex    Sulfa Antibiotics     Social History   Socioeconomic History   Marital status: Married    Spouse name: Not on file   Number of children: Not on file   Years of education: Not on file   Highest education level: Bachelor's degree (e.g., BA, AB, BS)  Occupational History   Not on file  Tobacco Use   Smoking status: Never    Passive exposure: Never   Smokeless tobacco: Never  Vaping Use   Vaping Use: Never used  Substance and Sexual Activity   Alcohol use: Yes    Comment: occ   Drug use: No   Sexual activity: Not on file  Other Topics Concern   Not on file  Social History Narrative   Not on file   Social Determinants of Health   Financial Resource Strain:  Low Risk  (01/05/2022)   Overall Financial Resource Strain (CARDIA)    Difficulty of Paying Living Expenses: Not very hard  Food Insecurity: No Food Insecurity (01/05/2022)   Hunger Vital Sign    Worried About Running Out of Food in the Last Year: Never true    Ran Out of Food in the Last Year: Never true  Transportation Needs: No Transportation Needs (01/05/2022)   PRAPARE - Administrator, Civil Service (Medical): No    Lack of Transportation (Non-Medical): No  Physical Activity: Insufficiently Active (01/05/2022)   Exercise Vital Sign    Days of Exercise per Week: 3 days    Minutes of Exercise per Session: 40 min  Stress: No Stress Concern Present (01/05/2022)   [employer] of Occupational Health - Occupational Stress Questionnaire    Feeling of Stress : Only a little  Social Connections: Socially Integrated (01/05/2022)   Social Connection and Isolation Panel [NHANES]    Frequency of Communication with Friends and Family: Twice a week    Frequency of Social Gatherings with Friends and Family: Twice a week    Attends Religious Services: 1 to 4 times per year  Active Member of Clubs or Organizations: Yes    Attends Banker Meetings: 1 to 4 times per year    Marital Status: Married    There were no vitals filed for this visit. There is no height or weight on file to calculate BMI.  Physical Exam  ASSESSMENT AND PLAN:  There are no diagnoses linked to this encounter.  No follow-ups on file.  Jaclynn Laumann G. [place], MD  [facility]. [place] office.  Discharge Instructions   None

## 2022-01-08 ENCOUNTER — Ambulatory Visit (INDEPENDENT_AMBULATORY_CARE_PROVIDER_SITE_OTHER): Payer: 59 | Admitting: Family Medicine

## 2022-01-08 ENCOUNTER — Encounter: Payer: Self-pay | Admitting: Family Medicine

## 2022-01-08 VITALS — BP 110/70 | HR 72 | Temp 97.7°F | Resp 12 | Ht 61.0 in | Wt 137.2 lb

## 2022-01-08 DIAGNOSIS — K111 Hypertrophy of salivary gland: Secondary | ICD-10-CM

## 2022-01-08 DIAGNOSIS — E785 Hyperlipidemia, unspecified: Secondary | ICD-10-CM | POA: Insufficient documentation

## 2022-01-08 DIAGNOSIS — I10 Essential (primary) hypertension: Secondary | ICD-10-CM

## 2022-01-08 DIAGNOSIS — H6121 Impacted cerumen, right ear: Secondary | ICD-10-CM | POA: Diagnosis not present

## 2022-01-08 DIAGNOSIS — H9191 Unspecified hearing loss, right ear: Secondary | ICD-10-CM | POA: Diagnosis not present

## 2022-01-08 NOTE — Patient Instructions (Addendum)
A few things to remember from today's visit:  Hypertension, essential  Hyperlipidemia, unspecified hyperlipidemia type  Hearing loss of right ear, unspecified hearing loss type  Hearing loss of right ear due to cerumen impaction  No changes in your medications today. Avoid Q tips.  If you need refills for medications you take chronically, please call your pharmacy. Do not use My Chart to request refills or for acute issues that need immediate attention. If you send a my chart message, it may take a few days to be addressed, specially if I am not in the office.  Please be sure medication list is accurate. If a new problem present, please set up appointment sooner than planned today.  Earwax Buildup, Adult The ears produce a substance called earwax that helps keep bacteria out of the ear and protects the skin in the ear canal. Occasionally, earwax can build up in the ear and cause discomfort or hearing loss. What are the causes? This condition is caused by a buildup of earwax. Ear canals are self-cleaning. Ear wax is made in the outer part of the ear canal and generally falls out in small amounts over time. When the self-cleaning mechanism is not working, earwax builds up and can cause decreased hearing and discomfort. Attempting to clean ears with cotton swabs can push the earwax deep into the ear canal and cause decreased hearing and pain. What increases the risk? This condition is more likely to develop in people who: Clean their ears often with cotton swabs. Pick at their ears. Use earplugs or in-ear headphones often, or wear hearing aids. The following factors may also make you more likely to develop this condition: Being female. Being of older age. Naturally producing more earwax. Having narrow ear canals. Having earwax that is overly thick or sticky. Having excess hair in the ear canal. Having eczema. Being dehydrated. What are the signs or symptoms? Symptoms of this  condition include: Reduced or muffled hearing. A feeling of fullness in the ear or feeling that the ear is plugged. Fluid coming from the ear. Ear pain or an itchy ear. Ringing in the ear. Coughing. Balance problems. An obvious piece of earwax that can be seen inside the ear canal. How is this diagnosed? This condition may be diagnosed based on: Your symptoms. Your medical history. An ear exam. During the exam, your health care provider will look into your ear with an instrument called an otoscope. You may have tests, including a hearing test. How is this treated? This condition may be treated by: Using ear drops to soften the earwax. Having the earwax removed by a health care provider. The health care provider may: Flush the ear with water. Use an instrument that has a loop on the end (curette). Use a suction device. Having surgery to remove the wax buildup. This may be done in severe cases. Follow these instructions at home:  Take over-the-counter and prescription medicines only as told by your health care provider. Do not put any objects, including cotton swabs, into your ear. You can clean the opening of your ear canal with a washcloth or facial tissue. Follow instructions from your health care provider about cleaning your ears. Do not overclean your ears. Drink enough fluid to keep your urine pale yellow. This will help to thin the earwax. Keep all follow-up visits as told. If earwax builds up in your ears often or if you use hearing aids, consider seeing your health care provider for routine, preventive ear cleanings. Ask  your health care provider how often you should schedule your cleanings. If you have hearing aids, clean them according to instructions from the manufacturer and your health care provider. Contact a health care provider if: You have ear pain. You develop a fever. You have pus or other fluid coming from your ear. You have hearing loss. You have ringing in  your ears that does not go away. You feel like the room is spinning (vertigo). Your symptoms do not improve with treatment. Get help right away if: You have bleeding from the affected ear. You have severe ear pain. Summary Earwax can build up in the ear and cause discomfort or hearing loss. The most common symptoms of this condition include reduced or muffled hearing, a feeling of fullness in the ear, or feeling that the ear is plugged. This condition may be diagnosed based on your symptoms, your medical history, and an ear exam. This condition may be treated by using ear drops to soften the earwax or by having the earwax removed by a health care provider. Do not put any objects, including cotton swabs, into your ear. You can clean the opening of your ear canal with a washcloth or facial tissue. This information is not intended to replace advice given to you by your health care provider. Make sure you discuss any questions you have with your health care provider. Document Revised: 06/01/2019 Document Reviewed: 06/01/2019 Elsevier Patient Education  2023 [employer].

## 2022-01-08 NOTE — Assessment & Plan Note (Signed)
BP adequately controlled. Continue Amlodipine 5 mg daily and low salt diet. She has her BP check q 3-4 months during appts with her rheumatologist. Eye exam is current.

## 2022-01-08 NOTE — Assessment & Plan Note (Signed)
No known hx of CVD, explained that RA can be a risk factor. ASCVD 2.1% I do not think pharmacologic treatment is needed at this time. Continue non pharmacologic treatment and FLP can be re-checked next year in 09/2022.

## 2022-02-10 ENCOUNTER — Other Ambulatory Visit: Payer: Self-pay | Admitting: Physician Assistant

## 2022-02-10 DIAGNOSIS — M0579 Rheumatoid arthritis with rheumatoid factor of multiple sites without organ or systems involvement: Secondary | ICD-10-CM

## 2022-02-11 ENCOUNTER — Encounter: Payer: Self-pay | Admitting: *Deleted

## 2022-02-11 NOTE — Telephone Encounter (Signed)
Next Visit: 04/02/2022  Last Visit: 10/18/2021  Last Fill: 10/08/2021  WP:[id number] arthritis involving multiple sites with positive rheumatoid factor   Current Dose per office note 10/18/2021: Rinvoq 15 mg 1 tablet by mouth daily.   Labs: 09/20/2021 White cell count is low most likely due to the use of Rinvoq .  CMP is normal.   TB Gold: 05/18/2021 Neg    Patient advised she is due to update labs and states she will update them this week.   Okay to refill Rinvoq?

## 2022-02-13 ENCOUNTER — Other Ambulatory Visit: Payer: Self-pay | Admitting: *Deleted

## 2022-02-13 DIAGNOSIS — Z79899 Other long term (current) drug therapy: Secondary | ICD-10-CM

## 2022-02-14 LAB — CBC WITH DIFFERENTIAL/PLATELET
Absolute Monocytes: 313 cells/uL (ref 200–950)
Basophils Absolute: 20 cells/uL (ref 0–200)
Basophils Relative: 0.6 %
Eosinophils Absolute: 20 cells/uL (ref 15–500)
Eosinophils Relative: 0.6 %
HCT: 41.1 % (ref 35.0–45.0)
Hemoglobin: 14 g/dL (ref 11.7–15.5)
Lymphs Abs: 911 cells/uL (ref 850–3900)
MCH: 31.5 pg (ref 27.0–33.0)
MCHC: 34.1 g/dL (ref 32.0–36.0)
MCV: 92.6 fL (ref 80.0–100.0)
MPV: 10.5 fL (ref 7.5–12.5)
Monocytes Relative: 9.2 %
Neutro Abs: 2135 cells/uL (ref 1500–7800)
Neutrophils Relative %: 62.8 %
Platelets: 334 10*3/uL (ref 140–400)
RBC: 4.44 10*6/uL (ref 3.80–5.10)
RDW: 12.6 % (ref 11.0–15.0)
Total Lymphocyte: 26.8 %
WBC: 3.4 10*3/uL — ABNORMAL LOW (ref 3.8–10.8)

## 2022-02-14 LAB — COMPLETE METABOLIC PANEL WITH GFR
AG Ratio: 1.6 (calc) (ref 1.0–2.5)
ALT: 14 U/L (ref 6–29)
AST: 22 U/L (ref 10–35)
Albumin: 4.6 g/dL (ref 3.6–5.1)
Alkaline phosphatase (APISO): 64 U/L (ref 37–153)
BUN: 12 mg/dL (ref 7–25)
CO2: 29 mmol/L (ref 20–32)
Calcium: 9.9 mg/dL (ref 8.6–10.4)
Chloride: 106 mmol/L (ref 98–110)
Creat: 0.59 mg/dL (ref 0.50–1.03)
Globulin: 2.9 g/dL (calc) (ref 1.9–3.7)
Glucose, Bld: 90 mg/dL (ref 65–99)
Potassium: 4.5 mmol/L (ref 3.5–5.3)
Sodium: 142 mmol/L (ref 135–146)
Total Bilirubin: 0.8 mg/dL (ref 0.2–1.2)
Total Protein: 7.5 g/dL (ref 6.1–8.1)
eGFR: 105 mL/min/{1.73_m2} (ref >=60)

## 2022-02-14 NOTE — Progress Notes (Signed)
White cell count is normal and stable.  CMP is normal.

## 2022-02-27 ENCOUNTER — Encounter: Payer: Self-pay | Admitting: Family Medicine

## 2022-02-27 ENCOUNTER — Other Ambulatory Visit: Payer: Self-pay | Admitting: Family Medicine

## 2022-02-27 DIAGNOSIS — Z1231 Encounter for screening mammogram for malignant neoplasm of breast: Secondary | ICD-10-CM

## 2022-03-06 ENCOUNTER — Other Ambulatory Visit: Payer: Self-pay | Admitting: Physician Assistant

## 2022-03-06 DIAGNOSIS — M0579 Rheumatoid arthritis with rheumatoid factor of multiple sites without organ or systems involvement: Secondary | ICD-10-CM

## 2022-03-06 NOTE — Telephone Encounter (Signed)
Next Visit: 04/02/2022   Last Visit: 10/18/2021   Last Fill: 02/11/2022 (30 day supply)   EY:[id number] arthritis involving multiple sites with positive rheumatoid factor    Current Dose per office note 10/18/2021: Rinvoq 15 mg 1 tablet by mouth daily.    Labs: 02/13/2022 White cell count is normal and stable.  CMP is normal.    TB Gold: 05/18/2021 Neg     Okay to refill Rinvoq?

## 2022-03-18 ENCOUNTER — Telehealth: Payer: 59 | Admitting: Physician Assistant

## 2022-03-18 DIAGNOSIS — B9689 Other specified bacterial agents as the cause of diseases classified elsewhere: Secondary | ICD-10-CM

## 2022-03-18 DIAGNOSIS — H109 Unspecified conjunctivitis: Secondary | ICD-10-CM

## 2022-03-18 DIAGNOSIS — J069 Acute upper respiratory infection, unspecified: Secondary | ICD-10-CM | POA: Diagnosis not present

## 2022-03-18 MED ORDER — AZITHROMYCIN 250 MG PO TABS: ORAL_TABLET | ORAL | 0 refills | Status: AC

## 2022-03-18 MED ORDER — POLYMYXIN B-TRIMETHOPRIM 10000-0.1 UNIT/ML-% OP SOLN: 1.0000 [drp] | OPHTHALMIC | 0 refills | Status: DC

## 2022-03-18 NOTE — Progress Notes (Signed)
Virtual Visit Consent   Vickie [name], you are scheduled for a virtual visit with a [facility] provider today. Just as with appointments in the office, your consent must be obtained to participate. Your consent will be active for this visit and any virtual visit you may have with one of our providers in the next 365 days. If you have a MyChart account, a copy of this consent can be sent to you electronically.  As this is a virtual visit, video technology does not allow for your provider to perform a traditional examination. This may limit your provider's ability to fully assess your condition. If your provider identifies any concerns that need to be evaluated in person or the need to arrange testing (such as labs, EKG, etc.), we will make arrangements to do so. Although advances in technology are sophisticated, we cannot ensure that it will always work on either your end or our end. If the connection with a video visit is poor, the visit may have to be switched to a telephone visit. With either a video or telephone visit, we are not always able to ensure that we have a secure connection.  By engaging in this virtual visit, you consent to the provision of healthcare and authorize for your insurance to be billed (if applicable) for the services provided during this visit. Depending on your insurance coverage, you may receive a charge related to this service.  I need to obtain your verbal consent now. Are you willing to proceed with your visit today? Vickie [name] has provided verbal consent on 03/18/2022 for a virtual visit (video or telephone). Mar Daring, PA-C  Date: 03/18/2022 9:04 AM  Virtual Visit via Video Note   I, Mar Daring, connected with  Vickie [name]  ([id number], Nov 27, 1965) on 03/18/22 at  9:00 AM EST by a video-enabled telemedicine application and verified that I am speaking with the correct person using two identifiers.  Location: Patient: Virtual Visit Location  Patient: Home Provider: Virtual Visit Location Provider: Home Office   I discussed the limitations of evaluation and management by telemedicine and the availability of in person appointments. The patient expressed understanding and agreed to proceed.    History of Present Illness: Vickie [name] is a 57 y.o. who identifies as a female who was assigned female at birth, and is being seen today for possible pink eye.  HPI: Conjunctivitis  The current episode started yesterday. The onset was sudden. The problem occurs rarely. The problem has been gradually worsening. The problem is mild. Associated symptoms include congestion, headaches, rhinorrhea, cough, URI (started over 2 weeks ago) and eye redness. Pertinent negatives include no fever, no decreased vision, no double vision, no eye itching, no photophobia, no ear pain, no sore throat, no eye discharge and no eye pain. The eye pain is mild. The left eye is affected. The eye pain is not associated with movement. The eyelid exhibits no abnormality.     Problems:  Patient Active Problem List   Diagnosis Date Noted   Hyperlipidemia 01/08/2022   Anxiety 10/12/2020   PVC (premature ventricular contraction) 01/02/2017   Snoring 01/02/2017   High risk medication use 10/24/2016   Rheumatoid arthritis ([place]) 08/07/2016   Hypertension, essential 08/07/2016    Allergies:  Allergies  Allergen Reactions   Latex    Sulfa Antibiotics    Medications:  Current Outpatient Medications:    amLODipine (NORVASC) 5 MG tablet, TAKE 1 TABLET BY MOUTH  DAILY, Disp: 90  tablet, Rfl: 3   azithromycin (ZITHROMAX) 250 MG tablet, Take 2 tablets on day 1, then 1 tablet daily on days 2 through 5, Disp: 6 tablet, Rfl: 0   famotidine (PEPCID) 40 MG tablet, Take 1 tablet (40 mg total) by mouth daily., Disp: 30 tablet, Rfl: 2   folic acid (FOLVITE) 1 MG tablet, Take 2 tablets (2 mg total) by mouth daily., Disp: 180 tablet, Rfl: 0   RINVOQ 15 MG TB24, TAKE 1 TABLET BY MOUTH  DAILY, Disp: 90 tablet, Rfl: 0   trimethoprim-polymyxin b (POLYTRIM) ophthalmic solution, Place 1 drop into the left eye every 4 (four) hours. X 5 days, Disp: 10 mL, Rfl: 0  Observations/Objective: Patient is well-developed, well-nourished in no acute distress.  Resting comfortably at home.  Head is normocephalic, atraumatic.  No labored breathing.  Speech is clear and coherent with logical content.  Patient is alert and oriented at baseline.  Left eye is injected in the lacrimal corner  Assessment and Plan: 1. Bacterial conjunctivitis of left eye - trimethoprim-polymyxin b (POLYTRIM) ophthalmic solution; Place 1 drop into the left eye every 4 (four) hours. X 5 days  Dispense: 10 mL; Refill: 0  2. Bacterial upper respiratory infection - azithromycin (ZITHROMAX) 250 MG tablet; Take 2 tablets on day 1, then 1 tablet daily on days 2 through 5  Dispense: 6 tablet; Refill: 0  - Suspect bacterial conjunctivitis - Polytrim prescribed - Warm compresses - Good hand hygiene  - Worsening over a week despite OTC medications - Will treat with Z-pack  - Can continue Mucinex  - Push fluids.  - Rest.  - Steam and humidifier can help  - Seek in person evaluation if symptoms worsen or fail to improve   Follow Up Instructions: I discussed the assessment and treatment plan with the patient. The patient was provided an opportunity to ask questions and all were answered. The patient agreed with the plan and demonstrated an understanding of the instructions.  A copy of instructions were sent to the patient via MyChart unless otherwise noted below.    The patient was advised to call back or seek an in-person evaluation if the symptoms worsen or if the condition fails to improve as anticipated.  Time:  I spent 12 minutes with the patient via telehealth technology discussing the above problems/concerns.    Mar Daring, PA-C

## 2022-03-18 NOTE — Patient Instructions (Signed)
Vickie [name], thank you for joining Mar Daring, PA-C for today's virtual visit.  While this provider is not your primary care provider (PCP), if your PCP is located in our provider database this encounter information will be shared with them immediately following your visit.   [facility] account gives you access to today's visit and all your visits, tests, and labs performed at [facility] " click here if you don't have a [place] account or go to [email or web address]  Consent: (Patient) Vickie [name] provided verbal consent for this virtual visit at the beginning of the encounter.  Current Medications:  Current Outpatient Medications:    amLODipine (NORVASC) 5 MG tablet, TAKE 1 TABLET BY MOUTH  DAILY, Disp: 90 tablet, Rfl: 3   azithromycin (ZITHROMAX) 250 MG tablet, Take 2 tablets on day 1, then 1 tablet daily on days 2 through 5, Disp: 6 tablet, Rfl: 0   famotidine (PEPCID) 40 MG tablet, Take 1 tablet (40 mg total) by mouth daily., Disp: 30 tablet, Rfl: 2   folic acid (FOLVITE) 1 MG tablet, Take 2 tablets (2 mg total) by mouth daily., Disp: 180 tablet, Rfl: 0   RINVOQ 15 MG TB24, TAKE 1 TABLET BY MOUTH DAILY, Disp: 90 tablet, Rfl: 0   trimethoprim-polymyxin b (POLYTRIM) ophthalmic solution, Place 1 drop into the left eye every 4 (four) hours. X 5 days, Disp: 10 mL, Rfl: 0   Medications ordered in this encounter:  Meds ordered this encounter  Medications   trimethoprim-polymyxin b (POLYTRIM) ophthalmic solution    Sig: Place 1 drop into the left eye every 4 (four) hours. X 5 days    Dispense:  10 mL    Refill:  0    Order Specific Question:   Supervising Provider    Answer:   Chase Picket [[id number]]   azithromycin (ZITHROMAX) 250 MG tablet    Sig: Take 2 tablets on day 1, then 1 tablet daily on days 2 through 5    Dispense:  6 tablet    Refill:  0    Order Specific Question:   Supervising Provider    Answer:   Chase Picket  [[id number]]     *If you need refills on other medications prior to your next appointment, please contact your pharmacy*  Follow-Up: Call back or seek an in-person evaluation if the symptoms worsen or if the condition fails to improve as anticipated.  [place] [phone]  Other Instructions  Bacterial Conjunctivitis, Adult Bacterial conjunctivitis is an infection of the clear membrane that covers the white part of the eye and the inner surface of the eyelid (conjunctiva). When the blood vessels in the conjunctiva become inflamed, the eye becomes red or pink. The eye often feels irritated or itchy. Bacterial conjunctivitis spreads easily from person to person (is contagious). It also spreads easily from one eye to the other eye. What are the causes? This condition is caused by bacteria. You may get the infection if you come into close contact with: A person who is infected with the bacteria. Items that are contaminated with the bacteria, such as a face towel, contact lens solution, or eye makeup. What increases the risk? You are more likely to develop this condition if: You are exposed to other people who have the infection. You wear contact lenses. You have a sinus infection. You have had a recent eye injury or surgery. You have a weak body defense system (immune system).  You have a medical condition that causes dry eyes. What are the signs or symptoms? Symptoms of this condition include: Thick, yellowish discharge from the eye. This may turn into a crust on the eyelid overnight and cause your eyelids to stick together. Tearing or watery eyes. Itchy eyes. Burning feeling in your eyes. Eye redness. Swollen eyelids. Blurred vision. How is this diagnosed? This condition is diagnosed based on your symptoms and medical history. Your health care provider may also take a sample of discharge from your eye to find the cause of your infection. How is this treated? This  condition may be treated with: Antibiotic eye drops or ointment to clear the infection more quickly and prevent the spread of infection to others. Antibiotic medicines taken by mouth (orally) to treat infections that do not respond to drops or ointments or that last longer than 10 days. Cool, wet cloths (cool compresses) placed on the eyes. Artificial tears applied 2-6 times a day. Follow these instructions at home: Medicines Take or apply your antibiotic medicine as told by your health care provider. Do not stop using the antibiotic, even if your condition improves, unless directed by your health care provider. Take or apply over-the-counter and prescription medicines only as told by your health care provider. Be very careful to avoid touching the edge of your eyelid with the eye-drop bottle or the ointment tube when you apply medicines to the affected eye. This will keep you from spreading the infection to your other eye or to other people. Managing discomfort Gently wipe away any drainage from your eye with a warm, wet washcloth or a cotton ball. Apply a clean, cool compress to your eye for 10-20 minutes, 3-4 times a day. General instructions Do not wear contact lenses until the inflammation is gone and your health care provider says it is safe to wear them again. Ask your health care provider how to sterilize or replace your contact lenses before you use them again. Wear glasses until you can resume wearing contact lenses. Avoid wearing eye makeup until the inflammation is gone. Throw away any old eye cosmetics that may be contaminated. Change or wash your pillowcase every day. Do not share towels or washcloths. This may spread the infection. Wash your hands often with soap and water for at least 20 seconds and especially before touching your face or eyes. Use paper towels to dry your hands. Avoid touching or rubbing your eyes. Do not drive or use heavy machinery if your vision is  blurred. Contact a health care provider if: You have a fever. Your symptoms do not get better after 10 days. Get help right away if: You have a fever and your symptoms suddenly get worse. You have severe pain when you move your eye. You have facial pain, redness, or swelling. You have a sudden loss of vision. Summary Bacterial conjunctivitis is an infection of the clear membrane that covers the white part of the eye and the inner surface of the eyelid (conjunctiva). Bacterial conjunctivitis spreads easily from eye to eye and from person to person (is contagious). Wash your hands often with soap and water for at least 20 seconds and especially before touching your face or eyes. Use paper towels to dry your hands. Take or apply your antibiotic medicine as told by your health care provider. Do not stop using the antibiotic even if your condition improves. Contact a health care provider if you have a fever or if your symptoms do not get better after  10 days. Get help right away if you have a sudden loss of vision. This information is not intended to replace advice given to you by your health care provider. Make sure you discuss any questions you have with your health care provider. Document Revised: 05/24/2020 Document Reviewed: 05/24/2020 Elsevier Patient Education  2023 Elsevier Inc.   Upper Respiratory Infection, Adult An upper respiratory infection (URI) is a common viral infection of the nose, throat, and upper air passages that lead to the lungs. The most common type of URI is the common cold. URIs usually get better on their own, without medical treatment. What are the causes? A URI is caused by a virus. You may catch a virus by: Breathing in droplets from an infected person's cough or sneeze. Touching something that has been exposed to the virus (is contaminated) and then touching your mouth, nose, or eyes. What increases the risk? You are more likely to get a URI if: You are very  young or very old. You have close contact with others, such as at work, school, or a health care facility. You smoke. You have long-term (chronic) heart or lung disease. You have a weakened disease-fighting system (immune system). You have nasal allergies or asthma. You are experiencing a lot of stress. You have poor nutrition. What are the signs or symptoms? A URI usually involves some of the following symptoms: Runny or stuffy (congested) nose. Cough. Sneezing. Sore throat. Headache. Fatigue. Fever. Loss of appetite. Pain in your forehead, behind your eyes, and over your cheekbones (sinus pain). Muscle aches. Redness or irritation of the eyes. Pressure in the ears or face. How is this diagnosed? This condition may be diagnosed based on your medical history and symptoms, and a physical exam. Your health care provider may use a swab to take a mucus sample from your nose (nasal swab). This sample can be tested to determine what virus is causing the illness. How is this treated? URIs usually get better on their own within 7-10 days. Medicines cannot cure URIs, but your health care provider may recommend certain medicines to help relieve symptoms, such as: Over-the-counter cold medicines. Cough suppressants. Coughing is a type of defense against infection that helps to clear the respiratory system, so take these medicines only as recommended by your health care provider. Fever-reducing medicines. Follow these instructions at home: Activity Rest as needed. If you have a fever, stay home from work or school until your fever is gone or until your health care provider says your URI cannot spread to other people (is no longer contagious). Your health care provider may have you wear a face mask to prevent your infection from spreading. Relieving symptoms Gargle with a mixture of salt and water 3-4 times a day or as needed. To make salt water, completely dissolve -1 tsp (3-6 g) of salt in 1  cup (237 mL) of warm water. Use a cool-mist humidifier to add moisture to the air. This can help you breathe more easily. Eating and drinking  Drink enough fluid to keep your urine pale yellow. Eat soups and other clear broths. General instructions  Take over-the-counter and prescription medicines only as told by your health care provider. These include cold medicines, fever reducers, and cough suppressants. Do not use any products that contain nicotine or tobacco. These products include cigarettes, chewing tobacco, and vaping devices, such as e-cigarettes. If you need help quitting, ask your health care provider. Stay away from secondhand smoke. Stay up to date on all  immunizations, including the yearly (annual) flu vaccine. Keep all follow-up visits. This is important. How to prevent the spread of infection to others URIs can be contagious. To prevent the infection from spreading: Wash your hands with soap and water for at least 20 seconds. If soap and water are not available, use hand sanitizer. Avoid touching your mouth, face, eyes, or nose. Cough or sneeze into a tissue or your sleeve or elbow instead of into your hand or into the air.  Contact a health care provider if: You are getting worse instead of better. You have a fever or chills. Your mucus is brown or red. You have yellow or brown discharge coming from your nose. You have pain in your face, especially when you bend forward. You have swollen neck glands. You have pain while swallowing. You have white areas in the back of your throat. Get help right away if: You have shortness of breath that gets worse. You have severe or persistent: Headache. Ear pain. Sinus pain. Chest pain. You have chronic lung disease along with any of the following: Making high-pitched whistling sounds when you breathe, most often when you breathe out (wheezing). Prolonged cough (more than 14 days). Coughing up blood. A change in your usual  mucus. You have a stiff neck. You have changes in your: Vision. Hearing. Thinking. Mood. These symptoms may be an emergency. Get help right away. Call 911. Do not wait to see if the symptoms will go away. Do not drive yourself to the hospital. Summary An upper respiratory infection (URI) is a common infection of the nose, throat, and upper air passages that lead to the lungs. A URI is caused by a virus. URIs usually get better on their own within 7-10 days. Medicines cannot cure URIs, but your health care provider may recommend certain medicines to help relieve symptoms. This information is not intended to replace advice given to you by your health care provider. Make sure you discuss any questions you have with your health care provider. Document Revised: 09/13/2020 Document Reviewed: 09/13/2020 Elsevier Patient Education  [place].    If you have been instructed to have an in-person evaluation today at a local Urgent Care facility, please use the link below. It will take you to a list of all of our available [facility], including address, phone number and hours of operation. Please do not delay care.  [facility]  If you or a family member do not have a primary care provider, use the link below to schedule a visit and establish care. When you choose a [facility] primary care physician or advanced practice provider, you gain a long-term partner in health. Find a Primary Care Provider  Learn more about [facility] [place]'s in-office and virtual care options: [facility]

## 2022-03-19 NOTE — Progress Notes (Unsigned)
Office Visit Note  Patient: Vickie [name]             Date of Birth: Dec 27, 1965           MRN: [id number]             PCP: Martinique, Betty G, MD Referring: Martinique, Betty G, MD Visit Date: 04/02/2022 Occupation: @GUAROCC @  Subjective:  Medication monitoring  History of Present Illness: Vickie [name] is a 57 y.o. female with history of seropositive rheumatoid arthritis.  Patient is currently taking Rinvoq 15 mg 1 tablet by mouth daily.  She is tolerating Rinvoq without any side effects and has not missed any doses recently.  She denies any signs or symptoms of a rheumatoid arthritis flare.  She has not been experiencing any morning stiffness or nocturnal pain.  She has been trying to remain active going to the gym on a regular basis for exercise.  She denies any recent or recurrent infections.  She denies any new medical conditions.  She requested a refill of her invoke to be sent to the pharmacy.    Activities of Daily Living:  Patient reports morning stiffness for 0 minutes  Patient Denies nocturnal pain.  Difficulty dressing/grooming: Denies Difficulty climbing stairs: Denies Difficulty getting out of chair: Denies Difficulty using hands for taps, buttons, cutlery, and/or writing: Denies  Review of Systems  Constitutional: Negative.  Negative for fatigue.  HENT: Negative.  Negative for mouth sores and mouth dryness.   Eyes: Negative.  Negative for dryness.  Respiratory: Negative.  Negative for shortness of breath.   Cardiovascular: Negative.  Negative for chest pain and palpitations.  Gastrointestinal: Negative.  Negative for blood in stool, constipation and diarrhea.  Endocrine: Negative.  Negative for increased urination.  Genitourinary: Negative.  Negative for involuntary urination.  Musculoskeletal: Negative.  Negative for joint pain, gait problem, joint pain, joint swelling, myalgias, muscle weakness, morning stiffness, muscle tenderness and myalgias.  Skin: Negative.   Negative for color change, rash, hair loss and sensitivity to sunlight.  Allergic/Immunologic: Negative.  Negative for susceptible to infections.  Neurological: Negative.  Negative for dizziness and headaches.  Hematological: Negative.  Negative for swollen glands.  Psychiatric/Behavioral: Negative.  Negative for depressed mood and sleep disturbance. The patient is not nervous/anxious.     PMFS History:  Patient Active Problem List   Diagnosis Date Noted   Routine general medical examination at a health care facility 03/20/2022   Subconjunctival hemorrhage of left eye 03/20/2022   Hyperlipidemia 01/08/2022   Anxiety 10/12/2020   PVC (premature ventricular contraction) 01/02/2017   Snoring 01/02/2017   High risk medication use 10/24/2016   Rheumatoid arthritis ([place]) 08/07/2016   Hypertension, essential 08/07/2016    Past Medical History:  Diagnosis Date   Anxiety 10/12/2020   Arthritis    RA Dx at age 1   Bell's palsy    Chicken pox    Hypertension    PVC (premature ventricular contraction) 01/02/2017   Rheumatoid arthritis ([place])    Snoring 01/02/2017    Family History  Problem Relation Age of Onset   Rheum arthritis Mother    Diabetes Neg Hx    Hypertension Neg Hx    Hyperlipidemia Neg Hx    Stroke Neg Hx    Colon cancer Neg Hx    Past Surgical History:  Procedure Laterality Date   BREAST BIOPSY Left    unsure of time  - ?2016 in [place]  FOOT SURGERY Left 04/2019   Social History   Social History Narrative   Not on file   Immunization History  Administered Date(s) Administered   Influenza Inj Mdck Quad Pf 11/28/2016   Influenza Split 12/17/2019   Influenza,inj,Quad PF,6+ Mos 12/05/2015, 12/19/2017, 11/12/2018   Influenza-Unspecified 12/06/2020   PFIZER Comirnaty(Gray Top)Covid-19 Tri-Sucrose Vaccine 05/31/2020   PFIZER(Purple Top)SARS-COV-2 Vaccination 05/10/2019, 06/07/2019, 11/12/2019   Tdap 09/26/2014   Zoster Recombinat (Shingrix)  11/12/2018, 03/25/2019     Objective: Vital Signs: BP 120/83 (BP Location: Left Arm, Patient Position: Sitting, Cuff Size: Normal)   Pulse 76   Ht 5\' 1"  (1.549 m)   Wt 137 lb 12.8 oz (62.5 kg)   BMI 26.04 kg/m    Physical Exam Vitals and nursing note reviewed.  Constitutional:      Appearance: She is well-developed.  HENT:     Head: Normocephalic and atraumatic.  Eyes:     Conjunctiva/sclera: Conjunctivae normal.  Cardiovascular:     Rate and Rhythm: Normal rate and regular rhythm.     Heart sounds: Normal heart sounds.  Pulmonary:     [place]: Pulmonary effort is normal.     Breath sounds: Normal breath sounds.  Abdominal:     General: Bowel sounds are normal.     Palpations: Abdomen is soft.  Musculoskeletal:     Cervical back: Normal range of motion.  Skin:    General: Skin is warm and dry.     Capillary Refill: Capillary refill takes less than 2 seconds.  Neurological:     Mental Status: She is alert and oriented to person, place, and time.  Psychiatric:        Behavior: Behavior normal.      Musculoskeletal Exam: C-spine has good range of motion with no discomfort.  Shoulder joints and elbow joints have good range of motion.  Limited range of motion with synovial thickening of both wrist joints.  Ulnar deviation and synovial thickening of MCP joints but no tenderness or active synovitis.  Hip joints have good range of motion with no groin pain.  No tenderness over trochanteric bursa bilaterally.  Knee joints have good range of motion with no warmth or effusion.  Ankle joints have good range of motion with no tenderness or synovitis.  Postsurgical changes noted in both feet.  No tenderness or synovitis over MTP joints.  CDAI Exam: CDAI Score: -- Patient Global: 1 mm; Provider Global: 1 mm Swollen: --; Tender: -- Joint Exam 04/02/2022   No joint exam has been documented for this visit   There is currently no information documented on the homunculus. Go to the  Rheumatology activity and complete the homunculus joint exam.  Investigation: No additional findings.  Imaging: No results found.  Recent Labs: Lab Results  Component Value Date   WBC 3.4 (L) 02/13/2022   HGB 14.0 02/13/2022   PLT 334 02/13/2022   NA 139 03/20/2022   K 3.8 03/20/2022   CL 103 03/20/2022   CO2 30 03/20/2022   GLUCOSE 100 (H) 03/20/2022   BUN 13 03/20/2022   CREATININE 0.55 03/20/2022   BILITOT 0.8 02/13/2022   ALKPHOS 79 01/02/2017   AST 22 02/13/2022   ALT 14 02/13/2022   PROT 7.5 02/13/2022   ALBUMIN 4.3 01/02/2017   CALCIUM 9.7 03/20/2022   GFRAA 116 08/25/2020   QFTBGOLDPLUS NEGATIVE 05/18/2021    Speciality Comments: Rinvoq September 2021, inadequate response to methotrexate and Humira  Procedures:  No procedures performed Allergies: Latex and Sulfa  antibiotics   Assessment / Plan:     Visit Diagnoses: Rheumatoid arthritis involving multiple sites with positive rheumatoid factor (HCC) - Positive RF, positive anti-CCP, elevated ESR, erosive disease with nodulosis: She has no joint tenderness or synovitis on examination today.  She has not had any signs or symptoms of a rheumatoid arthritis flare.  She has clinically been doing well taking Rinvoq 15 mg 1 tablet by mouth daily.  She has been tolerating Rinvoq without any side effects and has not missed any doses recently.  She has not had any recent or recurrent infections.  No new medical conditions.  She has not been experiencing any morning stiffness or nocturnal pain.  She has not had any difficulty with ADLs.  She has been trying to remain active exercising in the gym on a regular basis.  She will remain on Rinvoq as monotherapy.  She was advised to notify us if she develops signs or symptoms of a flare.  She will follow-up in the office in 5 months or sooner if needed.- Plan: Upadacitinib ER (RINVOQ) 15 MG TB24  High risk medication use - Rinvoq 15 mg 1 tablet by mouth daily. D/c Rasuvo due to  clinically doing well. - Plan: QuantiFERON-TB Gold Plus CBC and CMP were drawn on 02/13/2022.  Her next lab work will be due in March and every 3 months to monitor for drug toxicity. Lipid panel updated on 10/18/2021. TB Gold negative on 05/18/21.  Future order for TB Gold placed today. No recent or recurrent infections.  Discussed the importance of holding Rinvoq if she develops signs or symptoms of an infection and to resume once the infection is completely cleared. Counseled on the increase risk of venous thrombosis. Counseled about FDA black box warning of MACE (major adverse CV events including cardiovascular death, myocardial infarction, and stroke).    Screening for tuberculosis -Future order for TB gold placed today.  Plan: QuantiFERON-TB Gold Plus  Rheumatoid nodule, right elbow (HCC) - Resolved.  Rheumatoid nodule, left elbow (HCC) - Resolved.  S/P bunionectomy - She had a bunionectomy by Dr. Victorino Dike in 2021.  No tenderness or inflammation.    Calcaneal spur of both feet: She is not experiencing any discomfort in her feet at this time.   Chronic midline low back pain without sciatica: She continues to experience intermittent discomfort in her lower back.  She has no symptoms of sciatica at this time.  Elevated cholesterol: Total cholesterol was 226 and triglycerides were 184 on 03/20/2021.  LDL was 123 at that time. The 10-year ASCVD risk score (Arnett DK, et al., 2019) is: 3%   Other medical conditions are listed as follows:  History of hypertension: BP was 120/83 today in the office.   Anxiety   Orders: Orders Placed This Encounter  Procedures   QuantiFERON-TB Gold Plus   Meds ordered this encounter  Medications   DISCONTD: Upadacitinib ER (RINVOQ) 15 MG TB24    Sig: Take 1 tablet (15 mg total) by mouth daily.    Dispense:  90 tablet    Refill:  0   Upadacitinib ER (RINVOQ) 15 MG TB24    Sig: Take 1 tablet (15 mg total) by mouth daily.    Dispense:  90 tablet     Refill:  0    Follow-Up Instructions: Return in about 5 months (around 08/31/2022) for Rheumatoid arthritis.   Gearldine Bienenstock, PA-C  Note - This record has been created using Dragon software.  Chart creation errors have  been sought, but may not always  have been located. Such creation errors do not reflect on  the standard of medical care.

## 2022-03-19 NOTE — Progress Notes (Unsigned)
HPI: [name] is a 57 y.o. female, who is here today for her routine physical.  Last CPE: 2021  Regular exercise 3 or more time per week: *** Following a healthy diet: ***  Chronic medical problems: ***  Immunization History  Administered Date(s) Administered   Influenza Inj Mdck Quad Pf 11/28/2016   Influenza Split 12/17/2019   Influenza,inj,Quad PF,6+ Mos 12/05/2015, 12/19/2017, 11/12/2018   Influenza-Unspecified 12/06/2020   PFIZER Comirnaty(Gray Top)Covid-19 Tri-Sucrose Vaccine 05/31/2020   PFIZER(Purple Top)SARS-COV-2 Vaccination 05/10/2019, 06/07/2019, 11/12/2019   Tdap 09/26/2014   Zoster Recombinat (Shingrix) 11/12/2018, 03/25/2019   Health Maintenance  Topic Date Due   COVID-19 Vaccine (5 - 2023-24 season) 10/26/2021   INFLUENZA VACCINE  05/26/2023 (Originally 09/25/2021)   PAP SMEAR-Modifier  12/27/2022   MAMMOGRAM  03/07/2023   DTaP/Tdap/Td (2 - Td or Tdap) 09/25/2024   COLONOSCOPY (Pts 45-63yrs Insurance coverage will need to be confirmed)  04/05/2027   Hepatitis C Screening  Completed   HIV Screening  Completed   Zoster Vaccines- Shingrix  Completed   HPV VACCINES  Aged Out    She has *** concerns today.  Review of Systems  Current Outpatient Medications on File Prior to Visit  Medication Sig Dispense Refill   amLODipine (NORVASC) 5 MG tablet TAKE 1 TABLET BY MOUTH  DAILY 90 tablet 3   azithromycin (ZITHROMAX) 250 MG tablet Take 2 tablets on day 1, then 1 tablet daily on days 2 through 5 6 tablet 0   famotidine (PEPCID) 40 MG tablet Take 1 tablet (40 mg total) by mouth daily. 30 tablet 2   folic acid (FOLVITE) 1 MG tablet Take 2 tablets (2 mg total) by mouth daily. 180 tablet 0   RINVOQ 15 MG TB24 TAKE 1 TABLET BY MOUTH DAILY 90 tablet 0   trimethoprim-polymyxin b (POLYTRIM) ophthalmic solution Place 1 drop into the left eye every 4 (four) hours. X 5 days 10 mL 0   No current facility-administered medications on file prior to visit.    Past  Medical History:  Diagnosis Date   Anxiety 10/12/2020   Arthritis    RA Dx at age 30   Bell's palsy    Chicken pox    Hypertension    PVC (premature ventricular contraction) 01/02/2017   Rheumatoid arthritis ([place])    Snoring 01/02/2017    Past Surgical History:  Procedure Laterality Date   BREAST BIOPSY Left    unsure of time  - ?2016 in [place] Left 04/2019    Allergies  Allergen Reactions   Latex    Sulfa Antibiotics     Family History  Problem Relation Age of Onset   Rheum arthritis Mother    Diabetes Neg Hx    Hypertension Neg Hx    Hyperlipidemia Neg Hx    Stroke Neg Hx    Colon cancer Neg Hx     Social History   Socioeconomic History   Marital status: Married    Spouse name: Not on file   Number of children: Not on file   Years of education: Not on file   Highest education level: Bachelor's degree (e.g., BA, AB, BS)  Occupational History   Not on file  Tobacco Use   Smoking status: Never    Passive exposure: Never   Smokeless tobacco: Never  Vaping Use   Vaping Use: Never used  Substance and Sexual Activity   Alcohol use: Yes  Comment: occ   Drug use: No   Sexual activity: Not on file  Other Topics Concern   Not on file  Social History Narrative   Not on file   Social Determinants of Health   Financial Resource Strain: Low Risk  (01/05/2022)   Overall Financial Resource Strain (CARDIA)    Difficulty of Paying Living Expenses: Not very hard  Food Insecurity: No Food Insecurity (01/05/2022)   Hunger Vital Sign    Worried About Running Out of Food in the Last Year: Never true    Ran Out of Food in the Last Year: Never true  Transportation Needs: No Transportation Needs (01/05/2022)   PRAPARE - Hydrologist (Medical): No    Lack of Transportation (Non-Medical): No  Physical Activity: Insufficiently Active (01/05/2022)   Exercise Vital Sign    Days of Exercise per Week: 3 days     Minutes of Exercise per Session: 40 min  Stress: No Stress Concern Present (01/05/2022)   [place]    Feeling of Stress : Only a little  Social Connections: Socially Integrated (01/05/2022)   Social Connection and Isolation Panel [NHANES]    Frequency of Communication with Friends and Family: Twice a week    Frequency of Social Gatherings with Friends and Family: Twice a week    Attends Religious Services: 1 to 4 times per year    Active Member of [employer] or Organizations: Yes    Attends Archivist Meetings: 1 to 4 times per year    Marital Status: Married    There were no vitals filed for this visit. There is no height or weight on file to calculate BMI.  Wt Readings from Last 3 Encounters:  01/08/22 137 lb 4 oz (62.3 kg)  10/18/21 136 lb (61.7 kg)  05/18/21 139 lb 9.6 oz (63.3 kg)    Physical Exam  ASSESSMENT AND PLAN: Vickie [name] was here today annual physical examination.  No orders of the defined types were placed in this encounter.   There are no diagnoses linked to this encounter.  There are no diagnoses linked to this encounter.  No follow-ups on file.  Steven Veazie G. Martinique, MD  [facility]. [place] office.

## 2022-03-20 ENCOUNTER — Ambulatory Visit (INDEPENDENT_AMBULATORY_CARE_PROVIDER_SITE_OTHER): Payer: 59 | Admitting: Family Medicine

## 2022-03-20 ENCOUNTER — Encounter: Payer: Self-pay | Admitting: Family Medicine

## 2022-03-20 VITALS — BP 126/70 | HR 97 | Temp 98.4°F | Resp 16 | Ht 61.0 in | Wt 135.1 lb

## 2022-03-20 DIAGNOSIS — M0579 Rheumatoid arthritis with rheumatoid factor of multiple sites without organ or systems involvement: Secondary | ICD-10-CM

## 2022-03-20 DIAGNOSIS — Z130 Encounter for screening for diseases of the blood and blood-forming organs and certain disorders involving the immune mechanism: Secondary | ICD-10-CM

## 2022-03-20 DIAGNOSIS — Z0000 Encounter for general adult medical examination without abnormal findings: Secondary | ICD-10-CM

## 2022-03-20 DIAGNOSIS — Z13228 Encounter for screening for other metabolic disorders: Secondary | ICD-10-CM | POA: Diagnosis not present

## 2022-03-20 DIAGNOSIS — Z1329 Encounter for screening for other suspected endocrine disorder: Secondary | ICD-10-CM | POA: Diagnosis not present

## 2022-03-20 DIAGNOSIS — E785 Hyperlipidemia, unspecified: Secondary | ICD-10-CM | POA: Diagnosis not present

## 2022-03-20 DIAGNOSIS — I10 Essential (primary) hypertension: Secondary | ICD-10-CM | POA: Diagnosis not present

## 2022-03-20 DIAGNOSIS — H1132 Conjunctival hemorrhage, left eye: Secondary | ICD-10-CM

## 2022-03-20 LAB — BASIC METABOLIC PANEL
BUN: 13 mg/dL (ref 6–23)
CO2: 30 mEq/L (ref 19–32)
Calcium: 9.7 mg/dL (ref 8.4–10.5)
Chloride: 103 mEq/L (ref 96–112)
Creatinine, Ser: 0.55 mg/dL (ref 0.40–1.20)
GFR: 101.75 mL/min (ref >60.00)
Glucose, Bld: 100 mg/dL — ABNORMAL HIGH (ref 70–99)
Potassium: 3.8 mEq/L (ref 3.5–5.1)
Sodium: 139 mEq/L (ref 135–145)

## 2022-03-20 LAB — LIPID PANEL
Cholesterol: 226 mg/dL — ABNORMAL HIGH (ref 0–200)
HDL: 66.6 mg/dL (ref >39.00)
LDL Cholesterol: 123 mg/dL — ABNORMAL HIGH (ref 0–99)
NonHDL: 159.76
Total CHOL/HDL Ratio: 3
Triglycerides: 184 mg/dL — ABNORMAL HIGH (ref 0.0–149.0)
VLDL: 36.8 mg/dL (ref 0.0–40.0)

## 2022-03-20 LAB — HEMOGLOBIN A1C: Hgb A1c MFr Bld: 5.7 % (ref 4.6–6.5)

## 2022-03-20 NOTE — Assessment & Plan Note (Signed)
Problem is improving. Educated about diagnosis,prognosis, and treatment options.

## 2022-03-20 NOTE — Assessment & Plan Note (Signed)
Non pharmacologic treatment recommended for now. Further recommendations will be given according to 10 years CVD risk score and lipid panel numbers. 

## 2022-03-20 NOTE — Assessment & Plan Note (Signed)
Follows with rheumatology regularly.

## 2022-03-20 NOTE — Assessment & Plan Note (Signed)
BP adequately controlled. Continue Amlodipine 5 mg daily and low salt diet. Eye exam is current.

## 2022-03-20 NOTE — Patient Instructions (Addendum)
A few things to remember from today's visit:  Routine general medical examination at a health care facility  Hypertension, essential - Plan: Basic Metabolic Panel  Hyperlipidemia, unspecified hyperlipidemia type - Plan: Lipid panel  Screening for endocrine, metabolic and immunity disorder - Plan: Hemoglobin A1c  If you need refills for medications you take chronically, please call your pharmacy. Do not use My Chart to request refills or for acute issues that need immediate attention. If you send a my chart message, it may take a few days to be addressed, specially if I am not in the office.  Please be sure medication list is accurate. If a new problem present, please set up appointment sooner than planned today.  Health Maintenance, Female Adopting a healthy lifestyle and getting preventive care are important in promoting health and wellness. Ask your health care provider about: The right schedule for you to have regular tests and exams. Things you can do on your own to prevent diseases and keep yourself healthy. What should I know about diet, weight, and exercise? Eat a healthy diet  Eat a diet that includes plenty of vegetables, fruits, low-fat dairy products, and lean protein. Do not eat a lot of foods that are high in solid fats, added sugars, or sodium. Maintain a healthy weight Body mass index (BMI) is used to identify weight problems. It estimates body fat based on height and weight. Your health care provider can help determine your BMI and help you achieve or maintain a healthy weight. Get regular exercise Get regular exercise. This is one of the most important things you can do for your health. Most adults should: Exercise for at least 150 minutes each week. The exercise should increase your heart rate and make you sweat (moderate-intensity exercise). Do strengthening exercises at least twice a week. This is in addition to the moderate-intensity exercise. Spend less time  sitting. Even light physical activity can be beneficial. Watch cholesterol and blood lipids Have your blood tested for lipids and cholesterol at 58 years of age, then have this test every 5 years. Have your cholesterol levels checked more often if: Your lipid or cholesterol levels are high. You are older than 58 years of age. You are at high risk for heart disease. What should I know about cancer screening? Depending on your health history and family history, you may need to have cancer screening at various ages. This may include screening for: Breast cancer. Cervical cancer. Colorectal cancer. Skin cancer. Lung cancer. What should I know about heart disease, diabetes, and high blood pressure? Blood pressure and heart disease High blood pressure causes heart disease and increases the risk of stroke. This is more likely to develop in people who have high blood pressure readings or are overweight. Have your blood pressure checked: Every 3-5 years if you are 30-35 years of age. Every year if you are 11 years old or older. Diabetes Have regular diabetes screenings. This checks your fasting blood sugar level. Have the screening done: Once every three years after age 56 if you are at a normal weight and have a low risk for diabetes. More often and at a younger age if you are overweight or have a high risk for diabetes. What should I know about preventing infection? Hepatitis B If you have a higher risk for hepatitis B, you should be screened for this virus. Talk with your health care provider to find out if you are at risk for hepatitis B infection. Hepatitis C Testing is  recommended for: Everyone born from [birth year] through 1966. Anyone with known risk factors for hepatitis C. Sexually transmitted infections (STIs) Get screened for STIs, including gonorrhea and chlamydia, if: You are sexually active and are younger than 58 years of age. You are older than 58 years of age and your health care  provider tells you that you are at risk for this type of infection. Your sexual activity has changed since you were last screened, and you are at increased risk for chlamydia or gonorrhea. Ask your health care provider if you are at risk. Ask your health care provider about whether you are at high risk for HIV. Your health care provider may recommend a prescription medicine to help prevent HIV infection. If you choose to take medicine to prevent HIV, you should first get tested for HIV. You should then be tested every 3 months for as long as you are taking the medicine. Pregnancy If you are about to stop having your period (premenopausal) and you may become pregnant, seek counseling before you get pregnant. Take 400 to 800 micrograms (mcg) of folic acid every day if you become pregnant. Ask for birth control (contraception) if you want to prevent pregnancy. Osteoporosis and menopause Osteoporosis is a disease in which the bones lose minerals and strength with aging. This can result in bone fractures. If you are 69 years old or older, or if you are at risk for osteoporosis and fractures, ask your health care provider if you should: Be screened for bone loss. Take a calcium or vitamin D supplement to lower your risk of fractures. Be given hormone replacement therapy (HRT) to treat symptoms of menopause. Follow these instructions at home: Alcohol use Do not drink alcohol if: Your health care provider tells you not to drink. You are pregnant, may be pregnant, or are planning to become pregnant. If you drink alcohol: Limit how much you have to: 0-1 drink a day. Know how much alcohol is in your drink. In the [place] one drink equals one 12 oz bottle of beer (355 mL), one 5 oz glass of wine (148 mL), or one 1 oz glass of hard liquor (44 mL). Lifestyle Do not use any products that contain nicotine or tobacco. These products include cigarettes, chewing tobacco, and vaping devices, such as e-cigarettes.  If you need help quitting, ask your health care provider. Do not use street drugs. Do not share needles. Ask your health care provider for help if you need support or information about quitting drugs. General instructions Schedule regular health, dental, and eye exams. Stay current with your vaccines. Tell your health care provider if: You often feel depressed. You have ever been abused or do not feel safe at home. Summary Adopting a healthy lifestyle and getting preventive care are important in promoting health and wellness. Follow your health care provider's instructions about healthy diet, exercising, and getting tested or screened for diseases. Follow your health care provider's instructions on monitoring your cholesterol and blood pressure. This information is not intended to replace advice given to you by your health care provider. Make sure you discuss any questions you have with your health care provider. Document Revised: 07/03/2020 Document Reviewed: 07/03/2020 Elsevier Patient Education  [place].

## 2022-03-20 NOTE — Assessment & Plan Note (Signed)
We discussed the importance of regular physical activity and healthy diet for prevention of chronic illness and/or complications. Preventive guidelines reviewed. Vaccination up to date. Ca++ and vit D supplementation to continue. Continue her female preventive care with gyn. Next CPE in a year.

## 2022-04-02 ENCOUNTER — Ambulatory Visit: Payer: 59 | Attending: Physician Assistant | Admitting: Physician Assistant

## 2022-04-02 ENCOUNTER — Encounter: Payer: Self-pay | Admitting: Physician Assistant

## 2022-04-02 VITALS — BP 120/83 | HR 76 | Ht 61.0 in | Wt 137.8 lb

## 2022-04-02 DIAGNOSIS — M06321 Rheumatoid nodule, right elbow: Secondary | ICD-10-CM | POA: Diagnosis not present

## 2022-04-02 DIAGNOSIS — M0579 Rheumatoid arthritis with rheumatoid factor of multiple sites without organ or systems involvement: Secondary | ICD-10-CM

## 2022-04-02 DIAGNOSIS — M06322 Rheumatoid nodule, left elbow: Secondary | ICD-10-CM | POA: Diagnosis not present

## 2022-04-02 DIAGNOSIS — Z79899 Other long term (current) drug therapy: Secondary | ICD-10-CM | POA: Diagnosis not present

## 2022-04-02 DIAGNOSIS — M7732 Calcaneal spur, left foot: Secondary | ICD-10-CM

## 2022-04-02 DIAGNOSIS — Z111 Encounter for screening for respiratory tuberculosis: Secondary | ICD-10-CM

## 2022-04-02 DIAGNOSIS — E7800 Pure hypercholesterolemia, unspecified: Secondary | ICD-10-CM

## 2022-04-02 DIAGNOSIS — Z9889 Other specified postprocedural states: Secondary | ICD-10-CM

## 2022-04-02 DIAGNOSIS — M7731 Calcaneal spur, right foot: Secondary | ICD-10-CM

## 2022-04-02 DIAGNOSIS — G8929 Other chronic pain: Secondary | ICD-10-CM

## 2022-04-02 DIAGNOSIS — Z8679 Personal history of other diseases of the circulatory system: Secondary | ICD-10-CM

## 2022-04-02 DIAGNOSIS — F419 Anxiety disorder, unspecified: Secondary | ICD-10-CM

## 2022-04-02 DIAGNOSIS — M545 Low back pain, unspecified: Secondary | ICD-10-CM

## 2022-04-02 MED ORDER — RINVOQ 15 MG PO TB24: 1.0000 | ORAL_TABLET | Freq: Every day | ORAL | 0 refills | Status: DC

## 2022-04-02 NOTE — Patient Instructions (Signed)
Standing Labs We placed an order today for your standing lab work.   Please have your standing labs drawn in March and every 3 months   Please have your labs drawn 2 weeks prior to your appointment so that the provider can discuss your lab results at your appointment.  Please note that you may see your imaging and lab results in [facility] before we have reviewed them. We will contact you once all results are reviewed. Please allow our office up to 72 hours to thoroughly review all of the results before contacting the office for clarification of your results.  Lab hours are:   Monday through Thursday from 8:00 am -12:30 pm and 1:00 pm-5:00 pm and Friday from 8:00 am-12:00 pm.  Please be advised, all patients with office appointments requiring lab work will take precedent over walk-in lab work.   Labs are drawn by [facility]. Please bring your co-pay at the time of your lab draw.  You may receive a bill from [facility] for your lab work.  Please note if you are on Hydroxychloroquine and and an order has been placed for a Hydroxychloroquine level, you will need to have it drawn 4 hours or more after your last dose.  If you wish to have your labs drawn at another location, please call the office 24 hours in advance so we can fax the orders.  The office is located at [address], [place], [place], [place] [address] No appointment is necessary.    If you have any questions regarding directions or hours of operation,  please call [phone].   As a reminder, please drink plenty of water prior to coming for your lab work. Thanks!  If you have signs or symptoms of an infection or start antibiotics: First, call your PCP for workup of your infection. Hold your medication through the infection, until you complete your antibiotics, and until symptoms resolve if you take the following: Injectable medication (Actemra, Benlysta, Cimzia, Cosentyx, Enbrel, Humira, Kevzara, Orencia, Remicade, Simponi,  Stelara, Taltz, Tremfya) Methotrexate Leflunomide (Arava) Mycophenolate (Cellcept) Morrie Sheldon, Olumiant, or Rinvoq

## 2022-04-10 ENCOUNTER — Other Ambulatory Visit (HOSPITAL_COMMUNITY): Payer: Self-pay

## 2022-04-22 ENCOUNTER — Ambulatory Visit
Admission: RE | Admit: 2022-04-22 | Discharge: 2022-04-22 | Disposition: A | Discharge status: Home / Self Care | Payer: 59 | Source: Ambulatory Visit | Attending: Family Medicine | Admitting: Family Medicine

## 2022-04-22 DIAGNOSIS — Z1231 Encounter for screening mammogram for malignant neoplasm of breast: Secondary | ICD-10-CM

## 2022-05-09 ENCOUNTER — Telehealth: Payer: 59 | Admitting: Family Medicine

## 2022-05-09 DIAGNOSIS — R3989 Other symptoms and signs involving the genitourinary system: Secondary | ICD-10-CM | POA: Diagnosis not present

## 2022-05-09 MED ORDER — CEPHALEXIN 500 MG PO CAPS: 500.0000 mg | ORAL_CAPSULE | Freq: Two times a day (BID) | ORAL | 0 refills | Status: AC

## 2022-05-09 NOTE — Patient Instructions (Signed)
Vickie [name], thank you for joining Perlie Mayo, NP for today's virtual visit.  While this provider is not your primary care provider (PCP), if your PCP is located in our provider database this encounter information will be shared with them immediately following your visit.   [facility] account gives you access to today's visit and all your visits, tests, and labs performed at [facility] " click here if you don't have a [facility] account or go to [email or web address]  Consent: (Patient) Vickie [name] provided verbal consent for this virtual visit at the beginning of the encounter.  Current Medications:  Current Outpatient Medications:    cephALEXin (KEFLEX) 500 MG capsule, Take 1 capsule (500 mg total) by mouth 2 (two) times daily for 7 days., Disp: 14 capsule, Rfl: 0   amLODipine (NORVASC) 5 MG tablet, TAKE 1 TABLET BY MOUTH  DAILY, Disp: 90 tablet, Rfl: 3   famotidine (PEPCID) 40 MG tablet, Take 1 tablet (40 mg total) by mouth daily., Disp: 30 tablet, Rfl: 2   folic acid (FOLVITE) 1 MG tablet, Take 2 tablets (2 mg total) by mouth daily., Disp: 180 tablet, Rfl: 0   trimethoprim-polymyxin b (POLYTRIM) ophthalmic solution, Place 1 drop into the left eye every 4 (four) hours. X 5 days (Patient not taking: Reported on 04/02/2022), Disp: 10 mL, Rfl: 0   Upadacitinib ER (RINVOQ) 15 MG TB24, Take 1 tablet (15 mg total) by mouth daily., Disp: 90 tablet, Rfl: 0   Medications ordered in this encounter:  Meds ordered this encounter  Medications   cephALEXin (KEFLEX) 500 MG capsule    Sig: Take 1 capsule (500 mg total) by mouth 2 (two) times daily for 7 days.    Dispense:  14 capsule    Refill:  0    Order Specific Question:   Supervising Provider    Answer:   Chase Picket [id number]     *If you need refills on other medications prior to your next appointment, please contact your pharmacy*  Follow-Up: Call back or seek an in-person evaluation if the  symptoms worsen or if the condition fails to improve as anticipated.  [place] [phone]  Other Instructions Urinary Tract Infection, Adult  A urinary tract infection (UTI) is an infection of any part of the urinary tract. The urinary tract includes the kidneys, ureters, bladder, and urethra. These organs make, store, and get rid of urine in the body. An upper UTI affects the ureters and kidneys. A lower UTI affects the bladder and urethra. What are the causes? Most urinary tract infections are caused by bacteria in your genital area around your urethra, where urine leaves your body. These bacteria grow and cause inflammation of your urinary tract. What increases the risk? You are more likely to develop this condition if: You have a urinary catheter that stays in place. You are not able to control when you urinate or have a bowel movement (incontinence). You are female and you: Use a spermicide or diaphragm for birth control. Have low estrogen levels. Are pregnant. You have certain genes that increase your risk. You are sexually active. You take antibiotic medicines. You have a condition that causes your flow of urine to slow down, such as: An enlarged prostate, if you are female. Blockage in your urethra. A kidney stone. A nerve condition that affects your bladder control (neurogenic bladder). Not getting enough to drink, or not urinating often. You have certain medical conditions,  such as: Diabetes. A weak disease-fighting system (immunesystem). Sickle cell disease. Gout. Spinal cord injury. What are the signs or symptoms? Symptoms of this condition include: Needing to urinate right away (urgency). Frequent urination. This may include small amounts of urine each time you urinate. Pain or burning with urination. Blood in the urine. Urine that smells bad or unusual. Trouble urinating. Cloudy urine. Vaginal discharge, if you are female. Pain in the  abdomen or the lower back. You may also have: Vomiting or a decreased appetite. Confusion. Irritability or tiredness. A fever or chills. Diarrhea. The first symptom in older adults may be confusion. In some cases, they may not have any symptoms until the infection has worsened. How is this diagnosed? This condition is diagnosed based on your medical history and a physical exam. You may also have other tests, including: Urine tests. Blood tests. Tests for STIs (sexually transmitted infections). If you have had more than one UTI, a cystoscopy or imaging studies may be done to determine the cause of the infections. How is this treated? Treatment for this condition includes: Antibiotic medicine. Over-the-counter medicines to treat discomfort. Drinking enough water to stay hydrated. If you have frequent infections or have other conditions such as a kidney stone, you may need to see a health care provider who specializes in the urinary tract (urologist). In rare cases, urinary tract infections can cause sepsis. Sepsis is a life-threatening condition that occurs when the body responds to an infection. Sepsis is treated in the hospital with IV antibiotics, fluids, and other medicines. Follow these instructions at home:  Medicines Take over-the-counter and prescription medicines only as told by your health care provider. If you were prescribed an antibiotic medicine, take it as told by your health care provider. Do not stop using the antibiotic even if you start to feel better. General instructions Make sure you: Empty your bladder often and completely. Do not hold urine for long periods of time. Empty your bladder after sex. Wipe from front to back after urinating or having a bowel movement if you are female. Use each tissue only one time when you wipe. Drink enough fluid to keep your urine pale yellow. Keep all follow-up visits. This is important. Contact a health care provider if: Your  symptoms do not get better after 1-2 days. Your symptoms go away and then return. Get help right away if: You have severe pain in your back or your lower abdomen. You have a fever or chills. You have nausea or vomiting. Summary A urinary tract infection (UTI) is an infection of any part of the urinary tract, which includes the kidneys, ureters, bladder, and urethra. Most urinary tract infections are caused by bacteria in your genital area. Treatment for this condition often includes antibiotic medicines. If you were prescribed an antibiotic medicine, take it as told by your health care provider. Do not stop using the antibiotic even if you start to feel better. Keep all follow-up visits. This is important. This information is not intended to replace advice given to you by your health care provider. Make sure you discuss any questions you have with your health care provider. Document Revised: 09/24/2019 Document Reviewed: 09/24/2019 Elsevier Patient Education  [place].    If you have been instructed to have an in-person evaluation today at a local Urgent Care facility, please use the link below. It will take you to a list of all of our available [facility], including address, phone number and hours of  operation. Please do not delay care.  [facility]  If you or a family member do not have a primary care provider, use the link below to schedule a visit and establish care. When you choose a [facility] primary care physician or advanced practice provider, you gain a long-term partner in health. Find a Primary Care Provider  Learn more about [facility]'s in-office and virtual care options: [facility]

## 2022-05-09 NOTE — Progress Notes (Signed)
Virtual Visit Consent   Vickie [name], you are scheduled for a virtual visit with a [facility] provider today. Just as with appointments in the office, your consent must be obtained to participate. Your consent will be active for this visit and any virtual visit you may have with one of our providers in the next 365 days. If you have a MyChart account, a copy of this consent can be sent to you electronically.  As this is a virtual visit, video technology does not allow for your provider to perform a traditional examination. This may limit your provider's ability to fully assess your condition. If your provider identifies any concerns that need to be evaluated in person or the need to arrange testing (such as labs, EKG, etc.), we will make arrangements to do so. Although advances in technology are sophisticated, we cannot ensure that it will always work on either your end or our end. If the connection with a video visit is poor, the visit may have to be switched to a telephone visit. With either a video or telephone visit, we are not always able to ensure that we have a secure connection.  By engaging in this virtual visit, you consent to the provision of healthcare and authorize for your insurance to be billed (if applicable) for the services provided during this visit. Depending on your insurance coverage, you may receive a charge related to this service.  I need to obtain your verbal consent now. Are you willing to proceed with your visit today? Vickie [name] has provided verbal consent on 05/09/2022 for a virtual visit (video or telephone). Perlie Mayo, NP  Date: 05/09/2022 9:05 AM  Virtual Visit via Video Note   I, Perlie Mayo, connected with  Vickie [name]  ([id number], Oct 31, 1965) on 05/09/22 at  9:15 AM EDT by a video-enabled telemedicine application and verified that I am speaking with the correct person using two identifiers.  Location: Patient: Virtual Visit Location Patient:  Home Provider: Virtual Visit Location Provider: Home Office   I discussed the limitations of evaluation and management by telemedicine and the availability of in person appointments. The patient expressed understanding and agreed to proceed.    History of Present Illness: Vickie [name] is a 57 y.o. who identifies as a female who was assigned female at birth, and is being seen today for UTI    HPI: Urinary Tract Infection  This is a new problem. The current episode started today. The problem occurs every urination. The problem has been gradually worsening. The quality of the pain is described as burning. The patient is experiencing no pain. There has been no fever. She is Sexually active. There is No history of pyelonephritis. Associated symptoms include frequency, hesitancy and urgency. Pertinent negatives include no chills, discharge, flank pain, hematuria, nausea, possible pregnancy, sweats or vomiting. There is no history of kidney stones, recurrent UTIs, a single kidney, urinary stasis or a urological procedure.    Problems:  Patient Active Problem List   Diagnosis Date Noted   Routine general medical examination at a health care facility 03/20/2022   Subconjunctival hemorrhage of left eye 03/20/2022   Hyperlipidemia 01/08/2022   Anxiety 10/12/2020   PVC (premature ventricular contraction) 01/02/2017   Snoring 01/02/2017   High risk medication use 10/24/2016   Rheumatoid arthritis ([place]) 08/07/2016   Hypertension, essential 08/07/2016    Allergies:  Allergies  Allergen Reactions   Latex    Sulfa Antibiotics    Medications:  Current Outpatient Medications:    amLODipine (NORVASC) 5 MG tablet, TAKE 1 TABLET BY MOUTH  DAILY, Disp: 90 tablet, Rfl: 3   famotidine (PEPCID) 40 MG tablet, Take 1 tablet (40 mg total) by mouth daily., Disp: 30 tablet, Rfl: 2   folic acid (FOLVITE) 1 MG tablet, Take 2 tablets (2 mg total) by mouth daily., Disp: 180 tablet, Rfl: 0   trimethoprim-polymyxin  b (POLYTRIM) ophthalmic solution, Place 1 drop into the left eye every 4 (four) hours. X 5 days (Patient not taking: Reported on 04/02/2022), Disp: 10 mL, Rfl: 0   Upadacitinib ER (RINVOQ) 15 MG TB24, Take 1 tablet (15 mg total) by mouth daily., Disp: 90 tablet, Rfl: 0  Observations/Objective: Patient is well-developed, well-nourished in no acute distress.  Resting comfortably  at home.  Head is normocephalic, atraumatic.  No labored breathing.  Speech is clear and coherent with logical content.  Patient is alert and oriented at baseline.    Assessment and Plan:  1. Suspected UTI  - cephALEXin (KEFLEX) 500 MG capsule; Take 1 capsule (500 mg total) by mouth 2 (two) times daily for 7 days.  Dispense: 14 capsule; Refill: 0  -suspect UTI possibly post coital  -no other red flags for stone or kidney infection -increase fluids -complete medication as discussed -prevention discussed and on AVS   Reviewed side effects, risks and benefits of medication.    Patient acknowledged agreement and understanding of the plan.   Past Medical, Surgical, Social History, Allergies, and Medications have been Reviewed.    Follow Up Instructions: I discussed the assessment and treatment plan with the patient. The patient was provided an opportunity to ask questions and all were answered. The patient agreed with the plan and demonstrated an understanding of the instructions.  A copy of instructions were sent to the patient via MyChart unless otherwise noted below.    The patient was advised to call back or seek an in-person evaluation if the symptoms worsen or if the condition fails to improve as anticipated.  Time:  I spent 10 minutes with the patient via telehealth technology discussing the above problems/concerns.    Perlie Mayo, NP

## 2022-05-15 ENCOUNTER — Telehealth: Payer: 59

## 2022-05-15 NOTE — Progress Notes (Signed)
ACUTE VISIT Chief Complaint  Patient presents with   Recurrent UTI    Started last week with burning & irritation. Now having pressure & irritation.    HPI: Vickie [name] is a 57 y.o. female with past medical history significant for hypertension, hyperlipidemia, RA, and GERD here today complaining of burning sensation during urination and constant irritation in the genital area since last week.  Dysuria  This is a new problem. The current episode started 1 to 4 weeks ago. The problem has been unchanged. The quality of the pain is described as burning. The pain is moderate. There has been no fever. She is Sexually active. There is No history of pyelonephritis. Associated symptoms include frequency, hesitancy and urgency. Pertinent negatives include no chills, flank pain, hematuria or sweats. She has tried antibiotics for the symptoms. The treatment provided mild relief.  She had a virtual visit on 05/09/2022, she was prescribed Cephalexin, which partially resolved the symptoms. Despite this treatment, she continues to report constant irritation and a sensation of pressure, urinary frequency, and urgency.  She denies the presence of pruritus, vaginal discharge, or bleeding. There is no mention of abdominal pain, nausea, vomiting, fever, or visible blood in the urine. She confirms adequate fluid intake. She experienced a urinary tract infection (UTI) four years ago, a urine culture in 2019 more consistent with contamination and urine culture in 03/2017 grew E. coli resistant to ampicillin, ciprofloxacin, levofloxacin, and trimeth/sulfa.   Review of Systems  Constitutional:  Negative for chills.  Gastrointestinal:  Negative for abdominal pain.       No changes in bowel habits.  Genitourinary:  Positive for dysuria, frequency, hesitancy, urgency and vaginal pain. Negative for decreased urine volume, difficulty urinating, flank pain, hematuria and pelvic pain.  Musculoskeletal:  Negative for  myalgias.  Skin:  Negative for rash.  Neurological:  Negative for syncope and weakness.  See other pertinent positives and negatives in HPI.  Current Outpatient Medications on File Prior to Visit  Medication Sig Dispense Refill   amLODipine (NORVASC) 5 MG tablet TAKE 1 TABLET BY MOUTH  DAILY 90 tablet 3   famotidine (PEPCID) 40 MG tablet Take 1 tablet (40 mg total) by mouth daily. 30 tablet 2   folic acid (FOLVITE) 1 MG tablet Take 2 tablets (2 mg total) by mouth daily. 180 tablet 0   Upadacitinib ER (RINVOQ) 15 MG TB24 Take 1 tablet (15 mg total) by mouth daily. 90 tablet 0   No current facility-administered medications on file prior to visit.   Past Medical History:  Diagnosis Date   Anxiety 10/12/2020   Arthritis    RA Dx at age 53   Bell's palsy    Chicken pox    Hypertension    PVC (premature ventricular contraction) 01/02/2017   Rheumatoid arthritis ([place])    Snoring 01/02/2017   Allergies  Allergen Reactions   Latex    Sulfa Antibiotics     Social History   Socioeconomic History   Marital status: Married    Spouse name: Not on file   Number of children: Not on file   Years of education: Not on file   Highest education level: Bachelor's degree (e.g., BA, AB, BS)  Occupational History   Not on file  Tobacco Use   Smoking status: Never    Passive exposure: Never   Smokeless tobacco: Never  Vaping Use   Vaping Use: Never used  Substance and Sexual Activity   Alcohol use: Yes  Comment: occ   Drug use: No   Sexual activity: Not on file  Other Topics Concern   Not on file  Social History Narrative   Not on file   Social Determinants of Health   Financial Resource Strain: Low Risk  (01/05/2022)   Overall Financial Resource Strain (CARDIA)    Difficulty of Paying Living Expenses: Not very hard  Food Insecurity: No Food Insecurity (01/05/2022)   Hunger Vital Sign    Worried About Running Out of Food in the Last Year: Never true    Ran Out of Food in the  Last Year: Never true  Transportation Needs: No Transportation Needs (01/05/2022)   PRAPARE - Hydrologist (Medical): No    Lack of Transportation (Non-Medical): No  Physical Activity: Insufficiently Active (01/05/2022)   Exercise Vital Sign    Days of Exercise per Week: 3 days    Minutes of Exercise per Session: 40 min  Stress: No Stress Concern Present (01/05/2022)   [place]    Feeling of Stress : Only a little  Social Connections: Socially Integrated (01/05/2022)   Social Connection and Isolation Panel [NHANES]    Frequency of Communication with Friends and Family: Twice a week    Frequency of Social Gatherings with Friends and Family: Twice a week    Attends Religious Services: 1 to 4 times per year    Active Member of [employer] or Organizations: Yes    Attends Archivist Meetings: 1 to 4 times per year    Marital Status: Married   Vitals:   05/17/22 0723  BP: 120/80  Pulse: 70  Resp: 12  Temp: 98.4 F (36.9 C)  SpO2: 98%   Body mass index is 25.74 kg/m.  Physical Exam Vitals and nursing note reviewed.  Constitutional:      General: She is not in acute distress.    Appearance: She is well-developed.  HENT:     Head: Atraumatic.  Eyes:     Conjunctiva/sclera: Conjunctivae normal.  Cardiovascular:     Rate and Rhythm: Normal rate and regular rhythm.     Heart sounds: No murmur heard. Pulmonary:     Effort: Pulmonary effort is normal. No respiratory distress.     Breath sounds: Normal breath sounds.  Abdominal:     Palpations: Abdomen is soft. There is no mass.     Tenderness: There is no abdominal tenderness. There is no right CVA tenderness or left CVA tenderness.  Genitourinary:    Comments: Deferred to gyn if symptoms are persistent. Skin:    General: Skin is warm.     Findings: No erythema.  Neurological:     Mental Status: She is alert and oriented to  person, place, and time.  Psychiatric:        Mood and Affect: Mood and affect normal.   ASSESSMENT AND PLAN:  Vickie [name] was seen today for persistent dysuria.  Dysuria Treated recently with cephalexin with partial resolution of symptoms. We discussed differential diagnosis. Urine dipstick today abnormal: Positive for blood, large amount of leukocytes, and positive for protein. Urine sent for culture.  -     POCT Urinalysis Dipstick (Automated) -     Urine Culture; Future  Suspected UTI Persistent urinary symptoms and abnormal dipstick, will treat empirically with nitrofurantoin. We will follow urine culture and tailor treatment if necessary. Instructed about warning signs. Continue adequate fluid intake. Because urine dipstick positive for  blood, we will plan on repeating UA at next visit.  -     Nitrofurantoin Monohyd Macro; Take 1 capsule (100 mg total) by mouth 2 (two) times daily for 5 days.  Dispense: 10 capsule; Refill: 0  Vulvovaginitis Reporting constant sensation of vulvar/vaginal "irritation" with no vaginal discharge. ? Candidiasis. Empiric treatment with Terazol vaginal cream recommended. Monitor for new symptoms. If problem is persistent, recommend arranging follow-up with her gynecologist.  -     Terconazole; Place 1 applicator vaginally at bedtime.  Dispense: 45 g; Refill: 0  Return if symptoms worsen or fail to improve.  Genever Hentges G. [place], MD  [facility]. [place] office.

## 2022-05-17 ENCOUNTER — Encounter: Payer: Self-pay | Admitting: Family Medicine

## 2022-05-17 ENCOUNTER — Ambulatory Visit (INDEPENDENT_AMBULATORY_CARE_PROVIDER_SITE_OTHER): Payer: 59 | Admitting: Family Medicine

## 2022-05-17 VITALS — BP 120/80 | HR 70 | Temp 98.4°F | Resp 12 | Ht 61.0 in | Wt 136.2 lb

## 2022-05-17 DIAGNOSIS — R300 Dysuria: Secondary | ICD-10-CM

## 2022-05-17 DIAGNOSIS — R3989 Other symptoms and signs involving the genitourinary system: Secondary | ICD-10-CM

## 2022-05-17 DIAGNOSIS — N760 Acute vaginitis: Secondary | ICD-10-CM | POA: Diagnosis not present

## 2022-05-17 LAB — POC URINALSYSI DIPSTICK (AUTOMATED)
Bilirubin, UA: NEGATIVE
Blood, UA: POSITIVE
Glucose, UA: NEGATIVE
Ketones, UA: NEGATIVE
Nitrite, UA: NEGATIVE
Protein, UA: POSITIVE — AB
Spec Grav, UA: 1.015 (ref 1.010–1.025)
Urobilinogen, UA: 0.2 E.U./dL
pH, UA: 7 (ref 5.0–8.0)

## 2022-05-17 MED ORDER — TERCONAZOLE 0.4 % VA CREA: 1.0000 | TOPICAL_CREAM | Freq: Every day | VAGINAL | 0 refills | Status: DC

## 2022-05-17 MED ORDER — NITROFURANTOIN MONOHYD MACRO 100 MG PO CAPS: 100.0000 mg | ORAL_CAPSULE | Freq: Two times a day (BID) | ORAL | 0 refills | Status: AC

## 2022-05-17 NOTE — Patient Instructions (Signed)
A few things to remember from today's visit:  Dysuria - Plan: POCT Urinalysis Dipstick (Automated), Culture, Urine  Suspected UTI - Plan: nitrofurantoin, macrocrystal-monohydrate, (MACROBID) 100 MG capsule  Vulvovaginitis - Plan: terconazole (TERAZOL 7) 0.4 % vaginal cream  Could be a UTI resistant to prior antibiotic. Urine today was suggestive of infection, so you can try a different antibiotic. I also sent a cream to treat for possible yeast infection.  Adequate fluid intake, avoid holding urine for long hours, and over the counter Vit C OR cranberry capsules might help.  Today we will treat empirically with antibiotic, which we might need to change when urine culture comes back depending of bacteria susceptibility.  Seek immediate medical attention if severe abdominal pain, vomiting, fever/chills, or worsening symptoms. F/U if symptomatic are not any better after 2-3 days of antibiotic treatment.  If you need refills for medications you take chronically, please call your pharmacy. Do not use My Chart to request refills or for acute issues that need immediate attention. If you send a my chart message, it may take a few days to be addressed, specially if I am not in the office.  Please be sure medication list is accurate. If a new problem present, please set up appointment sooner than planned today.

## 2022-05-18 LAB — URINE CULTURE
MICRO NUMBER:: 14729441
Result:: NO GROWTH
SPECIMEN QUALITY:: ADEQUATE

## 2022-06-13 ENCOUNTER — Encounter: Payer: Self-pay | Admitting: *Deleted

## 2022-07-11 ENCOUNTER — Other Ambulatory Visit: Payer: Self-pay | Admitting: Physician Assistant

## 2022-07-11 DIAGNOSIS — M0579 Rheumatoid arthritis with rheumatoid factor of multiple sites without organ or systems involvement: Secondary | ICD-10-CM

## 2022-07-11 NOTE — Telephone Encounter (Signed)
Last Fill: 04/02/2022  Labs: 02/13/2022 White cell count is normal and stable.  CMP is normal.   TB Gold: 05/18/2021 negative    Next Visit: 09/10/2022  Last Visit: 04/02/2022  YQ:[id number] arthritis involving multiple sites with positive rheumatoid factor   Current Dose per office note on 04/02/2022: Rinvoq 15 mg 1 tablet by mouth daily.   Advised patient that she is over due for labs. Patient will come by tomorrow to update labs. Provided patient with lab hours. Standing orders are in place.   Okay to refill 30 day supply of Rinvoq?

## 2022-07-12 ENCOUNTER — Other Ambulatory Visit: Payer: Self-pay | Admitting: *Deleted

## 2022-07-12 DIAGNOSIS — Z111 Encounter for screening for respiratory tuberculosis: Secondary | ICD-10-CM

## 2022-07-12 DIAGNOSIS — Z79899 Other long term (current) drug therapy: Secondary | ICD-10-CM

## 2022-07-14 LAB — QUANTIFERON-TB GOLD PLUS
Mitogen-NIL: >10 IU/mL
NIL: 0.03 IU/mL
QuantiFERON-TB Gold Plus: NEGATIVE
TB1-NIL: 0 IU/mL
TB2-NIL: 0 IU/mL

## 2022-07-14 LAB — COMPLETE METABOLIC PANEL WITH GFR
AG Ratio: 1.7 (calc) (ref 1.0–2.5)
ALT: 15 U/L (ref 6–29)
AST: 25 U/L (ref 10–35)
Albumin: 4.7 g/dL (ref 3.6–5.1)
Alkaline phosphatase (APISO): 58 U/L (ref 37–153)
BUN: 15 mg/dL (ref 7–25)
CO2: 26 mmol/L (ref 20–32)
Calcium: 9.8 mg/dL (ref 8.6–10.4)
Chloride: 105 mmol/L (ref 98–110)
Creat: 0.71 mg/dL (ref 0.50–1.03)
Globulin: 2.7 g/dL (calc) (ref 1.9–3.7)
Glucose, Bld: 96 mg/dL (ref 65–99)
Potassium: 4.4 mmol/L (ref 3.5–5.3)
Sodium: 140 mmol/L (ref 135–146)
Total Bilirubin: 1.5 mg/dL — ABNORMAL HIGH (ref 0.2–1.2)
Total Protein: 7.4 g/dL (ref 6.1–8.1)
eGFR: 99 mL/min/{1.73_m2} (ref >=60)

## 2022-07-14 LAB — CBC WITH DIFFERENTIAL/PLATELET
Absolute Monocytes: 312 cells/uL (ref 200–950)
Basophils Absolute: 20 cells/uL (ref 0–200)
Basophils Relative: 0.5 %
Eosinophils Absolute: 20 cells/uL (ref 15–500)
Eosinophils Relative: 0.5 %
HCT: 41.5 % (ref 35.0–45.0)
Hemoglobin: 13.7 g/dL (ref 11.7–15.5)
Lymphs Abs: 876 cells/uL (ref 850–3900)
MCH: 30.9 pg (ref 27.0–33.0)
MCHC: 33 g/dL (ref 32.0–36.0)
MCV: 93.5 fL (ref 80.0–100.0)
MPV: 10.4 fL (ref 7.5–12.5)
Monocytes Relative: 7.8 %
Neutro Abs: 2772 cells/uL (ref 1500–7800)
Neutrophils Relative %: 69.3 %
Platelets: 344 10*3/uL (ref 140–400)
RBC: 4.44 10*6/uL (ref 3.80–5.10)
RDW: 13 % (ref 11.0–15.0)
Total Lymphocyte: 21.9 %
WBC: 4 10*3/uL (ref 3.8–10.8)

## 2022-07-15 NOTE — Progress Notes (Signed)
TB gold negative

## 2022-07-16 NOTE — Progress Notes (Signed)
CBC and CMP are stable.

## 2022-08-22 ENCOUNTER — Telehealth: Payer: Self-pay | Admitting: Pharmacist

## 2022-08-22 NOTE — Telephone Encounter (Signed)
Submitted a Prior Authorization renewal request to [facility] for RINVOQ via CoverMyMeds. Will update once we receive a response.  Key: [id number]  Chesley Mires, PharmD, MPH, BCPS, CPP Clinical Pharmacist (Rheumatology and Pulmonology)

## 2022-08-23 NOTE — Telephone Encounter (Signed)
Received notification from [facility] regarding a prior authorization for [facility]. Authorization has been APPROVED from 08/22/22 to 08/22/23. Approval letter sent to scan center.  Authorization #  [id number] Phone # [phone]  Chesley Mires, PharmD, MPH, BCPS, CPP Clinical Pharmacist (Rheumatology and Pulmonology)

## 2022-08-25 ENCOUNTER — Other Ambulatory Visit: Payer: Self-pay | Admitting: Rheumatology

## 2022-08-25 DIAGNOSIS — M0579 Rheumatoid arthritis with rheumatoid factor of multiple sites without organ or systems involvement: Secondary | ICD-10-CM

## 2022-08-26 NOTE — Telephone Encounter (Signed)
Last Fill: 07/11/2022 (30 day supply)  Labs: 07/12/2022 CBC and CMP are stable.   TB Gold: 07/12/2022 Neg    Next Visit: 09/10/2022  Last Visit: 04/12/2022  RU:[id number] arthritis involving multiple sites with positive rheumatoid factor   Current Dose per office note 04/12/2022: Rinvoq 15 mg 1 tablet by mouth daily.   Okay to refill Rinvoq?

## 2022-08-27 NOTE — Progress Notes (Deleted)
Office Visit Note  Patient: Vickie [name]             Date of Birth: 07-30-1965           MRN: [id number]             PCP: [place], Betty G, MD Referring: [place], Betty G, MD Visit Date: 09/10/2022 Occupation: @GUAROCC @  Subjective:  No chief complaint on file.   History of Present Illness: Vickie [name] is a 57 y.o. female ***     Activities of Daily Living:  Patient reports morning stiffness for *** {minute/hour:19697}.   Patient {ACTIONS;DENIES/REPORTS:21021675::"Denies"} nocturnal pain.  Difficulty dressing/grooming: {ACTIONS;DENIES/REPORTS:21021675::"Denies"} Difficulty climbing stairs: {ACTIONS;DENIES/REPORTS:21021675::"Denies"} Difficulty getting out of chair: {ACTIONS;DENIES/REPORTS:21021675::"Denies"} Difficulty using hands for taps, buttons, cutlery, and/or writing: {ACTIONS;DENIES/REPORTS:21021675::"Denies"}  No Rheumatology ROS completed.   PMFS History:  Patient Active Problem List   Diagnosis Date Noted   Routine general medical examination at a health care facility 03/20/2022   Subconjunctival hemorrhage of left eye 03/20/2022   Hyperlipidemia 01/08/2022   Anxiety 10/12/2020   PVC (premature ventricular contraction) 01/02/2017   Snoring 01/02/2017   High risk medication use 10/24/2016   Rheumatoid arthritis ([facility]) 08/07/2016   Hypertension, essential 08/07/2016    Past Medical History:  Diagnosis Date   Anxiety 10/12/2020   Arthritis    RA Dx at age 24   Bell's palsy    Chicken pox    Hypertension    PVC (premature ventricular contraction) 01/02/2017   Rheumatoid arthritis ([facility])    Snoring 01/02/2017    Family History  Problem Relation Age of Onset   Rheum arthritis Mother    Diabetes Neg Hx    Hypertension Neg Hx    Hyperlipidemia Neg Hx    Stroke Neg Hx    Colon cancer Neg Hx    Past Surgical History:  Procedure Laterality Date   BREAST BIOPSY Left    unsure of time  - ?2016 in [place]   CESAREAN SECTION     FOOT SURGERY Left 04/2019    Social History   Social History Narrative   Not on file   Immunization History  Administered Date(s) Administered   Influenza Inj Mdck Quad Pf 11/28/2016   Influenza Split 12/17/2019   Influenza,inj,Quad PF,6+ Mos 12/05/2015, 12/19/2017, 11/12/2018   Influenza-Unspecified 12/06/2020   PFIZER Comirnaty(Gray Top)Covid-19 Tri-Sucrose Vaccine 05/31/2020   PFIZER(Purple Top)SARS-COV-2 Vaccination 05/10/2019, 06/07/2019, 11/12/2019   Tdap 09/26/2014   Zoster Recombinant(Shingrix) 11/12/2018, 03/25/2019     Objective: Vital Signs: There were no vitals taken for this visit.   Physical Exam   Musculoskeletal Exam: ***  CDAI Exam: CDAI Score: -- Patient Global: --; Provider Global: -- Swollen: --; Tender: -- Joint Exam 09/10/2022   No joint exam has been documented for this visit   There is currently no information documented on the homunculus. Go to the Rheumatology activity and complete the homunculus joint exam.  Investigation: No additional findings.  Imaging: No results found.  Recent Labs: Lab Results  Component Value Date   WBC 4.0 07/12/2022   HGB 13.7 07/12/2022   PLT 344 07/12/2022   NA 140 07/12/2022   K 4.4 07/12/2022   CL 105 07/12/2022   CO2 26 07/12/2022   GLUCOSE 96 07/12/2022   BUN 15 07/12/2022   CREATININE 0.71 07/12/2022   BILITOT 1.5 (H) 07/12/2022   ALKPHOS 79 01/02/2017   AST 25 07/12/2022   ALT 15 07/12/2022   PROT 7.4 07/12/2022   ALBUMIN 4.3 01/02/2017  CALCIUM 9.8 07/12/2022   GFRAA 116 08/25/2020   QFTBGOLDPLUS NEGATIVE 07/12/2022    Speciality Comments: Rinvoq September 2021, inadequate response to methotrexate and Humira  Procedures:  No procedures performed Allergies: Latex and Sulfa antibiotics   Assessment / Plan:     Visit Diagnoses: No diagnosis found.  Orders: No orders of the defined types were placed in this encounter.  No orders of the defined types were placed in this encounter.   Face-to-face time spent  with patient was *** minutes. Greater than 50% of time was spent in counseling and coordination of care.  Follow-Up Instructions: No follow-ups on file.   Ellen Henri, CMA  Note - This record has been created using Animal nutritionist.  Chart creation errors have been sought, but may not always  have been located. Such creation errors do not reflect on  the standard of medical care.

## 2022-09-09 NOTE — Progress Notes (Signed)
Office Visit Note  Patient: Vickie [name]             Date of Birth: December 21, 1965           MRN: [id number]             PCP: [place], Betty G, MD Referring: [place], Betty G, MD Visit Date: 09/23/2022 Occupation: @GUAROCC @  Subjective:  Medication monitoring  History of Present Illness: Vickie [name] is a 57 y.o. female with history of seropositive rheumatoid arthritis.  Patient remains on  Rinvoq 15 mg 1 tablet by mouth daily.  She is tolerating Rinvoq without any side effects and has not missed any doses recently.  She denies any recent rheumatoid arthritis flares.  She denies any new medical conditions.  She denies any recent or recurrent infections.  She denies any morning stiffness or nocturnal pain.  She has not had any difficulty with ADLs.    Activities of Daily Living:  Patient reports morning stiffness for 0 minutes.   Patient Denies nocturnal pain.  Difficulty dressing/grooming: Denies Difficulty climbing stairs: Denies Difficulty getting out of chair: Denies Difficulty using hands for taps, buttons, cutlery, and/or writing: Denies  Review of Systems  Constitutional:  Negative for fatigue.  HENT:  Negative for mouth sores and mouth dryness.   Eyes:  Negative for dryness.  Respiratory:  Negative for shortness of breath.   Cardiovascular:  Negative for chest pain and palpitations.  Gastrointestinal:  Negative for blood in stool, constipation and diarrhea.  Endocrine: Negative for increased urination.  Genitourinary:  Negative for involuntary urination.  Musculoskeletal:  Negative for joint pain, gait problem, joint pain, joint swelling, myalgias, muscle weakness, morning stiffness, muscle tenderness and myalgias.  Skin:  Negative for color change, rash, hair loss and sensitivity to sunlight.  Allergic/Immunologic: Negative for susceptible to infections.  Neurological:  Negative for dizziness and headaches.  Hematological:  Negative for swollen glands.   Psychiatric/Behavioral:  Negative for depressed mood and sleep disturbance. The patient is not nervous/anxious.     PMFS History:  Patient Active Problem List   Diagnosis Date Noted   Routine general medical examination at a health care facility 03/20/2022   Subconjunctival hemorrhage of left eye 03/20/2022   Hyperlipidemia 01/08/2022   Anxiety 10/12/2020   PVC (premature ventricular contraction) 01/02/2017   Snoring 01/02/2017   High risk medication use 10/24/2016   Rheumatoid arthritis (HCC) 08/07/2016   Hypertension, essential 08/07/2016    Past Medical History:  Diagnosis Date   Anxiety 10/12/2020   Arthritis    RA Dx at age 44   Bell's palsy    Chicken pox    Hypertension    PVC (premature ventricular contraction) 01/02/2017   Rheumatoid arthritis (HCC)    Snoring 01/02/2017    Family History  Problem Relation Age of Onset   Rheum arthritis Mother    Diabetes Neg Hx    Hypertension Neg Hx    Hyperlipidemia Neg Hx    Stroke Neg Hx    Colon cancer Neg Hx    Past Surgical History:  Procedure Laterality Date   BREAST BIOPSY Left    unsure of time  - ?2016 in [place]   CESAREAN SECTION     FOOT SURGERY Left 04/2019   Social History   Social History Narrative   Not on file   Immunization History  Administered Date(s) Administered   Influenza Inj Mdck Quad Pf 11/28/2016   Influenza Split 12/17/2019   Influenza,inj,Quad PF,6+ Mos 12/05/2015,  12/19/2017, 11/12/2018   Influenza-Unspecified 12/06/2020   PFIZER Comirnaty(Gray Top)Covid-19 Tri-Sucrose Vaccine 05/31/2020   PFIZER(Purple Top)SARS-COV-2 Vaccination 05/10/2019, 06/07/2019, 11/12/2019   Tdap 09/26/2014   Zoster Recombinant(Shingrix) 11/12/2018, 03/25/2019     Objective: Vital Signs: BP 111/76 (BP Location: Left Arm, Patient Position: Sitting, Cuff Size: Normal)   Pulse 78   Resp 16   Ht 5\' 2"  (1.575 m)   Wt 134 lb 6.4 oz (61 kg)   BMI 24.58 kg/m    Physical Exam Vitals and nursing note  reviewed.  Constitutional:      Appearance: She is well-developed.  HENT:     Head: Normocephalic and atraumatic.  Eyes:     Conjunctiva/sclera: Conjunctivae normal.  Cardiovascular:     Rate and Rhythm: Normal rate and regular rhythm.     Heart sounds: Normal heart sounds.  Pulmonary:     Effort: Pulmonary effort is normal.     Breath sounds: Normal breath sounds.  Abdominal:     General: Bowel sounds are normal.     Palpations: Abdomen is soft.  Musculoskeletal:     Cervical back: Normal range of motion.  Lymphadenopathy:     Cervical: No cervical adenopathy.  Skin:    General: Skin is warm and dry.     Capillary Refill: Capillary refill takes less than 2 seconds.  Neurological:     Mental Status: She is alert and oriented to person, place, and time.  Psychiatric:        Behavior: Behavior normal.      Musculoskeletal Exam: C-spine, thoracic spine, lumbar spine have good range of motion.  Shoulder joints and elbow joints have good range of motion.  Limited range of motion of both wrist joints with synovial thickening bilaterally.  Ulnar deviation and synovial thickening of MCP joints.  Tenderness over the right second MCP joint.  Hip joints have good range of motion with no groin pain.  Knee joints have good range of motion with no warmth or effusion.  Ankle joints have good range of motion with no tenderness or synovitis.  CDAI Exam: CDAI Score: -- Patient Global: 10 / 100; Provider Global: 10 / 100 Swollen: --; Tender: -- Joint Exam 09/23/2022   No joint exam has been documented for this visit   There is currently no information documented on the homunculus. Go to the Rheumatology activity and complete the homunculus joint exam.  Investigation: No additional findings.  Imaging: No results found.  Recent Labs: Lab Results  Component Value Date   WBC 4.0 07/12/2022   HGB 13.7 07/12/2022   PLT 344 07/12/2022   NA 140 07/12/2022   K 4.4 07/12/2022   CL 105  07/12/2022   CO2 26 07/12/2022   GLUCOSE 96 07/12/2022   BUN 15 07/12/2022   CREATININE 0.71 07/12/2022   BILITOT 1.5 (H) 07/12/2022   ALKPHOS 79 01/02/2017   AST 25 07/12/2022   ALT 15 07/12/2022   PROT 7.4 07/12/2022   ALBUMIN 4.3 01/02/2017   CALCIUM 9.8 07/12/2022   GFRAA 116 08/25/2020   QFTBGOLDPLUS NEGATIVE 07/12/2022    Speciality Comments: Rinvoq September 2021, inadequate response to methotrexate and Humira  Procedures:  No procedures performed Allergies: Latex and Sulfa antibiotics   Assessment / Plan:     Visit Diagnoses: Rheumatoid arthritis involving multiple sites with positive rheumatoid factor (HCC) - Positive RF, positive anti-CCP, elevated ESR, erosive disease with nodulosis: She has no synovitis on examination today.  She has not had any signs or symptoms  of a rheumatoid arthritis flare.  She has clinically been doing well taking Rinvoq 15 mg 1 tablet by mouth daily.  She is tolerating Rinvoq without any side effects and has not missed any doses recently.  She has not been experiencing any morning stiffness, nocturnal pain, or difficulty with ADLs.  She will remain on Rinvoq as monotherapy.  She was advised to notify us if she develops signs or symptoms of a flare.  She will follow-up in the office in 5 months or sooner if needed.  High risk medication use - Rinvoq 15 mg 1 tablet by mouth daily. D/c Rasuvo due to clinically doing well. CBC and CMP updated on 07/12/22.  She will return for lab work in August and every 3 months to monitor for drug toxicity.  Standing orders for CBC and CMP replaced today. TB gold negative on 07/12/22.  No recent or recurrent infections.  Discussed the importance of holding rinvoq if she develops signs or symptoms of an infection and to resume once the infection has completely cleared.   - Plan: CBC with Differential/Platelet, COMPLETE METABOLIC PANEL WITH GFR  Rheumatoid nodule, right elbow (HCC) - Resolved.  Rheumatoid nodule, left  elbow (HCC) - Resolved.  S/P bunionectomy - She had a bunionectomy by Dr. Victorino Dike in 2021.  Calcaneal spur of both feet: She is wearing proper fitting shoes.  Other medical conditions are listed as follows:  Chronic midline low back pain without sciatica: She is not experiencing any increased discomfort in her lower back currently.   Elevated cholesterol: Total cholesterol is 226, triglycerides 184, LDL 123 on 03/20/2022.  Future order for lipid panel placed today.  History of hypertension: Blood pressure was 111/76 today in the office.  Anxiety  Orders: Orders Placed This Encounter  Procedures   CBC with Differential/Platelet   COMPLETE METABOLIC PANEL WITH GFR   Lipid panel   No orders of the defined types were placed in this encounter.    Follow-Up Instructions: Return in about 5 months (around 02/23/2023) for Rheumatoid arthritis.   Gearldine Bienenstock, PA-C  Note - This record has been created using Dragon software.  Chart creation errors have been sought, but may not always  have been located. Such creation errors do not reflect on  the standard of medical care.

## 2022-09-10 ENCOUNTER — Ambulatory Visit: Payer: 59 | Admitting: Rheumatology

## 2022-09-10 DIAGNOSIS — M7731 Calcaneal spur, right foot: Secondary | ICD-10-CM

## 2022-09-10 DIAGNOSIS — Z79899 Other long term (current) drug therapy: Secondary | ICD-10-CM

## 2022-09-10 DIAGNOSIS — M06321 Rheumatoid nodule, right elbow: Secondary | ICD-10-CM

## 2022-09-10 DIAGNOSIS — G8929 Other chronic pain: Secondary | ICD-10-CM

## 2022-09-10 DIAGNOSIS — F419 Anxiety disorder, unspecified: Secondary | ICD-10-CM

## 2022-09-10 DIAGNOSIS — E7800 Pure hypercholesterolemia, unspecified: Secondary | ICD-10-CM

## 2022-09-10 DIAGNOSIS — Z9889 Other specified postprocedural states: Secondary | ICD-10-CM

## 2022-09-10 DIAGNOSIS — M0579 Rheumatoid arthritis with rheumatoid factor of multiple sites without organ or systems involvement: Secondary | ICD-10-CM

## 2022-09-10 DIAGNOSIS — M06322 Rheumatoid nodule, left elbow: Secondary | ICD-10-CM

## 2022-09-10 DIAGNOSIS — Z8679 Personal history of other diseases of the circulatory system: Secondary | ICD-10-CM

## 2022-09-23 ENCOUNTER — Encounter: Payer: Self-pay | Admitting: Physician Assistant

## 2022-09-23 ENCOUNTER — Ambulatory Visit: Payer: 59 | Attending: Rheumatology | Admitting: Physician Assistant

## 2022-09-23 VITALS — BP 111/76 | HR 78 | Resp 16 | Ht 62.0 in | Wt 134.4 lb

## 2022-09-23 DIAGNOSIS — E7800 Pure hypercholesterolemia, unspecified: Secondary | ICD-10-CM

## 2022-09-23 DIAGNOSIS — M545 Low back pain, unspecified: Secondary | ICD-10-CM

## 2022-09-23 DIAGNOSIS — M06322 Rheumatoid nodule, left elbow: Secondary | ICD-10-CM

## 2022-09-23 DIAGNOSIS — Z79899 Other long term (current) drug therapy: Secondary | ICD-10-CM

## 2022-09-23 DIAGNOSIS — M7731 Calcaneal spur, right foot: Secondary | ICD-10-CM

## 2022-09-23 DIAGNOSIS — M06321 Rheumatoid nodule, right elbow: Secondary | ICD-10-CM

## 2022-09-23 DIAGNOSIS — F419 Anxiety disorder, unspecified: Secondary | ICD-10-CM

## 2022-09-23 DIAGNOSIS — Z8679 Personal history of other diseases of the circulatory system: Secondary | ICD-10-CM

## 2022-09-23 DIAGNOSIS — M7732 Calcaneal spur, left foot: Secondary | ICD-10-CM

## 2022-09-23 DIAGNOSIS — M0579 Rheumatoid arthritis with rheumatoid factor of multiple sites without organ or systems involvement: Secondary | ICD-10-CM

## 2022-09-23 DIAGNOSIS — Z9889 Other specified postprocedural states: Secondary | ICD-10-CM

## 2022-09-23 DIAGNOSIS — G8929 Other chronic pain: Secondary | ICD-10-CM

## 2022-09-23 NOTE — Patient Instructions (Signed)
Standing Labs We placed an order today for your standing lab work.   Please have your standing labs drawn in August and every 3 months   Please have your labs drawn 2 weeks prior to your appointment so that the provider can discuss your lab results at your appointment, if possible.  Please note that you may see your imaging and lab results in MyChart before we have reviewed them. We will contact you once all results are reviewed. Please allow our office up to 72 hours to thoroughly review all of the results before contacting the office for clarification of your results.  WALK-IN LAB HOURS  Monday through Thursday from 8:00 am -12:30 pm and 1:00 pm-5:00 pm and Friday from 8:00 am-12:00 pm.  Patients with office visits requiring labs will be seen before walk-in labs.  You may encounter longer than normal wait times. Please allow additional time. Wait times may be shorter on  Monday and Thursday afternoons.  We do not book appointments for walk-in labs. We appreciate your patience and understanding with our staff.   Labs are drawn by [facility]. Please bring your co-pay at the time of your lab draw.  You may receive a bill from [facility] for your lab work.  Please note if you are on Hydroxychloroquine and and an order has been placed for a Hydroxychloroquine level,  you will need to have it drawn 4 hours or more after your last dose.  If you wish to have your labs drawn at another location, please call the office 24 hours in advance so we can fax the orders.  The office is located at [address], [address], [place], [place] [address]   If you have any questions regarding directions or hours of operation,  please call [phone].   As a reminder, please drink plenty of water prior to coming for your lab work. Thanks!  

## 2022-10-08 ENCOUNTER — Telehealth: Payer: 59 | Admitting: Family Medicine

## 2022-10-08 DIAGNOSIS — L539 Erythematous condition, unspecified: Secondary | ICD-10-CM

## 2022-10-08 DIAGNOSIS — W57XXXA Bitten or stung by nonvenomous insect and other nonvenomous arthropods, initial encounter: Secondary | ICD-10-CM

## 2022-10-08 NOTE — Progress Notes (Signed)
[place]   Tick bite 3 weeks ago- still has a spot, RA and on treatment- advised in person for blood work, given the RA and might not notice joint pain as a sign.  Patient acknowledged agreement and understanding of the plan.

## 2022-10-09 ENCOUNTER — Ambulatory Visit (INDEPENDENT_AMBULATORY_CARE_PROVIDER_SITE_OTHER): Payer: 59 | Admitting: Family Medicine

## 2022-10-09 ENCOUNTER — Encounter: Payer: Self-pay | Admitting: Family Medicine

## 2022-10-09 VITALS — BP 92/60 | HR 82 | Temp 97.5°F | Ht 62.0 in | Wt 134.9 lb

## 2022-10-09 DIAGNOSIS — R300 Dysuria: Secondary | ICD-10-CM

## 2022-10-09 DIAGNOSIS — S70362A Insect bite (nonvenomous), left thigh, initial encounter: Secondary | ICD-10-CM | POA: Diagnosis not present

## 2022-10-09 DIAGNOSIS — W57XXXA Bitten or stung by nonvenomous insect and other nonvenomous arthropods, initial encounter: Secondary | ICD-10-CM | POA: Diagnosis not present

## 2022-10-09 LAB — POC URINALSYSI DIPSTICK (AUTOMATED)
Bilirubin, UA: NEGATIVE
Blood, UA: POSITIVE
Glucose, UA: NEGATIVE
Ketones, UA: NEGATIVE
Nitrite, UA: NEGATIVE
Protein, UA: NEGATIVE
Spec Grav, UA: 1.015 (ref 1.010–1.025)
Urobilinogen, UA: 0.2 E.U./dL
pH, UA: 6 (ref 5.0–8.0)

## 2022-10-09 MED ORDER — NITROFURANTOIN MONOHYD MACRO 100 MG PO CAPS: 100.0000 mg | ORAL_CAPSULE | Freq: Two times a day (BID) | ORAL | 0 refills | Status: DC

## 2022-10-09 NOTE — Addendum Note (Signed)
Addended by: Donald Pore A on: 10/09/2022 08:58 AM   Modules accepted: Orders

## 2022-10-09 NOTE — Progress Notes (Signed)
Established Patient Office Visit  Subjective   Patient ID: Vickie [name], female    DOB: 1965/09/07  Age: 57 y.o. MRN: [id number]  Chief Complaint  Patient presents with   Insect Bite    Patient complains of tick bite, x1 month   Dysuria    HPI   Vickie [name] is seen for the following issues  About 3 weeks ago small deer tick left anterior thigh.  She was able to remove this in entirety.  She has had some mild itching locally and has used some topical cortisone cream.  Denies any fever, headaches, body aches, or arthralgias.  No other skin rash.  She apparently did a video visit yesterday and was told that she needed to take antibodies because of her RA history.  She does have urinary symptoms with burning with urination and some frequency past couple days.  Similar symptoms back in March.  Again, no fever.  No gross hematuria.  Past Medical History:  Diagnosis Date   Anxiety 10/12/2020   Arthritis    RA Dx at age 17   Bell's palsy    Chicken pox    Hypertension    PVC (premature ventricular contraction) 01/02/2017   Rheumatoid arthritis (HCC)    Snoring 01/02/2017   Past Surgical History:  Procedure Laterality Date   BREAST BIOPSY Left    unsure of time  - ?2016 in [place]   CESAREAN SECTION     FOOT SURGERY Left 04/2019    reports that she has never smoked. She has never been exposed to tobacco smoke. She has never used smokeless tobacco. She reports current alcohol use. She reports that she does not use drugs. family history includes Rheum arthritis in her mother. Allergies  Allergen Reactions   Latex    Sulfa Antibiotics     Review of Systems  Constitutional:  Negative for chills and fever.  Genitourinary:  Positive for dysuria and frequency. Negative for flank pain and hematuria.  Musculoskeletal:  Negative for joint pain and myalgias.  Skin:  Negative for rash.  Neurological:  Negative for headaches.      Objective:     BP 92/60 (BP Location: Left Arm,  Patient Position: Sitting, Cuff Size: Normal)   Pulse 82   Temp (!) 97.5 F (36.4 C) (Oral)   Ht 5\' 2"  (1.575 m)   Wt 134 lb 14.4 oz (61.2 kg)   SpO2 99%   BMI 24.67 kg/m  BP Readings from Last 3 Encounters:  10/09/22 92/60  09/23/22 111/76  05/17/22 120/80   Wt Readings from Last 3 Encounters:  10/09/22 134 lb 14.4 oz (61.2 kg)  09/23/22 134 lb 6.4 oz (61 kg)  05/17/22 136 lb 4 oz (61.8 kg)      Physical Exam Vitals reviewed.  Constitutional:      General: She is not in acute distress.    Appearance: Normal appearance. She is not ill-appearing.  Cardiovascular:     Rate and Rhythm: Normal rate and regular rhythm.  Skin:    Comments: Very small punctate papule left anterior thigh.  No surrounding rash.  No visible retained tick parts. No other skin rash noted  Neurological:     Mental Status: She is alert.      Results for orders placed or performed in visit on 10/09/22  POCT Urinalysis Dipstick (Automated)  Result Value Ref Range   Color, UA Yellow    Clarity, UA Clear    Glucose, UA Negative Negative  Bilirubin, UA Negative    Ketones, UA Negative    Spec Grav, UA 1.015 1.010 - 1.025   Blood, UA Positive    pH, UA 6.0 5.0 - 8.0   Protein, UA Negative Negative   Urobilinogen, UA 0.2 0.2 or 1.0 E.U./dL   Nitrite, UA Negative    Leukocytes, UA Moderate (2+) (A) Negative      The 10-year ASCVD risk score (Arnett DK, et al., 2019) is: 1.6%    Assessment & Plan:   #1 tick bite left anterior thigh with deer tick.  She has no concerning symptoms whatsoever at this time including no headache, fever, chills, myalgias, arthralgias, or any skin rash other than very localized allergic reaction from the bite.  No indication for antibiotics at this time unless she develops any new symptoms.  No clear indication for serologies based on her total lack of symptoms but be in touch if she has any new symptoms.  Handout on tick bite given.  #2 dysuria.  Urine dipstick  reveals leukocytes and blood.  Urine culture sent.  Cover with Macrobid 1 twice daily for 5 days pending culture results.  Push fluids.  Follow-up for any persistent or worsening symptoms   No follow-ups on file.    Vickie Peat, MD

## 2022-10-10 LAB — URINE CULTURE
MICRO NUMBER:: 15330027
SPECIMEN QUALITY:: ADEQUATE

## 2022-10-11 MED ORDER — AMOXICILLIN 500 MG PO CAPS: 500.0000 mg | ORAL_CAPSULE | Freq: Three times a day (TID) | ORAL | 0 refills | Status: AC

## 2022-10-11 NOTE — Addendum Note (Signed)
Addended by: Christy Sartorius on: 10/11/2022 09:12 AM   Modules accepted: Orders

## 2022-10-16 ENCOUNTER — Other Ambulatory Visit: Payer: Self-pay | Admitting: *Deleted

## 2022-10-16 DIAGNOSIS — E7800 Pure hypercholesterolemia, unspecified: Secondary | ICD-10-CM

## 2022-10-16 DIAGNOSIS — Z79899 Other long term (current) drug therapy: Secondary | ICD-10-CM

## 2022-10-16 DIAGNOSIS — M0579 Rheumatoid arthritis with rheumatoid factor of multiple sites without organ or systems involvement: Secondary | ICD-10-CM

## 2022-10-16 LAB — CBC WITH DIFFERENTIAL/PLATELET
Absolute Monocytes: 372 cells/uL (ref 200–950)
Basophils Absolute: 18 cells/uL (ref 0–200)
Basophils Relative: 0.3 %
Eosinophils Absolute: 18 cells/uL (ref 15–500)
Eosinophils Relative: 0.3 %
HCT: 43 % (ref 35.0–45.0)
Hemoglobin: 14.1 g/dL (ref 11.7–15.5)
Lymphs Abs: 1298 cells/uL (ref 850–3900)
MCH: 30.8 pg (ref 27.0–33.0)
MCHC: 32.8 g/dL (ref 32.0–36.0)
MCV: 93.9 fL (ref 80.0–100.0)
MPV: 10 fL (ref 7.5–12.5)
Monocytes Relative: 6.3 %
Neutro Abs: 4195 cells/uL (ref 1500–7800)
Neutrophils Relative %: 71.1 %
Platelets: 379 10*3/uL (ref 140–400)
RBC: 4.58 10*6/uL (ref 3.80–5.10)
RDW: 12.7 % (ref 11.0–15.0)
Total Lymphocyte: 22 %
WBC: 5.9 10*3/uL (ref 3.8–10.8)

## 2022-10-16 LAB — COMPLETE METABOLIC PANEL WITH GFR
AG Ratio: 1.6 (calc) (ref 1.0–2.5)
ALT: 17 U/L (ref 6–29)
AST: 28 U/L (ref 10–35)
Albumin: 4.6 g/dL (ref 3.6–5.1)
Alkaline phosphatase (APISO): 67 U/L (ref 37–153)
BUN: 14 mg/dL (ref 7–25)
CO2: 26 mmol/L (ref 20–32)
Calcium: 10.3 mg/dL (ref 8.6–10.4)
Chloride: 103 mmol/L (ref 98–110)
Creat: 0.65 mg/dL (ref 0.50–1.03)
Globulin: 2.9 g/dL (calc) (ref 1.9–3.7)
Glucose, Bld: 78 mg/dL (ref 65–99)
Potassium: 4.2 mmol/L (ref 3.5–5.3)
Sodium: 138 mmol/L (ref 135–146)
Total Bilirubin: 1.1 mg/dL (ref 0.2–1.2)
Total Protein: 7.5 g/dL (ref 6.1–8.1)
eGFR: 103 mL/min/{1.73_m2} (ref >=60)

## 2022-10-16 LAB — LIPID PANEL
Cholesterol: 268 mg/dL — ABNORMAL HIGH (ref <200)
HDL: 76 mg/dL (ref >=50)
LDL Cholesterol (Calc): 146 mg/dL — ABNORMAL HIGH
Non-HDL Cholesterol (Calc): 192 mg/dL (calc) — ABNORMAL HIGH (ref <130)
Total CHOL/HDL Ratio: 3.5 (calc) (ref <5.0)
Triglycerides: 298 mg/dL — ABNORMAL HIGH (ref <150)

## 2022-10-17 NOTE — Progress Notes (Signed)
Total cholesterol is elevated-268.  Triglycerides are elevated and continue to trend up.  LDL is elevated-146. Please notify the patient and forward results to PCP.   CBC and CMP WNL.

## 2022-10-25 ENCOUNTER — Encounter: Payer: Self-pay | Admitting: Family Medicine

## 2022-11-06 ENCOUNTER — Encounter: Payer: Self-pay | Admitting: Family Medicine

## 2022-11-08 ENCOUNTER — Other Ambulatory Visit: Payer: Self-pay | Admitting: Family Medicine

## 2022-11-08 DIAGNOSIS — E785 Hyperlipidemia, unspecified: Secondary | ICD-10-CM

## 2022-11-08 MED ORDER — ATORVASTATIN CALCIUM 20 MG PO TABS: 20.0000 mg | ORAL_TABLET | Freq: Every day | ORAL | 3 refills | Status: DC

## 2022-11-29 ENCOUNTER — Other Ambulatory Visit: Payer: Self-pay | Admitting: Family Medicine

## 2022-11-29 DIAGNOSIS — I10 Essential (primary) hypertension: Secondary | ICD-10-CM

## 2022-12-11 ENCOUNTER — Encounter: Payer: Self-pay | Admitting: Family Medicine

## 2022-12-11 ENCOUNTER — Ambulatory Visit (INDEPENDENT_AMBULATORY_CARE_PROVIDER_SITE_OTHER): Payer: 59 | Admitting: Family Medicine

## 2022-12-11 VITALS — BP 116/70 | HR 82 | Temp 97.9°F | Ht 62.0 in | Wt 135.6 lb

## 2022-12-11 DIAGNOSIS — R300 Dysuria: Secondary | ICD-10-CM

## 2022-12-11 LAB — POC URINALSYSI DIPSTICK (AUTOMATED)
Bilirubin, UA: NEGATIVE
Blood, UA: POSITIVE
Glucose, UA: NEGATIVE
Ketones, UA: NEGATIVE
Nitrite, UA: NEGATIVE
Protein, UA: POSITIVE — AB
Spec Grav, UA: 1.015 (ref 1.010–1.025)
Urobilinogen, UA: 0.2 U/dL
pH, UA: 7 (ref 5.0–8.0)

## 2022-12-11 MED ORDER — NITROFURANTOIN MONOHYD MACRO 100 MG PO CAPS: 100.0000 mg | ORAL_CAPSULE | Freq: Two times a day (BID) | ORAL | 0 refills | Status: DC

## 2022-12-11 NOTE — Progress Notes (Signed)
Established Patient Office Visit  Subjective   Patient ID: Vickie [name], female    DOB: [birth year]  Age: 57 y.o. MRN: [id number]  Chief Complaint  Patient presents with   Urinary Tract Infection    HPI   Vickie [name] is seen with 1 day history of urine frequency and burning with urination.  She had UTI back in August with strep agalactiae.  She had symptoms in March but urine culture came back negative then.  Stays well-hydrated.  Denies any recent fever, flank pain, gross hematuria, nausea, or vomiting.  She is unsure why she seems to be suddenly getting more frequent UTIs.  She is monogamous.  No recent reported vaginal discharge.  Past Medical History:  Diagnosis Date   Anxiety 10/12/2020   Arthritis    RA Dx at age 58   Bell's palsy    Chicken pox    Hypertension    PVC (premature ventricular contraction) 01/02/2017   Rheumatoid arthritis (HCC)    Snoring 01/02/2017   Past Surgical History:  Procedure Laterality Date   BREAST BIOPSY Left    unsure of time  - ?2016 in [place]   CESAREAN SECTION     FOOT SURGERY Left 04/2019    reports that she has never smoked. She has never been exposed to tobacco smoke. She has never used smokeless tobacco. She reports current alcohol use. She reports that she does not use drugs. family history includes Rheum arthritis in her mother. Allergies  Allergen Reactions   Latex    Sulfa Antibiotics     Review of Systems  Constitutional:  Negative for chills and fever.  Gastrointestinal:  Negative for nausea and vomiting.  Genitourinary:  Positive for dysuria and frequency. Negative for flank pain and hematuria.      Objective:     BP 116/70 (BP Location: Left Arm, Patient Position: Sitting, Cuff Size: Normal)   Pulse 82   Temp 97.9 F (36.6 C) (Oral)   Ht 5\' 2"  (1.575 m)   Wt 135 lb 9.6 oz (61.5 kg)   SpO2 99%   BMI 24.80 kg/m  BP Readings from Last 3 Encounters:  12/11/22 116/70  10/09/22 92/60  09/23/22 111/76   Wt Readings  from Last 3 Encounters:  12/11/22 135 lb 9.6 oz (61.5 kg)  10/09/22 134 lb 14.4 oz (61.2 kg)  09/23/22 134 lb 6.4 oz (61 kg)      Physical Exam Vitals reviewed.  Constitutional:      General: She is not in acute distress.    Appearance: Normal appearance. She is not ill-appearing.  Cardiovascular:     Rate and Rhythm: Normal rate and regular rhythm.  Pulmonary:     Effort: Pulmonary effort is normal.     Breath sounds: Normal breath sounds.  Neurological:     Mental Status: She is alert.      Results for orders placed or performed in visit on 12/11/22  POCT Urinalysis Dipstick (Automated)  Result Value Ref Range   Color, UA Yellow    Clarity, UA Clear    Glucose, UA Negative Negative   Bilirubin, UA Negative    Ketones, UA Negative    Spec Grav, UA 1.015 1.010 - 1.025   Blood, UA Positive    pH, UA 7.0 5.0 - 8.0   Protein, UA Positive (A) Negative   Urobilinogen, UA 0.2 0.2 or 1.0 E.U./dL   Nitrite, UA Negative    Leukocytes, UA Large (3+) (A) Negative  The 10-year ASCVD risk score (Arnett DK, et al., 2019) is: 3%    Assessment & Plan:   Problem List Items Addressed This Visit   None Visit Diagnoses     Dysuria    -  Primary   Relevant Orders   POCT Urinalysis Dipstick (Automated) (Completed)   Urine Culture     1 day history of urinary symptoms suggestive of possible uncomplicated cystitis.  Urine culture sent.  Start Macrobid 1 twice daily pending culture results.  Stay well-hydrated.  Follow-up immediately for any fever or worsening symptoms  No follow-ups on file.    Evelena Peat, MD

## 2022-12-11 NOTE — Addendum Note (Signed)
Addended by: Clearnce Sorrel on: 12/11/2022 04:53 PM   Modules accepted: Orders

## 2022-12-13 ENCOUNTER — Ambulatory Visit: Payer: 59 | Admitting: Family Medicine

## 2022-12-15 LAB — URINE CULTURE
MICRO NUMBER:: 15613585
SPECIMEN QUALITY:: ADEQUATE

## 2022-12-16 MED ORDER — CIPROFLOXACIN HCL 500 MG PO TABS: 500.0000 mg | ORAL_TABLET | Freq: Two times a day (BID) | ORAL | 0 refills | Status: AC

## 2022-12-16 NOTE — Addendum Note (Signed)
Addended by: Christy Sartorius on: 12/16/2022 08:40 AM   Modules accepted: Orders

## 2022-12-22 ENCOUNTER — Other Ambulatory Visit: Payer: Self-pay | Admitting: Physician Assistant

## 2022-12-22 DIAGNOSIS — M0579 Rheumatoid arthritis with rheumatoid factor of multiple sites without organ or systems involvement: Secondary | ICD-10-CM

## 2022-12-23 NOTE — Telephone Encounter (Signed)
Last Fill: 09/04/2022  Labs: 10/16/2022 CBC and CMP WNL.   Total cholesterol is elevated-268.  Triglycerides are elevated and continue to trend up.  LDL is elevated-146.   TB Gold: 07/12/2022 Neg    Next Visit: 02/24/2023  Last Visit: 09/23/2022  [id number] arthritis involving multiple sites with positive rheumatoid factor   Current Dose per office note 09/23/2022: Rinvoq 15 mg 1 tablet by mouth daily   Okay to refill Rinvoq?

## 2023-02-10 NOTE — Progress Notes (Unsigned)
Office Visit Note  Patient: Vickie [name]             Date of Birth: April 27, 1965           MRN: [id number]             PCP: [place], Betty G, MD Referring: [place], Betty G, MD Visit Date: 02/24/2023 Occupation: @GUAROCC @  Subjective:  Medication monitoring   History of Present Illness: Vickie [name] is a 57 y.o. female with history of seropositive rheumatoid arthritis. Patient remains on rinvoq 15 mg 1 tablet by mouth daily.  She is tolerating Rinvoq without any side effects and has not had any recent gaps in therapy.  She denies any signs or symptoms of a rheumatoid arthritis flare.  She has not been experiencing any morning stiffness, nocturnal pain, or difficulty with ADLs.  She denies any recent or recurrent infections.  She denies any new medical conditions.  Patient reports that she was started on Lipitor a few months ago and has been tolerating it without any side effects.  She would like to have her cholesterol rechecked today.    Activities of Daily Living:  Patient reports morning stiffness for 0 minute.   Patient Denies nocturnal pain.  Difficulty dressing/grooming: Denies Difficulty climbing stairs: Denies Difficulty getting out of chair: Denies Difficulty using hands for taps, buttons, cutlery, and/or writing: Reports  Review of Systems  Constitutional:  Negative for fatigue.  HENT:  Negative for mouth sores and mouth dryness.   Eyes:  Negative for dryness.  Respiratory:  Negative for shortness of breath.   Cardiovascular:  Negative for chest pain and palpitations.  Gastrointestinal:  Negative for blood in stool, constipation and diarrhea.  Endocrine: Negative for increased urination.  Genitourinary:  Negative for involuntary urination.  Musculoskeletal:  Negative for joint pain, gait problem, joint pain, joint swelling, myalgias, muscle weakness, morning stiffness, muscle tenderness and myalgias.  Skin:  Negative for color change, rash, hair loss and sensitivity to  sunlight.  Allergic/Immunologic: Negative for susceptible to infections.  Neurological:  Negative for dizziness and headaches.  Hematological:  Negative for swollen glands.  Psychiatric/Behavioral:  Negative for depressed mood and sleep disturbance. The patient is not nervous/anxious.     PMFS History:  Patient Active Problem List   Diagnosis Date Noted   Routine general medical examination at a health care facility 03/20/2022   Subconjunctival hemorrhage of left eye 03/20/2022   Hyperlipidemia 01/08/2022   Anxiety 10/12/2020   PVC (premature ventricular contraction) 01/02/2017   Snoring 01/02/2017   High risk medication use 10/24/2016   Rheumatoid arthritis (HCC) 08/07/2016   Hypertension, essential 08/07/2016    Past Medical History:  Diagnosis Date   Anxiety 10/12/2020   Arthritis    RA Dx at age 34   Bell's palsy    Chicken pox    Hypertension    PVC (premature ventricular contraction) 01/02/2017   Rheumatoid arthritis (HCC)    Snoring 01/02/2017    Family History  Problem Relation Age of Onset   Rheum arthritis Mother    Diabetes Neg Hx    Hypertension Neg Hx    Hyperlipidemia Neg Hx    Stroke Neg Hx    Colon cancer Neg Hx    Past Surgical History:  Procedure Laterality Date   BREAST BIOPSY Left    unsure of time  - ?2016 in [place]   CESAREAN SECTION     FOOT SURGERY Left 04/2019   Social History   Social  History Narrative   Not on file   Immunization History  Administered Date(s) Administered   Influenza Inj Mdck Quad Pf 11/28/2016   Influenza Split 12/17/2019   Influenza,inj,Quad PF,6+ Mos 12/05/2015, 12/19/2017, 11/12/2018   Influenza-Unspecified 12/06/2020   PFIZER Comirnaty(Gray Top)Covid-19 Tri-Sucrose Vaccine 05/31/2020   PFIZER(Purple Top)SARS-COV-2 Vaccination 05/10/2019, 06/07/2019, 11/12/2019   Tdap 09/26/2014   Zoster Recombinant(Shingrix) 11/12/2018, 03/25/2019     Objective: Vital Signs: BP 115/80 (BP Location: Left Arm, Patient  Position: Sitting, Cuff Size: Normal)   Pulse 76   Resp 14   Ht 5\' 2"  (1.575 m)   Wt 138 lb (62.6 kg)   BMI 25.24 kg/m    Physical Exam Vitals and nursing note reviewed.  Constitutional:      Appearance: She is well-developed.  HENT:     Head: Normocephalic and atraumatic.  Eyes:     Conjunctiva/sclera: Conjunctivae normal.  Cardiovascular:     Rate and Rhythm: Normal rate and regular rhythm.     Heart sounds: Normal heart sounds.  Pulmonary:     Effort: Pulmonary effort is normal.     Breath sounds: Normal breath sounds.  Abdominal:     General: Bowel sounds are normal.     Palpations: Abdomen is soft.  Musculoskeletal:     Cervical back: Normal range of motion.  Lymphadenopathy:     Cervical: No cervical adenopathy.  Skin:    General: Skin is warm and dry.     Capillary Refill: Capillary refill takes less than 2 seconds.  Neurological:     Mental Status: She is alert and oriented to person, place, and time.  Psychiatric:        Behavior: Behavior normal.      Musculoskeletal Exam: C-spine, thoracic spine, lumbar spine have good range of motion.  Shoulder joints and elbow joints have good range of motion.  Limited range of motion of both wrist joints with synovial thickening.  Ulnar deviation and synovial thickening of all MCP joints.  No active synovitis noted.  Hip joints have good range of motion no groin pain.  Knee joints have good range of motion no warmth or effusion.  Ankle joints have good range of motion without tenderness or joint swelling.  CDAI Exam: CDAI Score: -- Patient Global: 10 / 100; Provider Global: 10 / 100 Swollen: --; Tender: -- Joint Exam 02/24/2023   No joint exam has been documented for this visit   There is currently no information documented on the homunculus. Go to the Rheumatology activity and complete the homunculus joint exam.  Investigation: No additional findings.  Imaging: No results found.  Recent Labs: Lab Results   Component Value Date   WBC 5.9 10/16/2022   HGB 14.1 10/16/2022   PLT 379 10/16/2022   NA 138 10/16/2022   K 4.2 10/16/2022   CL 103 10/16/2022   CO2 26 10/16/2022   GLUCOSE 78 10/16/2022   BUN 14 10/16/2022   CREATININE 0.65 10/16/2022   BILITOT 1.1 10/16/2022   ALKPHOS 79 01/02/2017   AST 28 10/16/2022   ALT 17 10/16/2022   PROT 7.5 10/16/2022   ALBUMIN 4.3 01/02/2017   CALCIUM 10.3 10/16/2022   GFRAA 116 08/25/2020   QFTBGOLDPLUS NEGATIVE 07/12/2022    Speciality Comments: Rinvoq September 2021, inadequate response to methotrexate and Humira  Procedures:  No procedures performed Allergies: Latex and Sulfa antibiotics   Assessment / Plan:     Visit Diagnoses: Rheumatoid arthritis involving multiple sites with positive rheumatoid factor (HCC) -  Positive RF, positive anti-CCP, elevated ESR, erosive disease with nodulosis: She has no joint tenderness or synovitis on examination today.  She has not had any signs or symptoms of a rheumatoid arthritis flare.  She has clinically been doing well taking Rinvoq 15 mg 1 tablet by mouth daily.  She is tolerating Rinvoq without any side effects and has not had any recent interruptions in therapy.  No recent or recurrent infections.  She has not been experiencing any morning stiffness or nocturnal pain.  No active inflammation noted at this time.  No rheumatoid nodules noted.  Patient will remain on Rinvoq 15 mg 1 tablet by mouth daily.  She was advised to notify us if she develops signs or symptoms of a flare.  She will follow-up in the office in 5 months or sooner if needed.  High risk medication use - Rinvoq 15 mg 1 tablet by mouth daily. D/c Rasuvo due to clinically doing well.  CBC and CMP updated on 10/16/22. Orders for CBC and CMP released today.   TB gold negative on 07/12/22.  Lipid panel updated on 10/16/22.  No recent or recurrent infections.  Discussed the importance of holding rinvoq if she develops signs or symptoms of an  infection and to resume once the infection has completely cleared.  - Plan: COMPLETE METABOLIC PANEL WITH GFR, CBC with Differential/Platelet  Elevated cholesterol - Patient was started on Lipitor a few months ago.  She has been tolerating Lipitor without any side effects.  Plan to recheck lipid panel today.  Plan: Lipid panel  Rheumatoid nodule, right elbow (HCC) - Resolved  Rheumatoid nodule, left elbow (HCC) - Resolved  S/P bunionectomy - bunionectomy by Dr. Victorino Dike in 2021.  She is not experiencing any increased discomfort in her feet at this time.  She is wearing proper fitting shoes.  Calcaneal spur of both feet: She is wearing proper fitting shoes.  No evidence of Achilles tendinitis or plantar fasciitis.  Chronic midline low back pain without sciatica: She is not experiencing any increased discomfort in her lower back at this time.  She has no symptoms of sciatica.  History of hypertension: Blood pressure was 115/80 today in the office.  Anxiety  Orders: Orders Placed This Encounter  Procedures   COMPLETE METABOLIC PANEL WITH GFR   CBC with Differential/Platelet   Lipid panel   No orders of the defined types were placed in this encounter.    Follow-Up Instructions: Return in about 5 months (around 07/25/2023) for Rheumatoid arthritis.   Gearldine Bienenstock, PA-C  Note - This record has been created using Dragon software.  Chart creation errors have been sought, but may not always  have been located. Such creation errors do not reflect on  the standard of medical care.

## 2023-02-24 ENCOUNTER — Encounter: Payer: Self-pay | Admitting: Physician Assistant

## 2023-02-24 ENCOUNTER — Ambulatory Visit: Payer: 59 | Attending: Physician Assistant | Admitting: Physician Assistant

## 2023-02-24 VITALS — BP 115/80 | HR 76 | Resp 14 | Ht 62.0 in | Wt 138.0 lb

## 2023-02-24 DIAGNOSIS — E7800 Pure hypercholesterolemia, unspecified: Secondary | ICD-10-CM

## 2023-02-24 DIAGNOSIS — Z79899 Other long term (current) drug therapy: Secondary | ICD-10-CM | POA: Diagnosis not present

## 2023-02-24 DIAGNOSIS — F419 Anxiety disorder, unspecified: Secondary | ICD-10-CM

## 2023-02-24 DIAGNOSIS — Z9889 Other specified postprocedural states: Secondary | ICD-10-CM

## 2023-02-24 DIAGNOSIS — M7732 Calcaneal spur, left foot: Secondary | ICD-10-CM

## 2023-02-24 DIAGNOSIS — M06321 Rheumatoid nodule, right elbow: Secondary | ICD-10-CM | POA: Diagnosis not present

## 2023-02-24 DIAGNOSIS — M545 Low back pain, unspecified: Secondary | ICD-10-CM

## 2023-02-24 DIAGNOSIS — M0579 Rheumatoid arthritis with rheumatoid factor of multiple sites without organ or systems involvement: Secondary | ICD-10-CM | POA: Diagnosis not present

## 2023-02-24 DIAGNOSIS — G8929 Other chronic pain: Secondary | ICD-10-CM

## 2023-02-24 DIAGNOSIS — Z8679 Personal history of other diseases of the circulatory system: Secondary | ICD-10-CM

## 2023-02-24 DIAGNOSIS — M7731 Calcaneal spur, right foot: Secondary | ICD-10-CM

## 2023-02-24 DIAGNOSIS — M06322 Rheumatoid nodule, left elbow: Secondary | ICD-10-CM

## 2023-02-24 NOTE — Patient Instructions (Signed)
Standing Labs We placed an order today for your standing lab work.   Please have your standing labs drawn at the end of March and every 3 months   Please have your labs drawn 2 weeks prior to your appointment so that the provider can discuss your lab results at your appointment, if possible.  Please note that you may see your imaging and lab results in MyChart before we have reviewed them. We will contact you once all results are reviewed. Please allow our office up to 72 hours to thoroughly review all of the results before contacting the office for clarification of your results.  WALK-IN LAB HOURS  Monday through Thursday from 8:00 am -12:30 pm and 1:00 pm-5:00 pm and Friday from 8:00 am-12:00 pm.  Patients with office visits requiring labs will be seen before walk-in labs.  You may encounter longer than normal wait times. Please allow additional time. Wait times may be shorter on  Monday and Thursday afternoons.  We do not book appointments for walk-in labs. We appreciate your patience and understanding with our staff.   Labs are drawn by [facility]. Please bring your co-pay at the time of your lab draw.  You may receive a bill from [facility] for your lab work.  Please note if you are on Hydroxychloroquine and and an order has been placed for a Hydroxychloroquine level,  you will need to have it drawn 4 hours or more after your last dose.  If you wish to have your labs drawn at another location, please call the office 24 hours in advance so we can fax the orders.  The office is located at [address], [address], [place], [place] [address]   If you have any questions regarding directions or hours of operation,  please call [phone].   As a reminder, please drink plenty of water prior to coming for your lab work. Thanks!

## 2023-02-25 LAB — COMPLETE METABOLIC PANEL WITH GFR
AG Ratio: 1.5 (calc) (ref 1.0–2.5)
ALT: 16 U/L (ref 6–29)
AST: 22 U/L (ref 10–35)
Albumin: 4.4 g/dL (ref 3.6–5.1)
Alkaline phosphatase (APISO): 77 U/L (ref 37–153)
BUN: 13 mg/dL (ref 7–25)
CO2: 29 mmol/L (ref 20–32)
Calcium: 9.8 mg/dL (ref 8.6–10.4)
Chloride: 105 mmol/L (ref 98–110)
Creat: 0.67 mg/dL (ref 0.50–1.03)
Globulin: 3 g/dL (ref 1.9–3.7)
Glucose, Bld: 95 mg/dL (ref 65–99)
Potassium: 3.9 mmol/L (ref 3.5–5.3)
Sodium: 140 mmol/L (ref 135–146)
Total Bilirubin: 0.7 mg/dL (ref 0.2–1.2)
Total Protein: 7.4 g/dL (ref 6.1–8.1)
eGFR: 101 mL/min/{1.73_m2} (ref >=60)

## 2023-02-25 LAB — CBC WITH DIFFERENTIAL/PLATELET
Absolute Lymphocytes: 899 {cells}/uL (ref 850–3900)
Absolute Monocytes: 500 {cells}/uL (ref 200–950)
Basophils Absolute: 21 {cells}/uL (ref 0–200)
Basophils Relative: 0.5 %
Eosinophils Absolute: 42 {cells}/uL (ref 15–500)
Eosinophils Relative: 1 %
HCT: 40.6 % (ref 35.0–45.0)
Hemoglobin: 13.3 g/dL (ref 11.7–15.5)
MCH: 31.4 pg (ref 27.0–33.0)
MCHC: 32.8 g/dL (ref 32.0–36.0)
MCV: 95.8 fL (ref 80.0–100.0)
MPV: 10.1 fL (ref 7.5–12.5)
Monocytes Relative: 11.9 %
Neutro Abs: 2738 {cells}/uL (ref 1500–7800)
Neutrophils Relative %: 65.2 %
Platelets: 324 10*3/uL (ref 140–400)
RBC: 4.24 10*6/uL (ref 3.80–5.10)
RDW: 12.7 % (ref 11.0–15.0)
Total Lymphocyte: 21.4 %
WBC: 4.2 10*3/uL (ref 3.8–10.8)

## 2023-02-25 LAB — LIPID PANEL
Cholesterol: 179 mg/dL (ref <200)
HDL: 76 mg/dL (ref >=50)
LDL Cholesterol (Calc): 77 mg/dL
Non-HDL Cholesterol (Calc): 103 mg/dL (ref <130)
Total CHOL/HDL Ratio: 2.4 (calc) (ref <5.0)
Triglycerides: 163 mg/dL — ABNORMAL HIGH (ref <150)

## 2023-02-25 NOTE — Progress Notes (Signed)
CBC and CMP WNL  Triglycerides is borderline elevated but has improved.  Rest of lipid panel WNL.  Continue Lipitor as prescribed.

## 2023-03-09 ENCOUNTER — Other Ambulatory Visit: Payer: Self-pay | Admitting: Physician Assistant

## 2023-03-09 DIAGNOSIS — M0579 Rheumatoid arthritis with rheumatoid factor of multiple sites without organ or systems involvement: Secondary | ICD-10-CM

## 2023-03-10 ENCOUNTER — Other Ambulatory Visit: Payer: Self-pay | Admitting: Nurse Practitioner

## 2023-03-10 ENCOUNTER — Other Ambulatory Visit (HOSPITAL_COMMUNITY)
Admission: RE | Admit: 2023-03-10 | Discharge: 2023-03-10 | Disposition: A | Discharge status: Home / Self Care | Payer: 59 | Source: Ambulatory Visit | Attending: Nurse Practitioner | Admitting: Nurse Practitioner

## 2023-03-10 DIAGNOSIS — Z124 Encounter for screening for malignant neoplasm of cervix: Secondary | ICD-10-CM | POA: Insufficient documentation

## 2023-03-10 NOTE — Telephone Encounter (Signed)
 Last Fill: 12/23/2022  Labs: 02/24/2023 CBC and CMP WNL Triglycerides is borderline elevated but has improved.  Rest of lipid panel WNL.  TB Gold: 07/12/2022 Neg    Next Visit: 07/24/2023  Last Visit: 02/24/2023  DX: Rheumatoid arthritis involving multiple sites with positive rheumatoid factor   Current Dose per office note 02/24/2023: Rinvoq  15 mg 1 tablet by mouth daily.   Okay to refill Rinvoq ?

## 2023-03-12 LAB — CYTOLOGY - PAP
Adequacy: ABSENT
Comment: NEGATIVE
Diagnosis: NEGATIVE
High risk HPV: NEGATIVE

## 2023-03-24 ENCOUNTER — Other Ambulatory Visit: Payer: Self-pay | Admitting: Family Medicine

## 2023-03-24 DIAGNOSIS — Z1231 Encounter for screening mammogram for malignant neoplasm of breast: Secondary | ICD-10-CM

## 2023-04-24 ENCOUNTER — Ambulatory Visit
Admission: RE | Admit: 2023-04-24 | Discharge: 2023-04-24 | Disposition: A | Discharge status: Home / Self Care | Payer: 59 | Source: Ambulatory Visit | Attending: Family Medicine | Admitting: Family Medicine

## 2023-04-24 DIAGNOSIS — Z1231 Encounter for screening mammogram for malignant neoplasm of breast: Secondary | ICD-10-CM

## 2023-04-29 ENCOUNTER — Other Ambulatory Visit: Payer: Self-pay | Admitting: Family Medicine

## 2023-04-29 DIAGNOSIS — R928 Other abnormal and inconclusive findings on diagnostic imaging of breast: Secondary | ICD-10-CM

## 2023-05-07 ENCOUNTER — Ambulatory Visit
Admission: RE | Admit: 2023-05-07 | Discharge: 2023-05-07 | Disposition: A | Discharge status: Home / Self Care | Source: Ambulatory Visit | Attending: Family Medicine | Admitting: Family Medicine

## 2023-05-07 ENCOUNTER — Ambulatory Visit

## 2023-05-07 DIAGNOSIS — R928 Other abnormal and inconclusive findings on diagnostic imaging of breast: Secondary | ICD-10-CM

## 2023-05-23 ENCOUNTER — Telehealth: Payer: Self-pay | Admitting: *Deleted

## 2023-05-23 NOTE — Telephone Encounter (Signed)
 Received fax from [facility] stating they have been trying to reach patient to refill Rinvoq. They have been unsuccessful in reaching patient. Call back number is [phone]. Left message to advise patient to contact the pharmacy to set up shipment.

## 2023-05-27 NOTE — Progress Notes (Signed)
 ACUTE VISIT No chief complaint on file.  HPI: [name] is a 58 y.o. female with a PMHx significant for PVCs, HTN, rheumatoid arthritis, HLD, and anxiety, who is here today complaining of a possible UTI.   Review of Systems See other pertinent positives and negatives in HPI.  Current Outpatient Medications on File Prior to Visit  Medication Sig Dispense Refill   amLODipine (NORVASC) 5 MG tablet TAKE 1 TABLET BY MOUTH DAILY 90 tablet 1   atorvastatin (LIPITOR) 20 MG tablet Take 1 tablet (20 mg total) by mouth daily. 90 tablet 3   famotidine (PEPCID) 40 MG tablet Take 1 tablet (40 mg total) by mouth daily. 30 tablet 2   folic acid (FOLVITE) 1 MG tablet Take 2 tablets (2 mg total) by mouth daily. 180 tablet 0   nitrofurantoin, macrocrystal-monohydrate, (MACROBID) 100 MG capsule Take 1 capsule (100 mg total) by mouth 2 (two) times daily. (Patient not taking: Reported on 02/24/2023) 10 capsule 0   nitrofurantoin, macrocrystal-monohydrate, (MACROBID) 100 MG capsule Take 1 capsule (100 mg total) by mouth 2 (two) times daily. (Patient not taking: Reported on 02/24/2023) 10 capsule 0   RINVOQ 15 MG TB24 TAKE 1 TABLET BY MOUTH DAILY 30 tablet 2   No current facility-administered medications on file prior to visit.    Past Medical History:  Diagnosis Date   Anxiety 10/12/2020   Arthritis    RA Dx at age 73   Bell's palsy    Chicken pox    Hypertension    PVC (premature ventricular contraction) 01/02/2017   Rheumatoid arthritis (HCC)    Snoring 01/02/2017   Allergies  Allergen Reactions   Latex    Sulfa Antibiotics     Social History   Socioeconomic History   Marital status: Married    Spouse name: Not on file   Number of children: Not on file   Years of education: Not on file   Highest education level: Bachelor's degree (e.g., BA, AB, BS)  Occupational History   Not on file  Tobacco Use   Smoking status: Never    Passive exposure: Never   Smokeless tobacco: Never   Vaping Use   Vaping status: Never Used  Substance and Sexual Activity   Alcohol use: Yes    Comment: occ   Drug use: No   Sexual activity: Not on file  Other Topics Concern   Not on file  Social History Narrative   Not on file   Social Drivers of Health   Financial Resource Strain: Low Risk  (01/05/2022)   Overall Financial Resource Strain (CARDIA)    Difficulty of Paying Living Expenses: Not very hard  Food Insecurity: No Food Insecurity (01/05/2022)   Hunger Vital Sign    Worried About Running Out of Food in the Last Year: Never true    Ran Out of Food in the Last Year: Never true  Transportation Needs: No Transportation Needs (01/05/2022)   PRAPARE - Administrator, Civil Service (Medical): No    Lack of Transportation (Non-Medical): No  Physical Activity: Insufficiently Active (01/05/2022)   Exercise Vital Sign    Days of Exercise per Week: 3 days    Minutes of Exercise per Session: 40 min  Stress: No Stress Concern Present (01/05/2022)   [employer] of Occupational Health - Occupational Stress Questionnaire    Feeling of Stress : Only a little  Social Connections: Socially Integrated (01/05/2022)   Social Connection and Isolation Panel [NHANES]  Frequency of Communication with Friends and Family: Twice a week    Frequency of Social Gatherings with Friends and Family: Twice a week    Attends Religious Services: 1 to 4 times per year    Active Member of [employer] or Organizations: Yes    Attends Banker Meetings: 1 to 4 times per year    Marital Status: Married    There were no vitals filed for this visit. There is no height or weight on file to calculate BMI.  Physical Exam  ASSESSMENT AND PLAN:  Vickie [name] was seen today for a possible UTI.   There are no diagnoses linked to this encounter.  No follow-ups on file.  I, Rolla Etienne Wierda, acting as a scribe for Auriana Scalia [place], MD., have documented all relevant documentation on the  behalf of Vickie Labell [place], MD, as directed by  Vickie Hallisey [place], MD while in the presence of Vickie Struthers [place], MD.   I, Maximus Hoffert [place], MD, have reviewed all documentation for this visit. The documentation on 05/27/23 for the exam, diagnosis, procedures, and orders are all accurate and complete.  Jasher Barkan G. [place], MD  [facility]. [place] office.  Discharge Instructions   None

## 2023-05-28 ENCOUNTER — Encounter: Payer: Self-pay | Admitting: Family Medicine

## 2023-05-28 ENCOUNTER — Ambulatory Visit (INDEPENDENT_AMBULATORY_CARE_PROVIDER_SITE_OTHER): Admitting: Family Medicine

## 2023-05-28 VITALS — BP 120/84 | HR 76 | Temp 98.2°F | Resp 16 | Ht 62.0 in | Wt 139.4 lb

## 2023-05-28 DIAGNOSIS — N390 Urinary tract infection, site not specified: Secondary | ICD-10-CM

## 2023-05-28 DIAGNOSIS — R319 Hematuria, unspecified: Secondary | ICD-10-CM

## 2023-05-28 DIAGNOSIS — E785 Hyperlipidemia, unspecified: Secondary | ICD-10-CM

## 2023-05-28 DIAGNOSIS — R300 Dysuria: Secondary | ICD-10-CM | POA: Diagnosis not present

## 2023-05-28 DIAGNOSIS — I10 Essential (primary) hypertension: Secondary | ICD-10-CM

## 2023-05-28 DIAGNOSIS — R7303 Prediabetes: Secondary | ICD-10-CM | POA: Diagnosis not present

## 2023-05-28 DIAGNOSIS — R310 Gross hematuria: Secondary | ICD-10-CM

## 2023-05-28 LAB — POC URINALSYSI DIPSTICK (AUTOMATED)
Bilirubin, UA: NEGATIVE
Blood, UA: POSITIVE
Glucose, UA: NEGATIVE
Ketones, UA: NEGATIVE
Nitrite, UA: NEGATIVE
Protein, UA: POSITIVE — AB
Spec Grav, UA: 1.015 (ref 1.010–1.025)
Urobilinogen, UA: 0.2 U/dL
pH, UA: 7 (ref 5.0–8.0)

## 2023-05-28 LAB — POCT GLYCOSYLATED HEMOGLOBIN (HGB A1C): Hemoglobin A1C: 5.4 % (ref 4.0–5.6)

## 2023-05-28 MED ORDER — CIPROFLOXACIN HCL 500 MG PO TABS: 500.0000 mg | ORAL_TABLET | Freq: Two times a day (BID) | ORAL | 0 refills | Status: AC

## 2023-05-28 MED ORDER — AMLODIPINE BESYLATE 5 MG PO TABS: 5.0000 mg | ORAL_TABLET | Freq: Every day | ORAL | 3 refills | Status: DC

## 2023-05-28 NOTE — Assessment & Plan Note (Signed)
 Hemoglobin A1c improved, it went from 5.7 to 5.4. Encouraged a healthy lifestyle for diabetes prevention.

## 2023-05-28 NOTE — Patient Instructions (Addendum)
 A few things to remember from today's visit:  Dysuria - Plan: POCT Urinalysis Dipstick (Automated), Culture, Urine  Hypertension, essential  Prediabetes  Urinary tract infection with hematuria, site unspecified - Plan: ciprofloxacin (CIPRO) 500 MG tablet  Gross hematuria - Plan: Ambulatory referral to Urology  Recurrent UTI - Plan: Ambulatory referral to Urology  Because you saw blood in urine and recurrent urine infections we are arranging appt with urologist.  Adequate fluid intake, avoid holding urine for long hours, and over the counter Vit C OR cranberry capsules might help.  Today we will treat empirically with antibiotic, which we might need to change when urine culture comes back depending of bacteria susceptibility.  Seek immediate medical attention if severe abdominal pain, vomiting, fever/chills, or worsening symptoms. F/U if symptomatic are not any better after 2-3 days of antibiotic treatment.  If you need refills for medications you take chronically, please call your pharmacy. Do not use My Chart to request refills or for acute issues that need immediate attention. If you send a my chart message, it may take a few days to be addressed, specially if I am not in the office.  Please be sure medication list is accurate. If a new problem present, please set up appointment sooner than planned today.

## 2023-05-28 NOTE — Assessment & Plan Note (Signed)
 DBP elevated today, reporting lower BP readings at home. Continue amlodipine 5 mg daily and low-salt diet. She is having BMP regularly at her rheumatologist office.

## 2023-05-28 NOTE — Assessment & Plan Note (Signed)
 Continue atorvastatin 20 mg daily. Fasting lipid panel can be added to her labs at her rheumatologist's office in 01/2024.

## 2023-05-29 LAB — URINE CULTURE
MICRO NUMBER:: 16279459
SPECIMEN QUALITY:: ADEQUATE

## 2023-05-30 ENCOUNTER — Encounter: Payer: Self-pay | Admitting: Family Medicine

## 2023-06-15 ENCOUNTER — Other Ambulatory Visit: Payer: Self-pay | Admitting: Rheumatology

## 2023-06-15 DIAGNOSIS — M0579 Rheumatoid arthritis with rheumatoid factor of multiple sites without organ or systems involvement: Secondary | ICD-10-CM

## 2023-06-15 DIAGNOSIS — Z9225 Personal history of immunosupression therapy: Secondary | ICD-10-CM

## 2023-06-15 DIAGNOSIS — Z111 Encounter for screening for respiratory tuberculosis: Secondary | ICD-10-CM

## 2023-06-15 DIAGNOSIS — Z79899 Other long term (current) drug therapy: Secondary | ICD-10-CM

## 2023-06-16 NOTE — Telephone Encounter (Signed)
 Last Fill: 03/10/2023  Labs: 02/24/2023 CBC and CMP WNL Triglycerides is borderline elevated but has improved.  Rest of lipid panel WNL.  TB Gold: 07/12/2022 Neg    Next Visit: 07/24/2023  Last Visit: 02/24/2023  DX: Rheumatoid arthritis involving multiple sites with positive rheumatoid factor   Current Dose per office note 02/24/2023: Rinvoq  15 mg 1 tablet by mouth daily.   Left message to advise patient she is due to update labs.  Okay to refill Rinvoq ?

## 2023-06-20 ENCOUNTER — Other Ambulatory Visit: Payer: Self-pay | Admitting: *Deleted

## 2023-06-20 DIAGNOSIS — Z9225 Personal history of immunosupression therapy: Secondary | ICD-10-CM

## 2023-06-20 DIAGNOSIS — Z111 Encounter for screening for respiratory tuberculosis: Secondary | ICD-10-CM

## 2023-06-20 DIAGNOSIS — Z79899 Other long term (current) drug therapy: Secondary | ICD-10-CM

## 2023-06-23 NOTE — Progress Notes (Signed)
Glucose is 114.  Rest of CMP WNL.  CBC WNL.

## 2023-06-24 LAB — COMPREHENSIVE METABOLIC PANEL WITH GFR
AG Ratio: 1.4 (calc) (ref 1.0–2.5)
ALT: 15 U/L (ref 6–29)
AST: 20 U/L (ref 10–35)
Albumin: 4.2 g/dL (ref 3.6–5.1)
Alkaline phosphatase (APISO): 78 U/L (ref 37–153)
BUN: 12 mg/dL (ref 7–25)
CO2: 28 mmol/L (ref 20–32)
Calcium: 9.5 mg/dL (ref 8.6–10.4)
Chloride: 107 mmol/L (ref 98–110)
Creat: 0.52 mg/dL (ref 0.50–1.03)
Globulin: 2.9 g/dL (ref 1.9–3.7)
Glucose, Bld: 114 mg/dL — ABNORMAL HIGH (ref 65–99)
Potassium: 4.2 mmol/L (ref 3.5–5.3)
Sodium: 141 mmol/L (ref 135–146)
Total Bilirubin: 1 mg/dL (ref 0.2–1.2)
Total Protein: 7.1 g/dL (ref 6.1–8.1)
eGFR: 108 mL/min/{1.73_m2} (ref >=60)

## 2023-06-24 LAB — QUANTIFERON-TB GOLD PLUS
Mitogen-NIL: 9.35 [IU]/mL
NIL: 0.02 [IU]/mL
QuantiFERON-TB Gold Plus: NEGATIVE
TB1-NIL: 0 [IU]/mL
TB2-NIL: 0 [IU]/mL

## 2023-06-24 LAB — CBC WITH DIFFERENTIAL/PLATELET
Absolute Lymphocytes: 1130 {cells}/uL (ref 850–3900)
Absolute Monocytes: 361 {cells}/uL (ref 200–950)
Basophils Absolute: 21 {cells}/uL (ref 0–200)
Basophils Relative: 0.5 %
Eosinophils Absolute: 21 {cells}/uL (ref 15–500)
Eosinophils Relative: 0.5 %
HCT: 40.3 % (ref 35.0–45.0)
Hemoglobin: 13.4 g/dL (ref 11.7–15.5)
MCH: 30.7 pg (ref 27.0–33.0)
MCHC: 33.3 g/dL (ref 32.0–36.0)
MCV: 92.2 fL (ref 80.0–100.0)
MPV: 10.5 fL (ref 7.5–12.5)
Monocytes Relative: 8.6 %
Neutro Abs: 2667 {cells}/uL (ref 1500–7800)
Neutrophils Relative %: 63.5 %
Platelets: 313 10*3/uL (ref 140–400)
RBC: 4.37 10*6/uL (ref 3.80–5.10)
RDW: 13 % (ref 11.0–15.0)
Total Lymphocyte: 26.9 %
WBC: 4.2 10*3/uL (ref 3.8–10.8)

## 2023-06-24 NOTE — Progress Notes (Signed)
 TB gold negative

## 2023-07-08 ENCOUNTER — Other Ambulatory Visit: Payer: Self-pay | Admitting: Family Medicine

## 2023-07-08 DIAGNOSIS — I10 Essential (primary) hypertension: Secondary | ICD-10-CM

## 2023-07-09 ENCOUNTER — Other Ambulatory Visit: Payer: Self-pay | Admitting: Physician Assistant

## 2023-07-09 DIAGNOSIS — M0579 Rheumatoid arthritis with rheumatoid factor of multiple sites without organ or systems involvement: Secondary | ICD-10-CM

## 2023-07-09 NOTE — Telephone Encounter (Signed)
 Last Fill: 06/16/2023 (30 day supply)  Labs: 06/20/2023 Glucose is 114. Rest of CMP WNL CBC WNL  TB Gold: 06/20/2023 Neg    Next Visit: 08/05/2023  Last Visit: 02/24/2023  UJ:[id number] arthritis involving multiple sites with positive rheumatoid factor   Current Dose per office note 02/24/2024: Rinvoq  15 mg 1 tablet by mouth daily.   Okay to refill Rinvoq ?

## 2023-07-17 ENCOUNTER — Other Ambulatory Visit: Payer: Self-pay | Admitting: Physician Assistant

## 2023-07-17 ENCOUNTER — Telehealth: Admitting: Nurse Practitioner

## 2023-07-17 ENCOUNTER — Other Ambulatory Visit: Payer: Self-pay | Admitting: Family Medicine

## 2023-07-17 DIAGNOSIS — H00015 Hordeolum externum left lower eyelid: Secondary | ICD-10-CM

## 2023-07-17 DIAGNOSIS — E785 Hyperlipidemia, unspecified: Secondary | ICD-10-CM

## 2023-07-17 MED ORDER — NEOMYCIN-POLYMYXIN-DEXAMETH 3.5-10000-0.1 OP SUSP: 1.0000 [drp] | OPHTHALMIC | 0 refills | Status: DC

## 2023-07-17 MED ORDER — ERYTHROMYCIN 5 MG/GM OP OINT: TOPICAL_OINTMENT | OPHTHALMIC | 0 refills | Status: AC

## 2023-07-17 NOTE — Telephone Encounter (Signed)
 Copied from CRM [id number]. Topic: Clinical - Medication Refill >> Jul 17, 2023 10:08 AM [facility] T wrote: Medication: atorvastatin  (LIPITOR) 20 MG tablet - 90 day supply  Has the patient contacted their pharmacy? Yes (Agent: If no, request that the patient contact the pharmacy for the refill. If patient does not wish to contact the pharmacy document the reason why and proceed with request.) (Agent: If yes, when and what did the pharmacy advise?)  This is the patient's preferred pharmacy:    [facility] - [place], [place] - [address] [place] [address] Phone: [phone] Fax: [phone]  Is this the correct pharmacy for this prescription? Yes If no, delete pharmacy and type the correct one.   Has the prescription been filled recently? Yes  Is the patient out of the medication? No  Has the patient been seen for an appointment in the last year OR does the patient have an upcoming appointment? Yes  Can we respond through MyChart? Yes  Agent: Please be advised that Rx refills may take up to 3 business days. We ask that you follow-up with your pharmacy.

## 2023-07-17 NOTE — Patient Instructions (Addendum)
  Vickie [name], thank you for joining Collins Dean, NP for today's virtual visit.  While this provider is not your primary care provider (PCP), if your PCP is located in our provider database this encounter information will be shared with them immediately following your visit.   A [facility] MyChart account gives you access to today's visit and all your visits, tests, and labs performed at [facility] " click here if you don't have a [facility] MyChart account or go to [email or web address]  Consent: (Patient) Vickie [name] provided verbal consent for this virtual visit at the beginning of the encounter.  Current Medications:  Current Outpatient Medications:    neomycin-polymyxin b -dexamethasone (MAXITROL) 3.5-10000-0.1 SUSP, Place 1 drop into the left eye every 4 (four) hours., Disp: 5 mL, Rfl: 0   amLODipine  (NORVASC ) 5 MG tablet, TAKE 1 TABLET(5 MG) BY MOUTH DAILY, Disp: 90 tablet, Rfl: 3   atorvastatin  (LIPITOR) 20 MG tablet, Take 1 tablet (20 mg total) by mouth daily., Disp: 90 tablet, Rfl: 3   famotidine  (PEPCID ) 40 MG tablet, Take 1 tablet (40 mg total) by mouth daily., Disp: 30 tablet, Rfl: 2   folic acid  (FOLVITE ) 1 MG tablet, Take 2 tablets (2 mg total) by mouth daily., Disp: 180 tablet, Rfl: 0   RINVOQ  15 MG TB24, TAKE 1 TABLET BY MOUTH DAILY, Disp: 90 tablet, Rfl: 0   Medications ordered in this encounter:  Meds ordered this encounter  Medications   neomycin-polymyxin b -dexamethasone (MAXITROL) 3.5-10000-0.1 SUSP    Sig: Place 1 drop into the left eye every 4 (four) hours.    Dispense:  5 mL    Refill:  0    Supervising Provider:   Corine Dice [[id number]]     *If you need refills on other medications prior to your next appointment, please contact your pharmacy*  Follow-Up: Call back or seek an in-person evaluation if the symptoms worsen or if the condition fails to improve as anticipated.  [facility] [phone]  Other  Instructions Continue to apply warm compresses to affected eye If no improvement in 7 to 10 days follow-up with the eye specialist   If you have been instructed to have an in-person evaluation today at a local Urgent Care facility, please use the link below. It will take you to a list of all of our available [facility], including address, phone number and hours of operation. Please do not delay care.  [facility]  If you or a family member do not have a primary care provider, use the link below to schedule a visit and establish care. When you choose a [facility] primary care physician or advanced practice provider, you gain a long-term partner in health. Find a Primary Care Provider  Learn more about [facility] in-office and virtual care options: [facility] - Get Care Now

## 2023-07-17 NOTE — Progress Notes (Signed)
 Virtual Visit Consent   Vickie [name], you are scheduled for a virtual visit with a [facility] provider today. Just as with appointments in the office, your consent must be obtained to participate. Your consent will be active for this visit and any virtual visit you may have with one of our providers in the next 365 days. If you have a MyChart account, a copy of this consent can be sent to you electronically.  As this is a virtual visit, video technology does not allow for your provider to perform a traditional examination. This may limit your provider's ability to fully assess your condition. If your provider identifies any concerns that need to be evaluated in person or the need to arrange testing (such as labs, EKG, etc.), we will make arrangements to do so. Although advances in technology are sophisticated, we cannot ensure that it will always work on either your end or our end. If the connection with a video visit is poor, the visit may have to be switched to a telephone visit. With either a video or telephone visit, we are not always able to ensure that we have a secure connection.  By engaging in this virtual visit, you consent to the provision of healthcare and authorize for your insurance to be billed (if applicable) for the services provided during this visit. Depending on your insurance coverage, you may receive a charge related to this service.  I need to obtain your verbal consent now. Are you willing to proceed with your visit today? Vickie [name] has provided verbal consent on 07/17/2023 for a virtual visit (video or telephone). Vickie Dean, Vickie [name]  Date: 07/17/2023 10:38 AM   Virtual Visit via Video Note   I, Vickie [name], connected with  Vickie [name]  ([id number], 05/21/1965) on 07/17/23 at 10:30 AM EDT by a video-enabled telemedicine application and verified that I am speaking with the correct person using two identifiers.  Location: Patient: Virtual Visit Location Patient:  Home Provider: Virtual Visit Location Provider: Home Office   I discussed the limitations of evaluation and management by telemedicine and the availability of in person appointments. The patient expressed understanding and agreed to proceed.    History of Present Illness: Vickie [name] is a 58 y.o. who identifies as a female who was assigned female at birth, and is being seen today for left eye stye.   Vickie [name] has been experiencing left lower eyelid pain and small painful nodule that has persisted over the past week despite frequent warm compresses to eye. Her vision is not affected.   Problems:  Patient Active Problem List   Diagnosis Date Noted   Prediabetes 05/28/2023   Routine general medical examination at a health care facility 03/20/2022   Subconjunctival hemorrhage of left eye 03/20/2022   Hyperlipidemia 01/08/2022   Anxiety 10/12/2020   PVC (premature ventricular contraction) 01/02/2017   Snoring 01/02/2017   High risk medication use 10/24/2016   Rheumatoid arthritis (HCC) 08/07/2016   Hypertension, essential 08/07/2016    Allergies:  Allergies  Allergen Reactions   Latex    Sulfa Antibiotics    Medications:  Current Outpatient Medications:    neomycin-polymyxin b -dexamethasone (MAXITROL) 3.5-10000-0.1 SUSP, Place 1 drop into the left eye every 4 (four) hours., Disp: 5 mL, Rfl: 0   amLODipine  (NORVASC ) 5 MG tablet, TAKE 1 TABLET(5 MG) BY MOUTH DAILY, Disp: 90 tablet, Rfl: 3   atorvastatin  (LIPITOR) 20 MG tablet, Take 1 tablet (20 mg total) by  mouth daily., Disp: 90 tablet, Rfl: 3   famotidine  (PEPCID ) 40 MG tablet, Take 1 tablet (40 mg total) by mouth daily., Disp: 30 tablet, Rfl: 2   folic acid  (FOLVITE ) 1 MG tablet, Take 2 tablets (2 mg total) by mouth daily., Disp: 180 tablet, Rfl: 0   RINVOQ  15 MG TB24, TAKE 1 TABLET BY MOUTH DAILY, Disp: 90 tablet, Rfl: 0  Observations/Objective: Patient is well-developed, well-nourished in no acute distress.  Resting  comfortably at home.  Head is normocephalic, atraumatic.  No labored breathing.  Speech is clear and coherent with logical content.  Patient is alert and oriented at baseline.    Assessment and Plan: 1. Hordeolum externum of left lower eyelid (Primary) - neomycin-polymyxin b -dexamethasone (MAXITROL) 3.5-10000-0.1 SUSP; Place 1 drop into the left eye every 4 (four) hours.  Dispense: 5 mL; Refill: 0  Continue to apply warm compresses to affected eye If no improvement in 7 to 10 days follow-up with the eye specialist  Follow Up Instructions: I discussed the assessment and treatment plan with the patient. The patient was provided an opportunity to ask questions and all were answered. The patient agreed with the plan and demonstrated an understanding of the instructions.  A copy of instructions were sent to the patient via MyChart unless otherwise noted below.    The patient was advised to call back or seek an in-person evaluation if the symptoms worsen or if the condition fails to improve as anticipated.    Vickie Lusher W Alda Gaultney, Vickie [name]

## 2023-07-18 MED ORDER — ATORVASTATIN CALCIUM 20 MG PO TABS
20.0000 mg | ORAL_TABLET | Freq: Every day | ORAL | 3 refills | Status: AC
Start: 2023-07-18 — End: ?

## 2023-07-23 NOTE — Progress Notes (Signed)
 Office Visit Note  Patient: Vickie [name]             Date of Birth: 03/18/1965           MRN: [id number]             PCP: [place], Betty G, MD Referring: [place], Betty G, MD Visit Date: 08/05/2023 Occupation: @GUAROCC @  Subjective:  Medication management  History of Present Illness: Vickie [name] is a 58 y.o. female with seropositive rheumatoid arthritis.  Patient states she has been taking Rinvoq  on a regular basis without any interruption.  She states she has had few episodes of urinary tract infection and she will be seeing urologist.  She states she did not have to stop Rinvoq .  She denies any joint stiffness or joint swelling.  She states she started taking Lipitor and her lipid levels are better.  She has not been experiencing any side effects from Lipitor.    Activities of Daily Living:  Patient reports morning stiffness for 0 minute.   Patient Denies nocturnal pain.  Difficulty dressing/grooming: Denies Difficulty climbing stairs: Denies Difficulty getting out of chair: Denies Difficulty using hands for taps, buttons, cutlery, and/or writing: Denies  Review of Systems  Constitutional:  Negative for fatigue.  HENT:  Negative for mouth sores and mouth dryness.   Eyes:  Negative for dryness.  Respiratory:  Negative for shortness of breath.   Cardiovascular:  Negative for chest pain and palpitations.  Gastrointestinal:  Negative for blood in stool, constipation and diarrhea.  Endocrine: Negative for increased urination.  Genitourinary:  Positive for involuntary urination.  Musculoskeletal:  Negative for joint pain, gait problem, joint pain, joint swelling, myalgias, muscle weakness, morning stiffness, muscle tenderness and myalgias.  Skin:  Negative for color change, rash, hair loss and sensitivity to sunlight.  Allergic/Immunologic: Negative for susceptible to infections.  Neurological:  Negative for dizziness and headaches.  Hematological:  Negative for swollen glands.   Psychiatric/Behavioral:  Negative for depressed mood and sleep disturbance. The patient is nervous/anxious.     PMFS History:  Patient Active Problem List   Diagnosis Date Noted   Prediabetes 05/28/2023   Routine general medical examination at a health care facility 03/20/2022   Subconjunctival hemorrhage of left eye 03/20/2022   Hyperlipidemia 01/08/2022   Anxiety 10/12/2020   PVC (premature ventricular contraction) 01/02/2017   Snoring 01/02/2017   High risk medication use 10/24/2016   Rheumatoid arthritis (HCC) 08/07/2016   Hypertension, essential 08/07/2016    Past Medical History:  Diagnosis Date   Anxiety 10/12/2020   Arthritis    RA Dx at age 40   Bell's palsy    Chicken pox    Hypertension    PVC (premature ventricular contraction) 01/02/2017   Rheumatoid arthritis (HCC)    Snoring 01/02/2017    Family History  Problem Relation Age of Onset   Rheum arthritis Mother    Diabetes Neg Hx    Hypertension Neg Hx    Hyperlipidemia Neg Hx    Stroke Neg Hx    Colon cancer Neg Hx    Past Surgical History:  Procedure Laterality Date   BREAST BIOPSY Left    unsure of time  - ?2016 in [place]    CESAREAN SECTION     FOOT SURGERY Left 04/2019   Social History   Social History Narrative   Not on file   Immunization History  Administered Date(s) Administered   Influenza Inj Mdck Quad Pf 11/28/2016   Influenza Split  12/17/2019   Influenza,inj,Quad PF,6+ Mos 12/05/2015, 12/19/2017, 11/12/2018   Influenza-Unspecified 12/06/2020   PFIZER Comirnaty(Gray Top)Covid-19 Tri-Sucrose Vaccine 05/31/2020   PFIZER(Purple Top)SARS-COV-2 Vaccination 05/10/2019, 06/07/2019, 11/12/2019   Tdap 09/26/2014   Zoster Recombinant(Shingrix) 11/12/2018, 03/25/2019     Objective: Vital Signs: BP 104/69 (BP Location: Left Arm, Patient Position: Sitting, Cuff Size: Normal)   Pulse 78   Resp 14   Ht 5\' 2"  (1.575 m)   Wt 140 lb (63.5 kg)   BMI 25.61 kg/m    Physical Exam Vitals and  nursing note reviewed.  Constitutional:      Appearance: She is well-developed.  HENT:     Head: Normocephalic and atraumatic.  Eyes:     Conjunctiva/sclera: Conjunctivae normal.  Cardiovascular:     Rate and Rhythm: Normal rate and regular rhythm.     Heart sounds: Normal heart sounds.  Pulmonary:     Effort: Pulmonary effort is normal.     Breath sounds: Normal breath sounds.  Abdominal:     General: Bowel sounds are normal.     Palpations: Abdomen is soft.  Musculoskeletal:     Cervical back: Normal range of motion.  Lymphadenopathy:     Cervical: No cervical adenopathy.  Skin:    General: Skin is warm and dry.     Capillary Refill: Capillary refill takes less than 2 seconds.  Neurological:     Mental Status: She is alert and oriented to person, place, and time.  Psychiatric:        Behavior: Behavior normal.      Musculoskeletal Exam: She had good range of motion of the cervical spine.  She had good range of motion of the thoracic and lumbar spine.  She had no difficulty reaching her toes.  Shoulders and elbows with good range of motion.  She had limited range of motion of her bilateral wrist joints with synovial thickening.  No synovitis was noted.  She had bilateral MCP thickening and ulnar deviation without any active synovitis.  Hip joints and knee joints in good range of motion without any warmth swelling or effusion.  There was no tenderness over ankles or MTPs.  CDAI Exam: CDAI Score: -- Patient Global: 10 / 100; Provider Global: 10 / 100 Swollen: --; Tender: -- Joint Exam 08/05/2023   No joint exam has been documented for this visit   There is currently no information documented on the homunculus. Go to the Rheumatology activity and complete the homunculus joint exam.  Investigation: No additional findings.  Imaging: No results found.  Recent Labs: Lab Results  Component Value Date   WBC 4.2 06/20/2023   HGB 13.4 06/20/2023   PLT 313 06/20/2023   NA  141 06/20/2023   K 4.2 06/20/2023   CL 107 06/20/2023   CO2 28 06/20/2023   GLUCOSE 114 (H) 06/20/2023   BUN 12 06/20/2023   CREATININE 0.52 06/20/2023   BILITOT 1.0 06/20/2023   ALKPHOS 79 01/02/2017   AST 20 06/20/2023   ALT 15 06/20/2023   PROT 7.1 06/20/2023   ALBUMIN 4.3 01/02/2017   CALCIUM  9.5 06/20/2023   GFRAA 116 08/25/2020   QFTBGOLDPLUS NEGATIVE 06/20/2023    Speciality Comments: Rinvoq  September 2021, inadequate response to methotrexate  and Humira   Procedures:  No procedures performed Allergies: Latex and Sulfa antibiotics   Assessment / Plan:     Visit Diagnoses: Rheumatoid arthritis involving multiple sites with positive rheumatoid factor (HCC) - Positive RF, positive anti-CCP, elevated ESR, erosive disease with nodulosis: She  had no synovitis on the examination.  Synovial thickening was noted in multiple joints.  She has been tolerating Rinvoq  15 mg p.o. daily without any side effects.  High risk medication use - Rinvoq  15 mg 1 tablet by mouth daily. D/c Rasuvo  due to clinically doing well.  CBC and CMP were normal on June 20, 2023.  TB Gold was negative on June 20, 2023.  She was advised to get repeat labs in July and then every 3 months to monitor for drug toxicity.  TB Gold should be checked annually.  Information reimmunization was placed in the AVS.  She was advised to hold Rinvoq  if she develops an infection resume of the infection resolves.  Information regarding FDA blackbox warning on major adverse cardiovascular events including the risk of stroke, cardiovascular death, DVT and pulmonary embolism was also discussed.  Annual skin examination to screen for skin cancer was advised.  Use of sunscreen and sun protection was advised.  Patient has been seeing a dermatologist on the annual basis.  Elevated cholesterol -February 24, 2023 triglycerides 163 and LDL was 77.  Plan: Lipid panel  Rheumatoid nodule, right elbow (HCC) - Resolved  Rheumatoid nodule, left  elbow (HCC) - Resolved  S/P bunionectomy - bunionectomy by Dr. Rosebud Confer in 2021.  Calcaneal spur of both feet  Chronic midline low back pain without sciatica  History of hypertension  Orders: Orders Placed This Encounter  Procedures   DG BONE DENSITY (DXA)   Lipid panel   No orders of the defined types were placed in this encounter.    Follow-Up Instructions: Return in about 5 months (around 01/05/2024) for Rheumatoid arthritis.   Nicholas Bari, MD  Note - This record has been created using Animal nutritionist.  Chart creation errors have been sought, but may not always  have been located. Such creation errors do not reflect on  the standard of medical care.

## 2023-07-24 ENCOUNTER — Ambulatory Visit: Payer: 59 | Admitting: Rheumatology

## 2023-07-25 ENCOUNTER — Telehealth: Payer: Self-pay | Admitting: Pharmacist

## 2023-07-25 NOTE — Telephone Encounter (Signed)
 Submitted a Prior Authorization RENEWAL request to [employer] for RINVOQ  via CoverMyMeds. Will update once we receive a response.  Key: [id number]

## 2023-07-28 NOTE — Telephone Encounter (Signed)
 Received notification from [employer] regarding a prior authorization for RINVOQ . Authorization has been APPROVED from 07/25/23 to 07/24/2024. Approval letter sent to scan center.  Authorization #  [id number]  Geraldene Kleine, PharmD, MPH, BCPS, CPP Clinical Pharmacist (Rheumatology and Pulmonology)

## 2023-08-05 ENCOUNTER — Encounter: Payer: Self-pay | Admitting: Rheumatology

## 2023-08-05 ENCOUNTER — Ambulatory Visit: Attending: Rheumatology | Admitting: Rheumatology

## 2023-08-05 VITALS — BP 104/69 | HR 78 | Resp 14 | Ht 62.0 in | Wt 140.0 lb

## 2023-08-05 DIAGNOSIS — M0579 Rheumatoid arthritis with rheumatoid factor of multiple sites without organ or systems involvement: Secondary | ICD-10-CM

## 2023-08-05 DIAGNOSIS — E7800 Pure hypercholesterolemia, unspecified: Secondary | ICD-10-CM

## 2023-08-05 DIAGNOSIS — M7732 Calcaneal spur, left foot: Secondary | ICD-10-CM

## 2023-08-05 DIAGNOSIS — Z79899 Other long term (current) drug therapy: Secondary | ICD-10-CM | POA: Diagnosis not present

## 2023-08-05 DIAGNOSIS — G8929 Other chronic pain: Secondary | ICD-10-CM

## 2023-08-05 DIAGNOSIS — Z780 Asymptomatic menopausal state: Secondary | ICD-10-CM

## 2023-08-05 DIAGNOSIS — M7731 Calcaneal spur, right foot: Secondary | ICD-10-CM

## 2023-08-05 DIAGNOSIS — M06321 Rheumatoid nodule, right elbow: Secondary | ICD-10-CM

## 2023-08-05 DIAGNOSIS — M06322 Rheumatoid nodule, left elbow: Secondary | ICD-10-CM

## 2023-08-05 DIAGNOSIS — Z8679 Personal history of other diseases of the circulatory system: Secondary | ICD-10-CM

## 2023-08-05 DIAGNOSIS — Z9889 Other specified postprocedural states: Secondary | ICD-10-CM

## 2023-08-05 DIAGNOSIS — Z13820 Encounter for screening for osteoporosis: Secondary | ICD-10-CM

## 2023-08-05 DIAGNOSIS — M545 Low back pain, unspecified: Secondary | ICD-10-CM

## 2023-08-05 NOTE — Patient Instructions (Addendum)
 Standing Labs We placed an order today for your standing lab work.   Please have your standing labs drawn in  July and every 3 months  Please have your labs drawn 2 weeks prior to your appointment so that the provider can discuss your lab results at your appointment, if possible.  Please note that you may see your imaging and lab results in MyChart before we have reviewed them. We will contact you once all results are reviewed. Please allow our office up to 72 hours to thoroughly review all of the results before contacting the office for clarification of your results.  WALK-IN LAB HOURS  Monday through Thursday from 8:00 am -12:30 pm and 1:00 pm-4:00 pm and Friday from 8:00 am-12:00 pm.  Patients with office visits requiring labs will be seen before walk-in labs.  You may encounter longer than normal wait times. Please allow additional time. Wait times may be shorter on  Monday and Thursday afternoons.  We do not book appointments for walk-in labs. We appreciate your patience and understanding with our staff.   Labs are drawn by [facility]. Please bring your co-pay at the time of your lab draw.  You may receive a bill from [facility] for your lab work.  Please note if you are on Hydroxychloroquine and and an order has been placed for a Hydroxychloroquine level,  you will need to have it drawn 4 hours or more after your last dose.  If you wish to have your labs drawn at another location, please call the office 24 hours in advance so we can fax the orders.  The office is located at [address], [address], [place], [place] [address]   If you have any questions regarding directions or hours of operation,  please call [phone].   As a reminder, please drink plenty of water prior to coming for your lab work. Thanks!   Vaccines You are taking a medication(s) that can suppress your immune system.  The following immunizations are recommended: Flu annually Covid-19  Td/Tdap (tetanus,  diphtheria, pertussis) every 10 years Pneumonia (Prevnar 15 then Pneumovax 23 at least 1 year apart.  Alternatively, can take Prevnar 20 without needing additional dose) Shingrix: 2 doses from 4 weeks to 6 months apart  Please check with your PCP to make sure you are up to date.   If you have signs or symptoms of an infection or start antibiotics: First, call your PCP for workup of your infection. Hold your medication through the infection, until you complete your antibiotics, and until symptoms resolve if you take the following: Injectable medication (Actemra, Benlysta, Cimzia, Cosentyx, Enbrel, Humira , Kevzara, Orencia, Remicade, Simponi, Stelara, Taltz, Tremfya) Methotrexate  Leflunomide (Arava) Mycophenolate (Cellcept) Xeljanz, Olumiant, or Rinvoq    Because you are taking Xeljanz, Rinvoq , or Olumiant, it is very important to know that this class of medications has a FDA BLACK BOX WARNING for major adverse cardiovascular events (MACE), thrombosis, mortality (including sudden cardiovascular death), serious infections, and lymphomas. MACE is defined as cardiovascular death, myocardial infarction, and stroke. Thrombosis includes deep venous thrombosis (DVT), pulmonary embolism (PE), and arterial thrombosis. If you are a current or former smoker, you are at higher risk for MACE.   Get an annual skin examination to screen for skin cancer while you are on Rinvoq .  Please use sunscreen and sun protection.

## 2023-08-18 ENCOUNTER — Other Ambulatory Visit: Payer: Self-pay | Admitting: Family Medicine

## 2023-08-18 ENCOUNTER — Ambulatory Visit (INDEPENDENT_AMBULATORY_CARE_PROVIDER_SITE_OTHER): Admitting: Family Medicine

## 2023-08-18 VITALS — BP 128/80 | HR 80 | Temp 97.9°F | Resp 16 | Ht 62.0 in | Wt 141.1 lb

## 2023-08-18 DIAGNOSIS — J988 Other specified respiratory disorders: Secondary | ICD-10-CM

## 2023-08-18 DIAGNOSIS — R059 Cough, unspecified: Secondary | ICD-10-CM

## 2023-08-18 DIAGNOSIS — H6992 Unspecified Eustachian tube disorder, left ear: Secondary | ICD-10-CM | POA: Diagnosis not present

## 2023-08-18 MED ORDER — FLUTICASONE PROPIONATE 50 MCG/ACT NA SUSP: 1.0000 | Freq: Two times a day (BID) | NASAL | 0 refills | Status: DC

## 2023-08-18 MED ORDER — BENZONATATE 100 MG PO CAPS: 100.0000 mg | ORAL_CAPSULE | Freq: Two times a day (BID) | ORAL | 0 refills | Status: AC | PRN

## 2023-08-18 MED ORDER — AMOXICILLIN-POT CLAVULANATE 875-125 MG PO TABS: 1.0000 | ORAL_TABLET | Freq: Two times a day (BID) | ORAL | 0 refills | Status: AC

## 2023-08-18 NOTE — Patient Instructions (Addendum)
 A few things to remember from today's visit:  Dysfunction of left eustachian tube - Plan: fluticasone (FLONASE) 50 MCG/ACT nasal spray  Respiratory tract infection - Plan: fluticasone (FLONASE) 50 MCG/ACT nasal spray, amoxicillin -clavulanate (AUGMENTIN) 875-125 MG tablet  Cough, unspecified type - Plan: benzonatate  (TESSALON ) 100 MG capsule Lung auscultation today negative, you could hold on antibiotic for 2 to 3 days and started if cough is not elevated. Over-the-counter plain Mucinex may help. Nasal saline irrigations as needed throughout the day. Flonase nasal spray at bedtime for 10 to 14 days. Popping your ears a few times throughout the day may help with left ear fullness.  If cough has not greatly improved in 1 to 2 weeks, please call so we can arrange a chest x-ray.  If you need refills for medications you take chronically, please call your pharmacy. Do not use My Chart to request refills or for acute issues that need immediate attention. If you send a my chart message, it may take a few days to be addressed, specially if I am not in the office.  Please be sure medication list is accurate. If a new problem present, please set up appointment sooner than planned today.

## 2023-08-18 NOTE — Progress Notes (Signed)
 ACUTE VISIT Chief Complaint  Patient presents with   Cough    X 10 days, sometimes productive with yellow phlegm, was clear in the beginning. Using mucinex, dayquil & nyquil without relief.    HPI: Ms.Zoee LOISE Zentz is a 58 y.o. female, who is here today complaining of a productive cough ongoing 10 days, as described above.  Associated symptoms include nasal congestion, rhinorrhea, left ear fulness, and yellow sputum production.   Cough This is a new problem. The current episode started 1 to 4 weeks ago. The problem has been unchanged. The problem occurs every few hours. The cough is Productive of sputum. Associated symptoms include ear congestion, nasal congestion, postnasal drip and rhinorrhea. Pertinent negatives include no chest pain, chills, ear pain, fever, headaches, heartburn, hemoptysis, myalgias, rash, sore throat, shortness of breath, sweats, weight loss or wheezing. Nothing aggravates the symptoms. There is no history of asthma or environmental allergies.   Has been taking Mucinex and Cold & Flu ([employer] brand), with no improvement   No known exposure prior to onset of sx.  She notes that she was the first to experience symptoms and her husband also has a cough now.   Review of Systems  Constitutional:  Negative for chills, fever and weight loss.  HENT:  Positive for postnasal drip and rhinorrhea. Negative for ear pain and sore throat.   Respiratory:  Positive for cough. Negative for hemoptysis, shortness of breath and wheezing.   Cardiovascular:  Negative for chest pain.  Gastrointestinal:  Negative for abdominal pain, heartburn and nausea.  Genitourinary:  Negative for decreased urine volume, dysuria and hematuria.  Musculoskeletal:  Negative for myalgias.  Skin:  Negative for rash.  Allergic/Immunologic: Negative for environmental allergies.  Neurological:  Negative for syncope, weakness and headaches.  See other pertinent positives and negatives in HPI.  Current  Outpatient Medications on File Prior to Visit  Medication Sig Dispense Refill   amLODipine  (NORVASC ) 5 MG tablet TAKE 1 TABLET(5 MG) BY MOUTH DAILY 90 tablet 3   atorvastatin  (LIPITOR) 20 MG tablet Take 1 tablet (20 mg total) by mouth daily. 90 tablet 3   erythromycin  ophthalmic ointment Apply 1 cm ribbon to affected eye three times daily for 5 days 3.5 g 0   famotidine  (PEPCID ) 40 MG tablet Take 1 tablet (40 mg total) by mouth daily. 30 tablet 2   folic acid  (FOLVITE ) 1 MG tablet Take 2 tablets (2 mg total) by mouth daily. 180 tablet 0   Multiple Vitamin (MULTIVITAMIN PO) Take by mouth.     RINVOQ  15 MG TB24 TAKE 1 TABLET BY MOUTH DAILY 90 tablet 0   No current facility-administered medications on file prior to visit.   Past Medical History:  Diagnosis Date   Anxiety 10/12/2020   Arthritis    RA Dx at age 39   Bell's palsy    Chicken pox    Hypertension    PVC (premature ventricular contraction) 01/02/2017   Rheumatoid arthritis (HCC)    Sleep apnea    Snoring 01/02/2017   Allergies  Allergen Reactions   Latex    Sulfa Antibiotics    Social History   Socioeconomic History   Marital status: Married    Spouse name: Not on file   Number of children: Not on file   Years of education: Not on file   Highest education level: Bachelor's degree (e.g., BA, AB, BS)  Occupational History   Not on file  Tobacco Use   Smoking status: Never  Passive exposure: Never   Smokeless tobacco: Never  Vaping Use   Vaping status: Never Used  Substance and Sexual Activity   Alcohol use: Yes    Comment: occ   Drug use: No   Sexual activity: Not on file  Other Topics Concern   Not on file  Social History Narrative   Not on file   Social Drivers of Health   Financial Resource Strain: Low Risk  (08/18/2023)   Overall Financial Resource Strain (CARDIA)    Difficulty of Paying Living Expenses: Not hard at all  Food Insecurity: No Food Insecurity (08/18/2023)   Hunger Vital Sign     Worried About Running Out of Food in the Last Year: Never true    Ran Out of Food in the Last Year: Never true  Transportation Needs: No Transportation Needs (08/18/2023)   PRAPARE - Administrator, Civil Service (Medical): No    Lack of Transportation (Non-Medical): No  Physical Activity: Insufficiently Active (08/18/2023)   Exercise Vital Sign    Days of Exercise per Week: 3 days    Minutes of Exercise per Session: 30 min  Stress: No Stress Concern Present (08/18/2023)   [employer] of Occupational Health - Occupational Stress Questionnaire    Feeling of Stress: Only a little  Social Connections: Socially Integrated (08/18/2023)   Social Connection and Isolation Panel    Frequency of Communication with Friends and Family: Twice a week    Frequency of Social Gatherings with Friends and Family: Once a week    Attends Religious Services: 1 to 4 times per year    Active Member of [employer] or Organizations: Yes    Attends Banker Meetings: More than 4 times per year    Marital Status: Married   Vitals:   08/18/23 1446  BP: 128/80  Pulse: 80  Resp: 16  Temp: 97.9 F (36.6 C)  SpO2: 97%   Body mass index is 25.81 kg/m.  Physical Exam Vitals and nursing note reviewed.  Constitutional:      General: She is not in acute distress.    Appearance: She is well-developed. She is not ill-appearing.  HENT:     Head: Normocephalic and atraumatic.     Right Ear: External ear normal.     Left Ear: External ear normal. Tympanic membrane is not erythematous.     Ears:     Comments: Excess cerumen in both canals, could not see right TM. Right TM seen partially.     Nose: Congestion and rhinorrhea present.     Right Turbinates: Enlarged.     Left Turbinates: Enlarged.     Right Sinus: No maxillary sinus tenderness or frontal sinus tenderness.     Left Sinus: No maxillary sinus tenderness or frontal sinus tenderness.   Eyes:     Conjunctiva/sclera: Conjunctivae  normal.    Cardiovascular:     Rate and Rhythm: Normal rate and regular rhythm.     Heart sounds: No murmur heard. Pulmonary:     Effort: Pulmonary effort is normal. No respiratory distress.     Breath sounds: Normal breath sounds. No stridor.   Musculoskeletal:     Cervical back: No edema or erythema. No muscular tenderness.  Lymphadenopathy:     Head:     Right side of head: No submandibular adenopathy.     Left side of head: No submandibular adenopathy.     Cervical: No cervical adenopathy.   Skin:    General: Skin  is warm.     Findings: No erythema or rash.   Neurological:     General: No focal deficit present.     Mental Status: She is alert and oriented to person, place, and time.     Gait: Gait normal.   Psychiatric:        Mood and Affect: Mood and affect normal.   ASSESSMENT AND PLAN: Ms. ANNIEBELL BEDORE was seen today for a cough.  Dysfunction of left eustachian tube We discussed diagnosis. I do not recommend OTC decongestant due to history of hypertension. Auto inflation maneuvers a few times throughout the day recommended. Flonase nasal spray may help.  -     Fluticasone Propionate; Place 1 spray into both nostrils 2 (two) times daily.  Dispense: 16 g; Refill: 0  Respiratory tract infection Lung auscultation negative today. I do not think antibiotic treatment is needed at this time but because immunosuppressive state (On Rinvoq ), recommend starting Augmentin in 2 to 3 days if symptoms are not greatly improved. Flonase nasal spray daily at bedtime for 10 to 14 days then as needed. Instructed about warning signs.  -     Fluticasone Propionate; Place 1 spray into both nostrils 2 (two) times daily.  Dispense: 16 g; Refill: 0 -     Amoxicillin -Pot Clavulanate; Take 1 tablet by mouth 2 (two) times daily for 7 days.  Dispense: 14 tablet; Refill: 0  Cough, unspecified type Explained that cough can last a few days and even weeks after acute respiratory tract  infection. I do not think imaging is needed at this time but needs to be considered if cough is not greatly improved in 1 to 2 weeks. Benzonatate  and plain Mucinex for symptomatic treatment.  -     Benzonatate ; Take 1 capsule (100 mg total) by mouth 2 (two) times daily as needed for up to 10 days.  Dispense: 20 capsule; Refill: 0   Return if symptoms worsen or fail to improve, for keep next appointment. SABRA LILLETTE Vernell Gailen, acting as a scribe for Dannelle Rhymes [place], MD., have documented all relevant documentation on the behalf of Sinjin Amero [place], MD, as directed by   while in the presence of Janice Bodine [place], MD.  I, Ranisha Allaire [place], MD, have reviewed all documentation for this visit. The documentation on 08/18/23 for the exam, diagnosis, procedures, and orders are all accurate and complete.  Amilah Greenspan G. [place], MD  [facility] [place] office.

## 2023-09-18 ENCOUNTER — Telehealth: Payer: Self-pay

## 2023-09-18 NOTE — Telephone Encounter (Signed)
 Contacted patient to advise that [facility] has been trying to contact her, patient states she is aware but has plenty on hand an will get it taken care of tomorrow.

## 2023-10-24 ENCOUNTER — Other Ambulatory Visit: Payer: Self-pay | Admitting: Family Medicine

## 2023-10-24 DIAGNOSIS — I10 Essential (primary) hypertension: Secondary | ICD-10-CM

## 2023-10-28 ENCOUNTER — Telehealth: Payer: Self-pay

## 2023-10-28 NOTE — Telephone Encounter (Signed)
 Received a fax from [facility] regarding the patient's Rinvoq . Fax states they have been trying to reach the patient regarding her Rinvoq . Contacted the patient and provided her with the phone number for [facility]. Patient states she will call [facility] back.

## 2023-11-10 ENCOUNTER — Encounter: Payer: Self-pay | Admitting: Rheumatology

## 2023-11-10 NOTE — Telephone Encounter (Signed)
I called patient, patient will call [facility] to schedule DEXA.

## 2023-11-14 ENCOUNTER — Encounter: Payer: Self-pay | Admitting: Rheumatology

## 2023-11-22 ENCOUNTER — Other Ambulatory Visit: Payer: Self-pay | Admitting: Physician Assistant

## 2023-11-22 DIAGNOSIS — M0579 Rheumatoid arthritis with rheumatoid factor of multiple sites without organ or systems involvement: Secondary | ICD-10-CM

## 2023-11-24 NOTE — Telephone Encounter (Signed)
 Last Fill: 07/09/2023  Labs:  06/20/2023 Glucose is 114. Rest of CMP WNL   CBC WNL   TB Gold: 06/20/2023 Neg  Next Visit:02/10/2024  Last Visit: 08/05/2023  IK:Myzlfjunpi arthritis involving multiple sites with positive rheumatoid factor   Current Dose per office note 08/05/2023: Rinvoq  15 mg 1 tablet by mouth daily   Left message to advise patient she is due to update her labs.   Okay to refill Rinvoq ?

## 2023-12-15 ENCOUNTER — Other Ambulatory Visit: Payer: Self-pay | Admitting: *Deleted

## 2023-12-15 DIAGNOSIS — E7800 Pure hypercholesterolemia, unspecified: Secondary | ICD-10-CM

## 2023-12-15 DIAGNOSIS — Z79899 Other long term (current) drug therapy: Secondary | ICD-10-CM

## 2023-12-15 LAB — COMPREHENSIVE METABOLIC PANEL WITH GFR
AG Ratio: 1.6 (calc) (ref 1.0–2.5)
ALT: 17 U/L (ref 6–29)
AST: 22 U/L (ref 10–35)
Albumin: 4.5 g/dL (ref 3.6–5.1)
Alkaline phosphatase (APISO): 76 U/L (ref 37–153)
BUN: 13 mg/dL (ref 7–25)
CO2: 27 mmol/L (ref 20–32)
Calcium: 9.6 mg/dL (ref 8.6–10.4)
Chloride: 105 mmol/L (ref 98–110)
Creat: 0.67 mg/dL (ref 0.50–1.03)
Globulin: 2.9 g/dL (ref 1.9–3.7)
Glucose, Bld: 99 mg/dL (ref 65–99)
Potassium: 4.1 mmol/L (ref 3.5–5.3)
Sodium: 141 mmol/L (ref 135–146)
Total Bilirubin: 0.9 mg/dL (ref 0.2–1.2)
Total Protein: 7.4 g/dL (ref 6.1–8.1)
eGFR: 101 mL/min/1.73m2 (ref >=60)

## 2023-12-15 LAB — LIPID PANEL
Cholesterol: 182 mg/dL (ref <200)
HDL: 69 mg/dL (ref >=50)
LDL Cholesterol (Calc): 88 mg/dL
Non-HDL Cholesterol (Calc): 113 mg/dL (ref <130)
Total CHOL/HDL Ratio: 2.6 (calc) (ref <5.0)
Triglycerides: 155 mg/dL — ABNORMAL HIGH (ref <150)

## 2023-12-15 LAB — CBC WITH DIFFERENTIAL/PLATELET
Absolute Lymphocytes: 962 {cells}/uL (ref 850–3900)
Absolute Monocytes: 374 {cells}/uL (ref 200–950)
Basophils Absolute: 21 {cells}/uL (ref 0–200)
Basophils Relative: 0.5 %
Eosinophils Absolute: 29 {cells}/uL (ref 15–500)
Eosinophils Relative: 0.7 %
HCT: 41.6 % (ref 35.0–45.0)
Hemoglobin: 13.8 g/dL (ref 11.7–15.5)
MCH: 31.6 pg (ref 27.0–33.0)
MCHC: 33.2 g/dL (ref 32.0–36.0)
MCV: 95.2 fL (ref 80.0–100.0)
MPV: 10.6 fL (ref 7.5–12.5)
Monocytes Relative: 8.9 %
Neutro Abs: 2814 {cells}/uL (ref 1500–7800)
Neutrophils Relative %: 67 %
Platelets: 312 Thousand/uL (ref 140–400)
RBC: 4.37 Million/uL (ref 3.80–5.10)
RDW: 13.1 % (ref 11.0–15.0)
Total Lymphocyte: 22.9 %
WBC: 4.2 Thousand/uL (ref 3.8–10.8)

## 2023-12-16 ENCOUNTER — Ambulatory Visit: Payer: Self-pay | Admitting: Rheumatology

## 2023-12-16 ENCOUNTER — Ambulatory Visit: Payer: Self-pay | Admitting: Physician Assistant

## 2023-12-16 NOTE — Progress Notes (Signed)
 Triglycerides are elevated.  CBC and CMP normal.  Please forward results to her PCP.

## 2023-12-16 NOTE — Progress Notes (Signed)
 CBC and CMP WNL

## 2024-01-06 ENCOUNTER — Ambulatory Visit: Admitting: Rheumatology

## 2024-01-10 ENCOUNTER — Other Ambulatory Visit: Payer: Self-pay | Admitting: Rheumatology

## 2024-01-10 ENCOUNTER — Ambulatory Visit (HOSPITAL_BASED_OUTPATIENT_CLINIC_OR_DEPARTMENT_OTHER)
Admission: RE | Admit: 2024-01-10 | Discharge: 2024-01-10 | Disposition: A | Discharge status: Home / Self Care | Source: Ambulatory Visit | Attending: Rheumatology | Admitting: Rheumatology

## 2024-01-10 DIAGNOSIS — Z13820 Encounter for screening for osteoporosis: Secondary | ICD-10-CM | POA: Diagnosis present

## 2024-01-10 DIAGNOSIS — Z780 Asymptomatic menopausal state: Secondary | ICD-10-CM | POA: Diagnosis present

## 2024-01-10 DIAGNOSIS — M0579 Rheumatoid arthritis with rheumatoid factor of multiple sites without organ or systems involvement: Secondary | ICD-10-CM

## 2024-01-12 NOTE — Telephone Encounter (Signed)
 Last Fill: 11/24/2023 (30 day supply)  Labs: 12/15/2023 CBC and CMP WNL   TB Gold: 06/20/2023 negative    Next Visit: 02/10/2024  Last Visit: 08/05/2023  IK:Myzlfjunpi arthritis involving multiple sites with positive rheumatoid factor   Current Dose per office note on 08/05/2023: Rinvoq  15 mg 1 tablet by mouth daily.   Okay to refill Rinvoq ?

## 2024-01-13 ENCOUNTER — Encounter: Payer: Self-pay | Admitting: Rheumatology

## 2024-01-27 NOTE — Progress Notes (Unsigned)
 Office Visit Note  Patient: Vickie [name]             Date of Birth: 01-01-1966           MRN: [id number]             PCP: Jordan, Betty G, MD Referring: Jordan, Betty G, MD Visit Date: 02/10/2024 Occupation: Data Unavailable  Subjective:  Medication management  History of Present Illness: Vickie [name] is a 58 y.o. female with seropositive rheumatoid arthritis.  She returns today after her last visit in June 2025.  Patient denies having a flare of rheumatoid arthritis.  She states she has been tolerating Rinvoq  without any side effects or interruption.  She has been getting annual skin examination to screen for skin cancer.  She denies any joint stiffness or joint swelling.  She has been hiking 3 times a week and exercising on a regular basis.    Activities of Daily Living:  Patient reports morning stiffness for 0 minutes.   Patient Denies nocturnal pain.  Difficulty dressing/grooming: Denies Difficulty climbing stairs: Denies Difficulty getting out of chair: Denies Difficulty using hands for taps, buttons, cutlery, and/or writing: Denies  Review of Systems  Constitutional:  Negative for fatigue.  HENT:  Negative for mouth sores and mouth dryness.   Eyes:  Negative for dryness.  Respiratory:  Negative for shortness of breath.   Cardiovascular:  Negative for chest pain and palpitations.  Gastrointestinal:  Negative for blood in stool, constipation and diarrhea.  Endocrine: Negative for increased urination.  Genitourinary:  Negative for involuntary urination.  Musculoskeletal:  Negative for joint pain, gait problem, joint pain, joint swelling, muscle weakness, morning stiffness and muscle tenderness.  Skin:  Negative for color change, rash, hair loss and sensitivity to sunlight.  Allergic/Immunologic: Negative for susceptible to infections.  Neurological:  Negative for dizziness and headaches.  Hematological:  Negative for swollen glands.  Psychiatric/Behavioral:  Positive for  sleep disturbance. Negative for depressed mood. The patient is not nervous/anxious.     PMFS History:  Patient Active Problem List   Diagnosis Date Noted   Prediabetes 05/28/2023   Routine general medical examination at a health care facility 03/20/2022   Subconjunctival hemorrhage of left eye 03/20/2022   Hyperlipidemia 01/08/2022   Anxiety 10/12/2020   PVC (premature ventricular contraction) 01/02/2017   Snoring 01/02/2017   High risk medication use 10/24/2016   Rheumatoid arthritis (HCC) 08/07/2016   Hypertension, essential 08/07/2016    Past Medical History:  Diagnosis Date   Anxiety 10/12/2020   Arthritis    RA Dx at age 44   Bell's palsy    Chicken pox    Hypertension    PVC (premature ventricular contraction) 01/02/2017   Rheumatoid arthritis (HCC)    Sleep apnea    Snoring 01/02/2017    Family History  Problem Relation Age of Onset   Rheum arthritis Mother    Arthritis Mother    Diabetes Neg Hx    Hypertension Neg Hx    Hyperlipidemia Neg Hx    Stroke Neg Hx    Colon cancer Neg Hx    Past Surgical History:  Procedure Laterality Date   BREAST BIOPSY Left    unsure of time  - ?2016 in [place]    CESAREAN SECTION     FOOT SURGERY Left 04/2019   Social History   Tobacco Use   Smoking status: Never    Passive exposure: Never   Smokeless tobacco: Never  Vaping Use  Vaping status: Never Used  Substance Use Topics   Alcohol use: Yes    Comment: occ   Drug use: No   Social History   Social History Narrative   Not on file     Immunization History  Administered Date(s) Administered   Influenza Inj Mdck Quad Pf 11/28/2016   Influenza Split 12/17/2019   Influenza,inj,Quad PF,6+ Mos 12/05/2015, 12/19/2017, 11/12/2018   Influenza-Unspecified 12/06/2020   PFIZER Comirnaty(Gray Top)Covid-19 Tri-Sucrose Vaccine 05/31/2020   PFIZER(Purple Top)SARS-COV-2 Vaccination 05/10/2019, 06/07/2019, 11/12/2019   Tdap 09/26/2014   Zoster Recombinant(Shingrix)  11/12/2018, 03/25/2019     Objective: Vital Signs: BP 125/86   Pulse 73   Temp 98.4 F (36.9 C)   Resp 14   Ht 5' 2 (1.575 m)   Wt 138 lb (62.6 kg)   BMI 25.24 kg/m    Physical Exam Vitals and nursing note reviewed.  Constitutional:      Appearance: She is well-developed.  HENT:     Head: Normocephalic and atraumatic.  Eyes:     Conjunctiva/sclera: Conjunctivae normal.  Cardiovascular:     Rate and Rhythm: Normal rate and regular rhythm.     Heart sounds: Normal heart sounds.  Pulmonary:     Effort: Pulmonary effort is normal.     Breath sounds: Normal breath sounds.  Abdominal:     General: Bowel sounds are normal.     Palpations: Abdomen is soft.  Musculoskeletal:     Cervical back: Normal range of motion.  Skin:    General: Skin is warm and dry.     Capillary Refill: Capillary refill takes less than 2 seconds.  Neurological:     Mental Status: She is alert and oriented to person, place, and time.  Psychiatric:        Behavior: Behavior normal.      Musculoskeletal Exam: Cervical spine was in good range of motion.  There was no tenderness over thoracic or lumbar spine.  Shoulders and elbow joints were in good range of motion.  She had limited range of motion of bilateral wrist joints with synovial thickening.  Bilateral MCP thickening with ulnar deviation was noted.  She had no synovitis over MCPs or PIPs.  Hip joints and knee joints in good range of motion.  There was no tenderness over ankles or MTPs.  CDAI Exam: CDAI Score: -- Patient Global: --; Provider Global: -- Swollen: --; Tender: -- Joint Exam 02/10/2024   No joint exam has been documented for this visit   There is currently no information documented on the homunculus. Go to the Rheumatology activity and complete the homunculus joint exam.  Investigation: No additional findings.  Imaging: No results found.   Recent Labs: Lab Results  Component Value Date   WBC 4.2 12/15/2023   HGB 13.8  12/15/2023   PLT 312 12/15/2023   NA 141 12/15/2023   K 4.1 12/15/2023   CL 105 12/15/2023   CO2 27 12/15/2023   GLUCOSE 99 12/15/2023   BUN 13 12/15/2023   CREATININE 0.67 12/15/2023   BILITOT 0.9 12/15/2023   ALKPHOS 79 01/02/2017   AST 22 12/15/2023   ALT 17 12/15/2023   PROT 7.4 12/15/2023   ALBUMIN 4.3 01/02/2017   CALCIUM  9.6 12/15/2023   GFRAA 116 08/25/2020   QFTBGOLDPLUS NEGATIVE 06/20/2023    Speciality Comments: Rinvoq  September 2021, inadequate response to methotrexate  and Humira   Procedures:  No procedures performed Allergies: Latex and Sulfa antibiotics   Assessment / Plan:  Visit Diagnoses: Rheumatoid arthritis involving multiple sites with positive rheumatoid factor (HCC) - Positive RF, positive anti-CCP, elevated ESR, erosive disease with nodulosis: Patient has been doing well on Rinvoq .  She had no synovitis on the examination.  Synovial thickening was noted in the wrist joints and MCP PIP joints.  No rheumatoid nodules were noted.  She has been taking Rinvoq  15 mg daily without any interruption which she has been tolerating well.  High risk medication use - Rinvoq  15 mg 1 tablet by mouth daily. D/c Rasuvo  due to clinically doing well. -December 15, 2023 CBC and CMP were normal.  TB Gold was negative on June 20, 2023.  She was advised to get labs in January and every 3 months to monitor for drug toxicity.  Information about immunization was placed in the AVS.  She was advised to hold Rinvoq  if she develops an infection resume after the infection resolves.  FDA blackbox warning of Mace events, stroke, cardiovascular death, DVTs and pulmonary embolism and lymphoma were discussed.  Increased risk for skin cancer was discussed.  Patient has been getting annual skin examination.  Will check TB Gold in April.  Plan: QuantiFERON-TB Gold Plus  Elevated cholesterol-she had high triglycerides in October 2025.  She is on Lipitor.  Rheumatoid nodule, right elbow (HCC) -  Resolved  Rheumatoid nodule, left elbow (HCC) - Resolved  S/P bunionectomy - bunionectomy by Dr. Kit in 2021.  Calcaneal spur of both feet-no symptomatic.  Chronic midline low back pain without sciatica-she states she has to be careful lifting objects but she has not had any lower back pain or sciatica.  History of hypertension-blood pressure was normal today.  She is on amlodipine  5 mg p.o. daily.  Increased risk of heart disease.  Rheumatoid arthritis was discussed.  Orders: Orders Placed This Encounter  Procedures   QuantiFERON-TB Gold Plus   No orders of the defined types were placed in this encounter.    Follow-Up Instructions: Return in about 5 months (around 07/10/2024) for Rheumatoid arthritis.   Maya Nash, MD  Note - This record has been created using Animal nutritionist.  Chart creation errors have been sought, but may not always  have been located. Such creation errors do not reflect on  the standard of medical care.

## 2024-02-10 ENCOUNTER — Encounter: Payer: Self-pay | Admitting: Rheumatology

## 2024-02-10 ENCOUNTER — Ambulatory Visit: Attending: Rheumatology | Admitting: Rheumatology

## 2024-02-10 VITALS — BP 125/86 | HR 73 | Temp 98.4°F | Resp 14 | Ht 62.0 in | Wt 138.0 lb

## 2024-02-10 DIAGNOSIS — M0579 Rheumatoid arthritis with rheumatoid factor of multiple sites without organ or systems involvement: Secondary | ICD-10-CM

## 2024-02-10 DIAGNOSIS — Z9889 Other specified postprocedural states: Secondary | ICD-10-CM

## 2024-02-10 DIAGNOSIS — M06322 Rheumatoid nodule, left elbow: Secondary | ICD-10-CM

## 2024-02-10 DIAGNOSIS — Z8679 Personal history of other diseases of the circulatory system: Secondary | ICD-10-CM

## 2024-02-10 DIAGNOSIS — G8929 Other chronic pain: Secondary | ICD-10-CM

## 2024-02-10 DIAGNOSIS — M06321 Rheumatoid nodule, right elbow: Secondary | ICD-10-CM

## 2024-02-10 DIAGNOSIS — M7731 Calcaneal spur, right foot: Secondary | ICD-10-CM

## 2024-02-10 DIAGNOSIS — E7800 Pure hypercholesterolemia, unspecified: Secondary | ICD-10-CM

## 2024-02-10 DIAGNOSIS — Z79899 Other long term (current) drug therapy: Secondary | ICD-10-CM

## 2024-02-10 NOTE — Patient Instructions (Addendum)
 Standing Labs We placed an order today for your standing lab work.   Please have your standing labs drawn in January and every 3 months  Please have your labs drawn 2 weeks prior to your appointment so that the provider can discuss your lab results at your appointment, if possible.  Please note that you may see your imaging and lab results in MyChart before we have reviewed them. We will contact you once all results are reviewed. Please allow our office up to 72 hours to thoroughly review all of the results before contacting the office for clarification of your results.  WALK-IN LAB HOURS  Monday through Thursday from 8:00 am - 4:30 pm and Friday from 8:00 am-12:00 pm.  Patients with office visits requiring labs will be seen before walk-in labs.  You may encounter longer than normal wait times. Please allow additional time. Wait times may be shorter on  Monday and Thursday afternoons.  We do not book appointments for walk-in labs. We appreciate your patience and understanding with our staff.   Labs are drawn by [facility]. Please bring your co-pay at the time of your lab draw.  You may receive a bill from [facility] for your lab work.  Please note if you are on Hydroxychloroquine and and an order has been placed for a Hydroxychloroquine level,  you will need to have it drawn 4 hours or more after your last dose.  If you wish to have your labs drawn at another location, please call the office 24 hours in advance so we can fax the orders.  The office is located at [address], [address], [place], [place] [address]   If you have any questions regarding directions or hours of operation,  please call [phone].   As a reminder, please drink plenty of water prior to coming for your lab work. Thanks!   Vaccines You are taking a medication(s) that can suppress your immune system.  The following immunizations are recommended: Flu annually Covid-19 RSV Td/Tdap (tetanus, diphtheria, pertussis)  every 10 years Pneumonia (Prevnar 15 then Pneumovax 23 at least 1 year apart.  Alternatively, can take Prevnar 20 without needing additional dose) Shingrix: 2 doses from 4 weeks to 6 months apart  Please check with your PCP to make sure you are up to date.   If you have signs or symptoms of an infection or start antibiotics: First, call your PCP for workup of your infection. Hold your medication through the infection, until you complete your antibiotics, and until symptoms resolve if you take the following: Injectable medication (Actemra, Benlysta, Cimzia, Cosentyx, Enbrel, Humira , Kevzara, Orencia, Remicade, Simponi, Stelara, Taltz, Tremfya) Methotrexate  Leflunomide (Arava) Mycophenolate (Cellcept) Xeljanz, Olumiant, or Rinvoq    Because you are taking Xeljanz, Rinvoq , or Olumiant, it is very important to know that this class of medications has a FDA BOXED WARNING for major adverse cardiovascular events (MACE), thrombosis, mortality (including sudden cardiovascular death), serious infections, and lymphomas. MACE is defined as cardiovascular death, myocardial infarction, and stroke. Thrombosis includes deep venous thrombosis (DVT), pulmonary embolism (PE), and arterial thrombosis. If you are a current or former smoker, you are at higher risk for these events   Please get an annual skin examination to screen for skin cancer while you are on Rinvoq .  Please use sunscreen and sun protection

## 2024-07-14 ENCOUNTER — Ambulatory Visit: Admitting: Physician Assistant
